# Patient Record
Sex: Female | Born: 1955
Health system: Southern US, Community
[De-identification: ages and names within clinical notes are randomized; demographics above are authoritative.]

## PROBLEM LIST (undated history)

## (undated) DIAGNOSIS — F419 Anxiety disorder, unspecified: Secondary | ICD-10-CM

## (undated) DIAGNOSIS — F329 Major depressive disorder, single episode, unspecified: Secondary | ICD-10-CM

## (undated) DIAGNOSIS — E785 Hyperlipidemia, unspecified: Secondary | ICD-10-CM

## (undated) DIAGNOSIS — K219 Gastro-esophageal reflux disease without esophagitis: Secondary | ICD-10-CM

## (undated) DIAGNOSIS — F32A Depression, unspecified: Secondary | ICD-10-CM

## (undated) DIAGNOSIS — I10 Essential (primary) hypertension: Secondary | ICD-10-CM

## (undated) HISTORY — DX: Depression, unspecified: F32.A

## (undated) HISTORY — DX: Hyperlipidemia, unspecified: E78.5

## (undated) HISTORY — DX: Major depressive disorder, single episode, unspecified: F32.9

## (undated) HISTORY — DX: Gastro-esophageal reflux disease without esophagitis: K21.9

## (undated) HISTORY — DX: Anxiety disorder, unspecified: F41.9

## (undated) HISTORY — DX: Morbid (severe) obesity due to excess calories: E66.01

## (undated) HISTORY — DX: Essential (primary) hypertension: I10

---

## 1998-02-04 ENCOUNTER — Ambulatory Visit (HOSPITAL_COMMUNITY): Admission: RE | Admit: 1998-02-04 | Discharge: 1998-02-04 | Payer: Self-pay | Admitting: Family Medicine

## 1998-05-31 ENCOUNTER — Emergency Department (HOSPITAL_COMMUNITY): Admission: EM | Admit: 1998-05-31 | Discharge: 1998-05-31 | Payer: Self-pay | Admitting: Emergency Medicine

## 1998-11-09 ENCOUNTER — Encounter: Admission: RE | Admit: 1998-11-09 | Discharge: 1998-12-07 | Payer: Self-pay | Admitting: Internal Medicine

## 1999-01-07 ENCOUNTER — Encounter: Payer: Self-pay | Admitting: Neurosurgery

## 1999-01-08 ENCOUNTER — Inpatient Hospital Stay (HOSPITAL_COMMUNITY): Admission: RE | Admit: 1999-01-08 | Discharge: 1999-01-10 | Payer: Self-pay | Admitting: Neurosurgery

## 1999-01-08 ENCOUNTER — Encounter: Payer: Self-pay | Admitting: Neurosurgery

## 1999-04-03 ENCOUNTER — Ambulatory Visit (HOSPITAL_COMMUNITY): Admission: RE | Admit: 1999-04-03 | Discharge: 1999-04-03 | Payer: Self-pay | Admitting: Neurosurgery

## 1999-04-03 ENCOUNTER — Encounter: Payer: Self-pay | Admitting: Neurosurgery

## 1999-04-09 ENCOUNTER — Encounter: Admission: RE | Admit: 1999-04-09 | Discharge: 1999-07-08 | Payer: Self-pay | Admitting: *Deleted

## 1999-07-01 ENCOUNTER — Encounter: Admission: RE | Admit: 1999-07-01 | Discharge: 1999-07-01 | Payer: Self-pay | Admitting: Obstetrics

## 1999-07-28 ENCOUNTER — Encounter: Payer: Self-pay | Admitting: Obstetrics

## 1999-07-28 ENCOUNTER — Ambulatory Visit (HOSPITAL_COMMUNITY): Admission: RE | Admit: 1999-07-28 | Discharge: 1999-07-28 | Payer: Self-pay

## 1999-09-09 ENCOUNTER — Encounter: Admission: RE | Admit: 1999-09-09 | Discharge: 1999-09-09 | Payer: Self-pay | Admitting: Obstetrics

## 1999-09-28 ENCOUNTER — Ambulatory Visit (HOSPITAL_COMMUNITY): Admission: RE | Admit: 1999-09-28 | Discharge: 1999-09-28 | Payer: Self-pay | Admitting: Neurosurgery

## 1999-09-28 ENCOUNTER — Encounter: Payer: Self-pay | Admitting: Neurosurgery

## 1999-10-28 ENCOUNTER — Encounter: Payer: Self-pay | Admitting: Neurosurgery

## 1999-10-28 ENCOUNTER — Ambulatory Visit (HOSPITAL_COMMUNITY): Admission: RE | Admit: 1999-10-28 | Discharge: 1999-10-28 | Payer: Self-pay | Admitting: Neurosurgery

## 1999-12-01 ENCOUNTER — Ambulatory Visit (HOSPITAL_COMMUNITY): Admission: RE | Admit: 1999-12-01 | Discharge: 1999-12-03 | Payer: Self-pay | Admitting: Neurosurgery

## 1999-12-01 ENCOUNTER — Encounter: Payer: Self-pay | Admitting: Neurosurgery

## 1999-12-28 ENCOUNTER — Encounter: Admission: RE | Admit: 1999-12-28 | Discharge: 2000-03-27 | Payer: Self-pay | Admitting: Family Medicine

## 2000-01-24 ENCOUNTER — Other Ambulatory Visit: Admission: RE | Admit: 2000-01-24 | Discharge: 2000-01-24 | Payer: Self-pay | Admitting: Obstetrics and Gynecology

## 2000-05-13 ENCOUNTER — Emergency Department (HOSPITAL_COMMUNITY): Admission: EM | Admit: 2000-05-13 | Discharge: 2000-05-13 | Payer: Self-pay | Admitting: Emergency Medicine

## 2000-05-30 ENCOUNTER — Encounter: Payer: Self-pay | Admitting: Neurosurgery

## 2000-05-30 ENCOUNTER — Ambulatory Visit (HOSPITAL_COMMUNITY): Admission: RE | Admit: 2000-05-30 | Discharge: 2000-05-30 | Payer: Self-pay | Admitting: Neurosurgery

## 2000-06-21 ENCOUNTER — Encounter: Admission: RE | Admit: 2000-06-21 | Discharge: 2000-06-26 | Payer: Self-pay | Admitting: Neurosurgery

## 2000-08-15 ENCOUNTER — Encounter: Admission: RE | Admit: 2000-08-15 | Discharge: 2000-11-13 | Payer: Self-pay | Admitting: Family Medicine

## 2000-09-24 ENCOUNTER — Encounter: Payer: Self-pay | Admitting: Emergency Medicine

## 2000-09-24 ENCOUNTER — Inpatient Hospital Stay (HOSPITAL_COMMUNITY): Admission: EM | Admit: 2000-09-24 | Discharge: 2000-09-24 | Payer: Self-pay | Admitting: Emergency Medicine

## 2000-11-28 HISTORY — PX: CERVICAL DISCECTOMY: SHX98

## 2001-07-29 ENCOUNTER — Emergency Department (HOSPITAL_COMMUNITY): Admission: EM | Admit: 2001-07-29 | Discharge: 2001-07-29 | Payer: Self-pay | Admitting: *Deleted

## 2002-04-10 ENCOUNTER — Ambulatory Visit (HOSPITAL_COMMUNITY): Admission: RE | Admit: 2002-04-10 | Discharge: 2002-04-10 | Payer: Self-pay | Admitting: Internal Medicine

## 2002-04-10 ENCOUNTER — Encounter: Payer: Self-pay | Admitting: Internal Medicine

## 2004-05-08 ENCOUNTER — Emergency Department (HOSPITAL_COMMUNITY): Admission: EM | Admit: 2004-05-08 | Discharge: 2004-05-08 | Payer: Self-pay | Admitting: Emergency Medicine

## 2004-08-04 ENCOUNTER — Ambulatory Visit: Payer: Self-pay | Admitting: Internal Medicine

## 2004-08-11 ENCOUNTER — Ambulatory Visit: Payer: Self-pay | Admitting: Internal Medicine

## 2004-08-25 ENCOUNTER — Ambulatory Visit: Payer: Self-pay | Admitting: Internal Medicine

## 2004-09-07 ENCOUNTER — Ambulatory Visit: Payer: Self-pay | Admitting: Internal Medicine

## 2004-09-08 ENCOUNTER — Ambulatory Visit (HOSPITAL_COMMUNITY): Admission: RE | Admit: 2004-09-08 | Discharge: 2004-09-08 | Payer: Self-pay | Admitting: Pulmonary Disease

## 2004-10-07 ENCOUNTER — Emergency Department (HOSPITAL_COMMUNITY): Admission: EM | Admit: 2004-10-07 | Discharge: 2004-10-07 | Payer: Self-pay | Admitting: Family Medicine

## 2004-12-27 ENCOUNTER — Emergency Department (HOSPITAL_COMMUNITY): Admission: EM | Admit: 2004-12-27 | Discharge: 2004-12-27 | Payer: Self-pay | Admitting: Family Medicine

## 2005-01-06 ENCOUNTER — Ambulatory Visit (HOSPITAL_COMMUNITY): Admission: RE | Admit: 2005-01-06 | Discharge: 2005-01-06 | Payer: Self-pay | Admitting: Internal Medicine

## 2005-01-06 ENCOUNTER — Ambulatory Visit: Payer: Self-pay | Admitting: Internal Medicine

## 2005-01-14 ENCOUNTER — Ambulatory Visit (HOSPITAL_COMMUNITY): Admission: RE | Admit: 2005-01-14 | Discharge: 2005-01-14 | Payer: Self-pay | Admitting: Internal Medicine

## 2005-01-20 ENCOUNTER — Ambulatory Visit: Payer: Self-pay | Admitting: Internal Medicine

## 2006-02-08 ENCOUNTER — Emergency Department (HOSPITAL_COMMUNITY): Admission: EM | Admit: 2006-02-08 | Discharge: 2006-02-09 | Payer: Self-pay | Admitting: Emergency Medicine

## 2006-02-08 ENCOUNTER — Ambulatory Visit: Payer: Self-pay | Admitting: Internal Medicine

## 2006-02-10 ENCOUNTER — Ambulatory Visit: Payer: Self-pay | Admitting: Internal Medicine

## 2006-02-13 ENCOUNTER — Ambulatory Visit (HOSPITAL_COMMUNITY): Admission: RE | Admit: 2006-02-13 | Discharge: 2006-02-13 | Payer: Self-pay | Admitting: Internal Medicine

## 2006-02-13 ENCOUNTER — Encounter (INDEPENDENT_AMBULATORY_CARE_PROVIDER_SITE_OTHER): Payer: Self-pay | Admitting: Pulmonary Disease

## 2006-02-14 ENCOUNTER — Ambulatory Visit (HOSPITAL_COMMUNITY): Admission: RE | Admit: 2006-02-14 | Discharge: 2006-02-14 | Payer: Self-pay | Admitting: Internal Medicine

## 2006-02-16 ENCOUNTER — Ambulatory Visit: Payer: Self-pay | Admitting: Hospitalist

## 2006-02-20 ENCOUNTER — Ambulatory Visit (HOSPITAL_COMMUNITY): Admission: RE | Admit: 2006-02-20 | Discharge: 2006-02-20 | Payer: Self-pay | Admitting: Internal Medicine

## 2006-03-10 ENCOUNTER — Ambulatory Visit: Payer: Self-pay | Admitting: Internal Medicine

## 2006-03-17 ENCOUNTER — Ambulatory Visit: Payer: Self-pay | Admitting: Hospitalist

## 2006-03-21 ENCOUNTER — Ambulatory Visit: Payer: Self-pay | Admitting: Internal Medicine

## 2006-06-01 ENCOUNTER — Ambulatory Visit: Payer: Self-pay | Admitting: Internal Medicine

## 2006-09-25 DIAGNOSIS — K449 Diaphragmatic hernia without obstruction or gangrene: Secondary | ICD-10-CM | POA: Insufficient documentation

## 2006-09-25 DIAGNOSIS — R1319 Other dysphagia: Secondary | ICD-10-CM | POA: Insufficient documentation

## 2006-09-25 DIAGNOSIS — J4540 Moderate persistent asthma, uncomplicated: Secondary | ICD-10-CM

## 2006-09-25 DIAGNOSIS — K219 Gastro-esophageal reflux disease without esophagitis: Secondary | ICD-10-CM | POA: Insufficient documentation

## 2006-09-25 DIAGNOSIS — J45998 Other asthma: Secondary | ICD-10-CM | POA: Insufficient documentation

## 2006-09-25 DIAGNOSIS — I10 Essential (primary) hypertension: Secondary | ICD-10-CM | POA: Insufficient documentation

## 2006-09-25 DIAGNOSIS — J329 Chronic sinusitis, unspecified: Secondary | ICD-10-CM | POA: Insufficient documentation

## 2006-09-25 DIAGNOSIS — J309 Allergic rhinitis, unspecified: Secondary | ICD-10-CM | POA: Insufficient documentation

## 2006-09-25 DIAGNOSIS — E7800 Pure hypercholesterolemia, unspecified: Secondary | ICD-10-CM | POA: Insufficient documentation

## 2006-09-25 DIAGNOSIS — F329 Major depressive disorder, single episode, unspecified: Secondary | ICD-10-CM | POA: Insufficient documentation

## 2006-11-13 ENCOUNTER — Ambulatory Visit: Payer: Self-pay | Admitting: Internal Medicine

## 2006-11-27 ENCOUNTER — Ambulatory Visit: Payer: Self-pay | Admitting: Internal Medicine

## 2007-02-27 ENCOUNTER — Telehealth (INDEPENDENT_AMBULATORY_CARE_PROVIDER_SITE_OTHER): Payer: Self-pay | Admitting: *Deleted

## 2007-03-08 ENCOUNTER — Encounter (INDEPENDENT_AMBULATORY_CARE_PROVIDER_SITE_OTHER): Payer: Self-pay | Admitting: Pulmonary Disease

## 2007-03-08 ENCOUNTER — Encounter (INDEPENDENT_AMBULATORY_CARE_PROVIDER_SITE_OTHER): Payer: Self-pay | Admitting: *Deleted

## 2007-03-08 ENCOUNTER — Ambulatory Visit: Payer: Self-pay | Admitting: Internal Medicine

## 2007-03-08 LAB — CONVERTED CEMR LAB
BUN: 20 mg/dL (ref 6–23)
Chloride: 103 meq/L (ref 96–112)
Glucose, Bld: 125 mg/dL — ABNORMAL HIGH (ref 70–99)
Sodium: 140 meq/L (ref 135–145)

## 2007-03-12 ENCOUNTER — Encounter: Payer: Self-pay | Admitting: *Deleted

## 2007-03-12 ENCOUNTER — Ambulatory Visit (HOSPITAL_COMMUNITY): Admission: RE | Admit: 2007-03-12 | Discharge: 2007-03-12 | Payer: Self-pay | Admitting: Internal Medicine

## 2007-03-22 ENCOUNTER — Ambulatory Visit: Payer: Self-pay | Admitting: Internal Medicine

## 2007-04-02 ENCOUNTER — Encounter (INDEPENDENT_AMBULATORY_CARE_PROVIDER_SITE_OTHER): Payer: Self-pay | Admitting: Pulmonary Disease

## 2007-04-02 ENCOUNTER — Ambulatory Visit (HOSPITAL_BASED_OUTPATIENT_CLINIC_OR_DEPARTMENT_OTHER): Admission: RE | Admit: 2007-04-02 | Discharge: 2007-04-02 | Payer: Self-pay | Admitting: Pulmonary Disease

## 2007-04-07 ENCOUNTER — Ambulatory Visit: Payer: Self-pay | Admitting: Internal Medicine

## 2007-04-12 ENCOUNTER — Telehealth: Payer: Self-pay | Admitting: *Deleted

## 2007-04-27 ENCOUNTER — Telehealth (INDEPENDENT_AMBULATORY_CARE_PROVIDER_SITE_OTHER): Payer: Self-pay | Admitting: Pulmonary Disease

## 2007-05-22 ENCOUNTER — Telehealth: Payer: Self-pay | Admitting: *Deleted

## 2007-06-03 ENCOUNTER — Emergency Department (HOSPITAL_COMMUNITY): Admission: EM | Admit: 2007-06-03 | Discharge: 2007-06-03 | Payer: Self-pay | Admitting: Emergency Medicine

## 2007-08-15 ENCOUNTER — Ambulatory Visit: Payer: Self-pay | Admitting: Internal Medicine

## 2007-08-15 ENCOUNTER — Encounter (INDEPENDENT_AMBULATORY_CARE_PROVIDER_SITE_OTHER): Payer: Self-pay | Admitting: Internal Medicine

## 2007-08-15 DIAGNOSIS — N898 Other specified noninflammatory disorders of vagina: Secondary | ICD-10-CM | POA: Insufficient documentation

## 2007-08-15 LAB — CONVERTED CEMR LAB
Gardnerella vaginalis: NEGATIVE
Trichomonal Vaginitis: NEGATIVE

## 2007-08-29 ENCOUNTER — Ambulatory Visit: Payer: Self-pay | Admitting: Hospitalist

## 2007-08-29 ENCOUNTER — Encounter (INDEPENDENT_AMBULATORY_CARE_PROVIDER_SITE_OTHER): Payer: Self-pay | Admitting: Internal Medicine

## 2007-08-30 LAB — CONVERTED CEMR LAB
AST: 18 units/L (ref 0–37)
BUN: 18 mg/dL (ref 6–23)
Chloride: 104 meq/L (ref 96–112)
Cholesterol: 201 mg/dL — ABNORMAL HIGH (ref 0–200)
HCT: 33 % — ABNORMAL LOW (ref 36.0–46.0)
Hemoglobin: 11.1 g/dL — ABNORMAL LOW (ref 12.0–15.0)
Platelets: 351 10*3/uL (ref 150–400)
Potassium: 3.7 meq/L (ref 3.5–5.3)
RBC: 4.08 M/uL (ref 3.87–5.11)
Total Bilirubin: 0.6 mg/dL (ref 0.3–1.2)
Total CHOL/HDL Ratio: 2.5
Total Protein: 7.9 g/dL (ref 6.0–8.3)
Triglycerides: 61 mg/dL (ref <150)
VLDL: 12 mg/dL (ref 0–40)
WBC: 4.9 10*3/uL (ref 4.0–10.5)

## 2007-09-17 ENCOUNTER — Ambulatory Visit: Payer: Self-pay | Admitting: *Deleted

## 2007-09-17 ENCOUNTER — Encounter (INDEPENDENT_AMBULATORY_CARE_PROVIDER_SITE_OTHER): Payer: Self-pay | Admitting: Internal Medicine

## 2007-09-17 DIAGNOSIS — D649 Anemia, unspecified: Secondary | ICD-10-CM | POA: Insufficient documentation

## 2007-09-17 LAB — CONVERTED CEMR LAB
Ferritin: 53 ng/mL (ref 10–291)
RBC Folate: 337 ng/mL (ref 180–600)
Saturation Ratios: 13 % — ABNORMAL LOW (ref 20–55)
UIBC: 302 ug/dL
Vitamin B-12: 1423 pg/mL — ABNORMAL HIGH (ref 211–911)

## 2007-11-15 ENCOUNTER — Telehealth: Payer: Self-pay | Admitting: *Deleted

## 2007-12-14 ENCOUNTER — Telehealth (INDEPENDENT_AMBULATORY_CARE_PROVIDER_SITE_OTHER): Payer: Self-pay | Admitting: Pharmacy Technician

## 2007-12-19 ENCOUNTER — Telehealth (INDEPENDENT_AMBULATORY_CARE_PROVIDER_SITE_OTHER): Payer: Self-pay | Admitting: Internal Medicine

## 2008-02-19 ENCOUNTER — Telehealth (INDEPENDENT_AMBULATORY_CARE_PROVIDER_SITE_OTHER): Payer: Self-pay | Admitting: Internal Medicine

## 2008-03-21 ENCOUNTER — Telehealth: Payer: Self-pay | Admitting: *Deleted

## 2008-03-25 ENCOUNTER — Telehealth (INDEPENDENT_AMBULATORY_CARE_PROVIDER_SITE_OTHER): Payer: Self-pay | Admitting: Internal Medicine

## 2008-03-27 ENCOUNTER — Ambulatory Visit (HOSPITAL_COMMUNITY): Admission: RE | Admit: 2008-03-27 | Discharge: 2008-03-27 | Payer: Self-pay | Admitting: Pulmonary Disease

## 2008-04-15 ENCOUNTER — Ambulatory Visit: Payer: Self-pay | Admitting: Internal Medicine

## 2008-04-29 ENCOUNTER — Ambulatory Visit: Payer: Self-pay | Admitting: Internal Medicine

## 2008-04-29 ENCOUNTER — Encounter (INDEPENDENT_AMBULATORY_CARE_PROVIDER_SITE_OTHER): Payer: Self-pay | Admitting: Internal Medicine

## 2008-04-30 LAB — CONVERTED CEMR LAB
ALT: 19 units/L (ref 0–35)
Albumin: 4.2 g/dL (ref 3.5–5.2)
Alkaline Phosphatase: 123 units/L — ABNORMAL HIGH (ref 39–117)
BUN: 20 mg/dL (ref 6–23)
Calcium: 9.7 mg/dL (ref 8.4–10.5)
Cholesterol: 225 mg/dL — ABNORMAL HIGH (ref 0–200)
HDL: 75 mg/dL (ref >39)
Hemoglobin: 11.6 g/dL — ABNORMAL LOW (ref 12.0–15.0)
LDL Cholesterol: 137 mg/dL — ABNORMAL HIGH (ref 0–99)
MCHC: 34.6 g/dL (ref 30.0–36.0)
Potassium: 3.6 meq/L (ref 3.5–5.3)
RBC: 4.17 M/uL (ref 3.87–5.11)
RDW: 13.9 % (ref 11.5–15.5)
Sodium: 142 meq/L (ref 135–145)
TSH: 1.334 microintl units/mL (ref 0.350–5.50)
Total CHOL/HDL Ratio: 3
Total Protein: 8 g/dL (ref 6.0–8.3)
Triglycerides: 65 mg/dL (ref <150)
VLDL: 13 mg/dL (ref 0–40)

## 2008-05-06 ENCOUNTER — Telehealth (INDEPENDENT_AMBULATORY_CARE_PROVIDER_SITE_OTHER): Payer: Self-pay | Admitting: Internal Medicine

## 2008-05-07 ENCOUNTER — Telehealth (INDEPENDENT_AMBULATORY_CARE_PROVIDER_SITE_OTHER): Payer: Self-pay | Admitting: Internal Medicine

## 2008-08-07 ENCOUNTER — Encounter (INDEPENDENT_AMBULATORY_CARE_PROVIDER_SITE_OTHER): Payer: Self-pay | Admitting: Internal Medicine

## 2008-10-20 ENCOUNTER — Telehealth: Payer: Self-pay | Admitting: *Deleted

## 2008-10-21 ENCOUNTER — Telehealth: Payer: Self-pay | Admitting: *Deleted

## 2008-11-05 ENCOUNTER — Telehealth: Payer: Self-pay | Admitting: *Deleted

## 2008-12-01 ENCOUNTER — Telehealth: Payer: Self-pay | Admitting: *Deleted

## 2008-12-30 ENCOUNTER — Telehealth (INDEPENDENT_AMBULATORY_CARE_PROVIDER_SITE_OTHER): Payer: Self-pay | Admitting: Internal Medicine

## 2009-02-18 ENCOUNTER — Ambulatory Visit: Payer: Self-pay | Admitting: Internal Medicine

## 2009-02-18 ENCOUNTER — Encounter (INDEPENDENT_AMBULATORY_CARE_PROVIDER_SITE_OTHER): Payer: Self-pay | Admitting: Internal Medicine

## 2009-04-10 ENCOUNTER — Encounter: Payer: Self-pay | Admitting: Obstetrics & Gynecology

## 2009-04-10 ENCOUNTER — Ambulatory Visit: Payer: Self-pay | Admitting: Obstetrics & Gynecology

## 2009-04-14 ENCOUNTER — Ambulatory Visit: Payer: Self-pay | Admitting: *Deleted

## 2009-04-14 ENCOUNTER — Telehealth (INDEPENDENT_AMBULATORY_CARE_PROVIDER_SITE_OTHER): Payer: Self-pay | Admitting: *Deleted

## 2009-04-14 DIAGNOSIS — R059 Cough, unspecified: Secondary | ICD-10-CM | POA: Insufficient documentation

## 2009-04-14 DIAGNOSIS — R05 Cough: Secondary | ICD-10-CM | POA: Insufficient documentation

## 2009-07-15 ENCOUNTER — Telehealth: Payer: Self-pay | Admitting: *Deleted

## 2009-07-30 ENCOUNTER — Telehealth (INDEPENDENT_AMBULATORY_CARE_PROVIDER_SITE_OTHER): Payer: Self-pay | Admitting: *Deleted

## 2009-08-13 ENCOUNTER — Telehealth (INDEPENDENT_AMBULATORY_CARE_PROVIDER_SITE_OTHER): Payer: Self-pay | Admitting: *Deleted

## 2009-11-13 ENCOUNTER — Telehealth: Payer: Self-pay | Admitting: *Deleted

## 2010-02-03 ENCOUNTER — Ambulatory Visit: Payer: Self-pay | Admitting: Internal Medicine

## 2010-02-03 DIAGNOSIS — D239 Other benign neoplasm of skin, unspecified: Secondary | ICD-10-CM | POA: Insufficient documentation

## 2010-02-03 DIAGNOSIS — J029 Acute pharyngitis, unspecified: Secondary | ICD-10-CM | POA: Insufficient documentation

## 2010-02-03 LAB — CONVERTED CEMR LAB
BUN: 16 mg/dL (ref 6–23)
Calcium: 9.2 mg/dL (ref 8.4–10.5)
Creatinine, Ser: 0.86 mg/dL (ref 0.40–1.20)
Potassium: 3.9 meq/L (ref 3.5–5.3)
Sodium: 143 meq/L (ref 135–145)

## 2010-03-18 ENCOUNTER — Telehealth (INDEPENDENT_AMBULATORY_CARE_PROVIDER_SITE_OTHER): Payer: Self-pay | Admitting: Dermatology

## 2010-11-09 ENCOUNTER — Telehealth: Payer: Self-pay | Admitting: Internal Medicine

## 2010-11-12 ENCOUNTER — Emergency Department (HOSPITAL_COMMUNITY)
Admission: EM | Admit: 2010-11-12 | Discharge: 2010-11-12 | Payer: Self-pay | Source: Home / Self Care | Admitting: Emergency Medicine

## 2010-12-28 NOTE — Progress Notes (Signed)
Summary: refill/gg  Phone Note Refill Request  on March 18, 2010 4:53 PM  Refills Requested: Medication #1:  CELEBREX 100 MG  CAPS Take 1 tablet by mouth once a day   Last Refilled: 02/22/2010  Method Requested: Fax to Local Pharmacy Initial call taken by: Merrie Roof RN,  March 18, 2010 4:53 PM  Follow-up for Phone Call        Rx faxed to pharmacy    Prescriptions: CELEBREX 100 MG  CAPS (CELECOXIB) Take 1 tablet by mouth once a day  #30 x 5   Entered and Authorized by:   Aris Lot MD   Signed by:   Aris Lot MD on 03/19/2010   Method used:   [facility]ed to ...       [facility] [address] [id number]       [address]       [place], [place]  [address]       Ph: [phone]       Fax: [phone]   RxID:   [id number]

## 2010-12-28 NOTE — Assessment & Plan Note (Signed)
Summary: sore throat [mkj]   Vital Signs:  Patient profile:   55 year old female Height:      68 inches (172.72 cm) Weight:      310.5 pounds (141.14 kg) BMI:     47.38 Temp:     97.0 degrees F (36.11 degrees C) oral Pulse rate:   84 / minute BP sitting:   149 / 88  (right arm) Cuff size:   large  Vitals Entered By: Theotis Barrio NT II (February 03, 2010 3:36 PM)  Nutrition Counseling: Patient's BMI is greater than 25 and therefore counseled on weight management options. CC: SORE THROAT -BETTER TODAY   / BLACK SPOT -BOTTOM OF RIGHT FOOT -PALM AREA OF LEFT HAND  /  MEDICATION REFILL Is Patient Diabetic? No Pain Assessment Patient in pain? no      Nutritional Status BMI of > 30 = obese  Have you ever been in a relationship where you felt threatened, hurt or afraid?No   Does patient need assistance? Functional Status Self care Ambulation Normal Comments SORE THROAT- BETTER TODAY THAN YESTERDAY / MEDICATION REIFILL /  BLACK SPOT BOTTOM OF RIGHT FOOT / BLACK SPOT PALM AREA OF LEFT AND   Primary Care Provider:  Carlus Pavlov MD  CC:  SORE THROAT -BETTER TODAY   / BLACK SPOT -BOTTOM OF RIGHT FOOT -PALM AREA OF LEFT HAND  /  MEDICATION REFILL.  History of Present Illness: 55 yo woman with problems as listed on the EMR. In here today for:   1. sore throat: started 3-4 days ago and already getting better. No fever or cough. No problem with speaking or swallowing.  2. skin lesions: she has blackish lesions on palm of left hand and left sole. It looks like a mole and seems to be getting larger.    Depression History:      The patient denies a depressed mood most of the day and a diminished interest in her usual daily activities.         Preventive Screening-Counseling & Management  Alcohol-Tobacco     Smoking Status: never  Caffeine-Diet-Exercise     Does Patient Exercise: no  Current Medications (verified): 1)  Flovent Hfa 110 Mcg/act Aero (Fluticasone Propionate  Hfa)  .... One Puff Two Times A Day 2)  Hydrochlorothiazide 25 Mg Tabs (Hydrochlorothiazide) .... Take 1 Tablet By Mouth Once A Day 3)  Allegra 60 Mg  Tabs (Fexofenadine Hcl) .... Take 1 Tablet By Mouth Once A Day 4)  Flonase  Susp (Fluticasone Propionate Susp) .... Dose/frequency Not in Chart 5)  Nexium 40 Mg Cpdr (Esomeprazole Magnesium) .... Take 1 Tablet By Mouth Once A Day 6)  Tiazac 240 Mg  Cp24 (Diltiazem Hcl Er Beads) .... Take 1 Tablet By Mouth Once A Day 7)  Celebrex 100 Mg  Caps (Celecoxib) .... Take 1 Tablet By Mouth Once A Day 8)  Lisinopril 5 Mg Tabs (Lisinopril) .Marland Kitchen.. 1 Tablet By Mouth Daily 9)  Guaifenesin 200 Mg Tabs (Guaifenesin) .... Take 1 Tab By Mouth Up To Every 6 Hours As Needed Cough  Allergies (verified): 1)  ! Aspirin (Aspirin)  Past History:  Past Medical History: Last updated: 04/14/2009 Anxiety Depression-situational Allergic rhinitis Asthma GERD Hypertension  SBP: 144 (02/18/2009 1:34:02 PM) 146 (04/15/2008 3:43:26 PM) 128 (09/17/2007 2:49:05 PM) 138 (08/15/2007 11:52:01 AM) 131 (03/22/2007 2:30:37 PM)   Sinusitis Obesity Dysphagia Hiatal hernia Skin nodules Hypercholesterolemia  Past Surgical History: Last updated: 09/25/2006 Diskectomy, 2002   Family History: Last  updated: 04/14/2009 F: died of MI at 56 M: died in early 77s, not sure of cause, had HTN 2 brothers: HTN 5 children: eldest daughter with HTN Sickle cell trait in family  Social History: Last updated: 04/14/2009 Lives in [place] in a house with her daughter 82 year old. never smoker no alcohol no drugs  Risk Factors: Exercise: no (02/03/2010)  Risk Factors: Smoking Status: never (02/03/2010)  Review of Systems       as per HPI  Physical Exam  General:  alert and overweight-appearing.   Mouth:  pharynx pink and moist, no erythema, and no exudates.   Neck:  supple.   Lungs:  normal respiratory effort and normal breath sounds.   Heart:  normal rate and regular  rhythm.   Abdomen:  soft and non-tender.   Pulses:  normal peripheral pulses  Extremities:  no cyanosis, clubbing or edema  Neurologic:  non focal.  Psych:  Oriented X3 and normally interactive.     Impression & Recommendations:  Problem # 1:  SORE THROAT (ICD-462) this was likely viral. already improving. exam was negative. I will treat symptomatically.  Her updated medication list for this problem includes:    Celebrex 100 Mg Caps (Celecoxib) .Marland Kitchen... Take 1 tablet by mouth once a day  Problem # 2:  NEVUS (ICD-216.9) Per pt, size is growing. Will make a derm referral.   Orders: Dermatology Referral (Derma)  Problem # 3:  HYPERTENSION (ICD-401.9) High BP noted, patient says that she is taking HCTZ and tiazac, but not lisinopril. I will get a b-met today and ask her to take lisinopril. Will have her restart her lisinorpril, review next time.  Her updated medication list for this problem includes:    Hydrochlorothiazide 25 Mg Tabs (Hydrochlorothiazide) .Marland Kitchen... Take 1 tablet by mouth once a day    Tiazac 240 Mg Cp24 (Diltiazem hcl er beads) .Marland Kitchen... Take 1 tablet by mouth once a day    Lisinopril 5 Mg Tabs (Lisinopril) .Marland Kitchen... 1 tablet by mouth daily  Orders: T-Basic Metabolic Panel [phone])  Problem # 4:  HYPERCHOLESTEROLEMIA (ICD-272.0) Chol: 225 (04/29/2008 8:43:00 PM)HDL:  75 (04/29/2008 8:43:00 PM)LDL:  137 (04/29/2008 8:43:00 PM)Tri:   AST:  22 (04/29/2008 8:43:00 PM) ALT:  19 (04/29/2008 8:43:00 PM)T. Bili:  0.5 (04/29/2008 8:43:00 PM) AP:  123 (04/29/2008 8:43:00 PM)  Data reviewed. She will need repeat FLP on next visit.   Complete Medication List: 1)  Flovent Hfa 110 Mcg/act Aero (Fluticasone propionate  hfa) .... One puff two times a day 2)  Hydrochlorothiazide 25 Mg Tabs (Hydrochlorothiazide) .... Take 1 tablet by mouth once a day 3)  Allegra 60 Mg Tabs (Fexofenadine hcl) .... Take 1 tablet by mouth once a day 4)  Flonase Susp (Fluticasone propionate susp) ....  Dose/frequency not in chart 5)  Nexium 40 Mg Cpdr (Esomeprazole magnesium) .... Take 1 tablet by mouth once a day 6)  Tiazac 240 Mg Cp24 (Diltiazem hcl er beads) .... Take 1 tablet by mouth once a day 7)  Celebrex 100 Mg Caps (Celecoxib) .... Take 1 tablet by mouth once a day 8)  Lisinopril 5 Mg Tabs (Lisinopril) .Marland Kitchen.. 1 tablet by mouth daily 9)  Guaifenesin 200 Mg Tabs (Guaifenesin) .... Take 1 tab by mouth up to every 6 hours as needed cough  Other Orders: Mammogram (Screening) (Mammo)  Patient Instructions: 1)  Please follow up with the skin doctor for your skin problem. 2)  Do let us know if your problem worsens.  3)  We will let you know if anything wrong with your lab work.   4)  Please pick up your meds from your pharmacy, refills have been done.  Prescriptions: LISINOPRIL 5 MG TABS (LISINOPRIL) 1 tablet by mouth daily  #30 x 1   Entered and Authorized by:   Zara Council MD   Signed by:   Zara Council MD on 02/03/2010   Method used:   Print then Give to Patient   RxID:   [id number] TIAZAC 240 MG  CP24 (DILTIAZEM HCL ER BEADS) Take 1 tablet by mouth once a day  #30 x 5   Entered and Authorized by:   Zara Council MD   Signed by:   Zara Council MD on 02/03/2010   Method used:   Print then Give to Patient   RxID:   [id number] HYDROCHLOROTHIAZIDE 25 MG TABS (HYDROCHLOROTHIAZIDE) Take 1 tablet by mouth once a day  #31 x 11   Entered and Authorized by:   Zara Council MD   Signed by:   Zara Council MD on 02/03/2010   Method used:   Print then Give to Patient   RxID:   [id number]  Process Orders Check Orders Results:     [facility]: ABN not required for this insurance Tests Sent for requisitioning (February 03, 2010 8:53 PM):     02/03/2010: [facility] -- T-Basic Metabolic Panel [phone] (signed)   Prevention & Chronic Care Immunizations   Influenza vaccine: Fluvax Non-MCR  (09/17/2007)    Tetanus booster: Not  documented    Pneumococcal vaccine: Not documented  Colorectal Screening   Hemoccult: Not documented    Colonoscopy: Not documented  Other Screening   Pap smear: Not documented    Mammogram: No specific mammographic evidence of malignancy.  Assessment: BIRADS 1.   (03/27/2008)   Mammogram due: 03/2009   Smoking status: never  (02/03/2010)  Lipids   Total Cholesterol: 225  (04/29/2008)   LDL: 137  (04/29/2008)   LDL Direct: Not documented   HDL: 75  (04/29/2008)   Triglycerides: 65  (04/29/2008)    SGOT (AST): 22  (04/29/2008)   SGPT (ALT): 19  (04/29/2008)   Alkaline phosphatase: 123  (04/29/2008)   Total bilirubin: 0.5  (04/29/2008)    Lipid flowsheet reviewed?: Yes   Progress toward LDL goal: Unchanged  Hypertension   Last Blood Pressure: 149 / 88  (02/03/2010)   Serum creatinine: 0.81  (04/29/2008)   Serum potassium 3.6  (04/29/2008)    Hypertension flowsheet reviewed?: Yes   Progress toward BP goal: Unchanged  Self-Management Support :   Personal Goals (by the next clinic visit) :      Personal blood pressure goal: 140/90  (02/03/2010)     Personal LDL goal: 100  (02/03/2010)    Patient will work on the following items until the next clinic visit to reach self-care goals:     Medications and monitoring: take my medicines every day, bring all of my medications to every visit  (02/03/2010)     Eating: eat more vegetables, use fresh or frozen vegetables, eat foods that are low in salt, eat baked foods instead of fried foods, eat fruit for snacks and desserts, limit or avoid alcohol  (02/03/2010)    Hypertension self-management support: Resources for patients handout  (02/03/2010)    Lipid self-management support: Resources for patients handout  (02/03/2010)        Resource handout printed.   Nursing Instructions: Give Flu vaccine today  Process Orders Check Orders Results:     [facility]: ABN not required for this insurance Tests  Sent for requisitioning (February 03, 2010 8:53 PM):     02/03/2010: [facility] -- T-Basic Metabolic Panel [phone] (signed)

## 2010-12-30 NOTE — Progress Notes (Signed)
Summary: Nerves  Phone Note Call from Patient   Caller: Patient Call For: Kayla Abraham DO Summary of Call: Pt's husband died from a heart attack last night from a heart attack.  Needs something for her nerves. Angelina Ok RN  November 09, 2010 12:14 PM  Initial call taken by: Angelina Ok RN,  November 09, 2010 12:14 PM  Follow-up for Phone Call        will write for xanax 0.5mg  by mouth three times a day for limited supply.  Will need eval if more required. Follow-up by: Mariea Stable MD,  November 09, 2010 12:16 PM  Additional Follow-up for Phone Call Additional follow up Details #1::        [facility] [address]  Additional Follow-up by: Angelina Ok RN,  November 09, 2010 1:44 PM    New/Updated Medications: ALPRAZOLAM 0.5 MG TABS (ALPRAZOLAM) Take 1 tablet by mouth three times a day as needed anxiety Prescriptions: ALPRAZOLAM 0.5 MG TABS (ALPRAZOLAM) Take 1 tablet by mouth three times a day as needed anxiety  #60 x 0   Entered and Authorized by:   Mariea Stable MD   Signed by:   Mariea Stable MD on 11/09/2010   Method used:   [facility]phoned to ...       [facility]  [address] [id number]       [address]       [place], [place]  [address]       Ph: [phone]       Fax: [phone]   RxID:   [id number]

## 2011-03-14 ENCOUNTER — Other Ambulatory Visit: Payer: Self-pay | Admitting: *Deleted

## 2011-03-14 NOTE — Telephone Encounter (Signed)
Spoke to pt to ask if she has transferred her care to [place].  Pt sad that she has not .  Pt was advised that she needs to get a Physician in [place] to continue to follow her blood pressure and to prescribe her meds.  Pt said ok.  Plans to do in the near future.

## 2011-03-14 NOTE — Telephone Encounter (Signed)
Kayla Roberts,  This pt hasnt been seen in our clinic for greater than a year, she has moved to [place] and should establish care. Therefore, I cannot fill this medication as I have no way to look at her renal function and electrolytes on this chronic medication. Thus, the refill is denied.   Johnette Abraham, D.O.

## 2011-03-21 ENCOUNTER — Other Ambulatory Visit: Payer: Self-pay | Admitting: *Deleted

## 2011-04-14 ENCOUNTER — Telehealth: Payer: Self-pay | Admitting: *Deleted

## 2011-04-14 ENCOUNTER — Inpatient Hospital Stay (INDEPENDENT_AMBULATORY_CARE_PROVIDER_SITE_OTHER)
Admission: RE | Admit: 2011-04-14 | Discharge: 2011-04-14 | Disposition: A | Discharge status: Home / Self Care | Payer: PRIVATE HEALTH INSURANCE | Source: Ambulatory Visit | Attending: Family Medicine | Admitting: Family Medicine

## 2011-04-14 DIAGNOSIS — I10 Essential (primary) hypertension: Secondary | ICD-10-CM

## 2011-04-14 NOTE — Telephone Encounter (Signed)
I talked with Dr Aundria Rud and he advised pt to go to ED for evaluation, now.  R/O stroke. Pt called and advised, voices understanding.

## 2011-04-14 NOTE — Telephone Encounter (Signed)
New onset focal weakness should be seen promptly.  Go to ER.

## 2011-04-14 NOTE — Telephone Encounter (Signed)
Pt called stating she needs BP pills.  Also c/o pain to back of shoulder blade and left hand.  Onset today.  She has not taken any pain meds.   She states left hand feels weak.  Daughter is with pt and checked grips, states left hand is weaker.  No other problems.  Denies any vision changes, denies speech changes, she is up and ambulatory.  Please advise.

## 2011-04-15 ENCOUNTER — Encounter: Payer: Self-pay | Admitting: Internal Medicine

## 2011-04-15 NOTE — Procedures (Signed)
NAME:  Kayla Roberts, Kayla Roberts NO.:  [id number]   MEDICAL RECORD NO.:  [id number]          PATIENT TYPE:  OUT   LOCATION:  SLEEP CENTER                 FACILITY:  [facility]   PHYSICIAN:  Clinton D. Maple Hudson, MD, FCCP, FACPDATE OF BIRTH:  Mar 27, 1956   DATE OF STUDY:  04/02/2007                            NOCTURNAL POLYSOMNOGRAM   REFERRING PHYSICIAN:  Vanetta Mulders, MD   SLEEP STUDY REPORT:   INDICATION FOR STUDY:  Hypersomnia with sleep apnea.   EPWORTH SLEEPINESS SCORE:  13/24   BMI 44.6. Weight 295 pounds.   HOME MEDICATIONS:  Are listed and reviewed.   SLEEP ARCHITECTURE:  Total sleep time 356 minutes with sleep efficiency  80%.  Stage 1 is 5%, stage 2, 72%. Stages 3 and 4, 4%, REM 19% 19% of  total sleep time.  Sleep latency 14 minutes, REM latency 56 minutes,  awake after sleep on 79 minutes, arousal index 15.5.  no bedtime  medication reported.   RESPIRATORY DATA:  Blood study protocol.  Apnea hypopnea index (AHI,  RDI) 86.5 obstructive events/hr., indicating severe obstructive sleep  apnea/hypopnea syndrome before CPAP.  There were 148 obstructive apneas  and 38 hypomanias before CPAP.  Events were not positional.  REM HI  41.5.  CPAP was titrated to 17 CWP, AHI 0/hr.  A medium ResMed Quattro  mask was used with heated humidifier.   OXYGEN DATA:  Moderate snoring, occasionally loud, with oxygen  desaturation to a Nadir of 53%.  Oxygen saturation after CPAP control  was 93-94% on room air.   CARDIAC DATA:  Sinus rhythm with sinus arrhythmia, sinus pauses and  PAC's.   MOVEMENT/PARASOMNIA:  Occasional limb jerk with little affect on sleep.   IMPRESSION/RECOMMENDATION:  1. Severe obstructive sleep apnea/hypopnea syndrome, AHI 86.5/hr.,      with non-positional events, moderate to occasional loud snoring and      oxygen desaturation transiently to a Nadir of 53%.  2. Successful CPAP titration to 17 CWP, AHI 0/hr.  A medium ResMed      Quattro full face mask was  used with      heated humidifier.  3. Frequent PAC's, sinus pauses and sinus arrhythmia reflecting vagal      stimulation with apneas.      Clinton D. Maple Hudson, MD, [facility], FACP  Diplomate, Biomedical engineer of Sleep Medicine  Electronically Signed     CDY/MEDQ  D:  04/08/2007 12:35:05  T:  04/09/2007 08:53:47  Job:  [id number]

## 2011-06-06 ENCOUNTER — Encounter: Payer: Self-pay | Admitting: Internal Medicine

## 2012-10-23 ENCOUNTER — Encounter (HOSPITAL_COMMUNITY): Payer: Self-pay

## 2012-10-23 ENCOUNTER — Emergency Department (HOSPITAL_COMMUNITY)
Admission: EM | Admit: 2012-10-23 | Discharge: 2012-10-23 | Disposition: A | Discharge status: Home / Self Care | Payer: Self-pay | Attending: Emergency Medicine | Admitting: Emergency Medicine

## 2012-10-23 ENCOUNTER — Emergency Department (HOSPITAL_COMMUNITY): Payer: Self-pay

## 2012-10-23 DIAGNOSIS — J45909 Unspecified asthma, uncomplicated: Secondary | ICD-10-CM | POA: Insufficient documentation

## 2012-10-23 DIAGNOSIS — S4980XA Other specified injuries of shoulder and upper arm, unspecified arm, initial encounter: Secondary | ICD-10-CM | POA: Insufficient documentation

## 2012-10-23 DIAGNOSIS — S39012A Strain of muscle, fascia and tendon of lower back, initial encounter: Secondary | ICD-10-CM

## 2012-10-23 DIAGNOSIS — Z8659 Personal history of other mental and behavioral disorders: Secondary | ICD-10-CM | POA: Insufficient documentation

## 2012-10-23 DIAGNOSIS — E785 Hyperlipidemia, unspecified: Secondary | ICD-10-CM | POA: Insufficient documentation

## 2012-10-23 DIAGNOSIS — Z8719 Personal history of other diseases of the digestive system: Secondary | ICD-10-CM | POA: Insufficient documentation

## 2012-10-23 DIAGNOSIS — M25512 Pain in left shoulder: Secondary | ICD-10-CM

## 2012-10-23 DIAGNOSIS — R296 Repeated falls: Secondary | ICD-10-CM | POA: Insufficient documentation

## 2012-10-23 DIAGNOSIS — Z9889 Other specified postprocedural states: Secondary | ICD-10-CM | POA: Insufficient documentation

## 2012-10-23 DIAGNOSIS — Z79899 Other long term (current) drug therapy: Secondary | ICD-10-CM | POA: Insufficient documentation

## 2012-10-23 DIAGNOSIS — Y9229 Other specified public building as the place of occurrence of the external cause: Secondary | ICD-10-CM | POA: Insufficient documentation

## 2012-10-23 DIAGNOSIS — S7000XA Contusion of unspecified hip, initial encounter: Secondary | ICD-10-CM | POA: Insufficient documentation

## 2012-10-23 DIAGNOSIS — S46909A Unspecified injury of unspecified muscle, fascia and tendon at shoulder and upper arm level, unspecified arm, initial encounter: Secondary | ICD-10-CM | POA: Insufficient documentation

## 2012-10-23 DIAGNOSIS — S7002XA Contusion of left hip, initial encounter: Secondary | ICD-10-CM

## 2012-10-23 DIAGNOSIS — IMO0002 Reserved for concepts with insufficient information to code with codable children: Secondary | ICD-10-CM | POA: Insufficient documentation

## 2012-10-23 DIAGNOSIS — Y9389 Activity, other specified: Secondary | ICD-10-CM | POA: Insufficient documentation

## 2012-10-23 DIAGNOSIS — I10 Essential (primary) hypertension: Secondary | ICD-10-CM | POA: Insufficient documentation

## 2012-10-23 DIAGNOSIS — S335XXA Sprain of ligaments of lumbar spine, initial encounter: Secondary | ICD-10-CM | POA: Insufficient documentation

## 2012-10-23 DIAGNOSIS — R51 Headache: Secondary | ICD-10-CM | POA: Insufficient documentation

## 2012-10-23 MED ORDER — OXYCODONE-ACETAMINOPHEN 5-325 MG PO TABS: 1.0000 | ORAL_TABLET | ORAL | Status: DC | PRN

## 2012-10-23 MED ORDER — OXYCODONE-ACETAMINOPHEN 5-325 MG PO TABS
1.0000 | ORAL_TABLET | Freq: Once | ORAL | Status: AC
  Administered 2012-10-23: 1 via ORAL
  Filled 2012-10-23: qty 1

## 2012-10-23 NOTE — ED Notes (Signed)
Pt states she hit her head when she fell it did not hurt much at first but suddenly the pain is 10/10 on pain scale. Pt was unable to ambulate after fall.
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
## 2012-10-23 NOTE — ED Provider Notes (Signed)
Medical screening examination/treatment/procedure(s) were performed by non-physician practitioner and as supervising physician I was immediately available for consultation/collaboration.  Olivia Mackie, MD 10/23/12 [phone]
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
## 2014-05-08 ENCOUNTER — Ambulatory Visit: Payer: Self-pay

## 2014-05-08 ENCOUNTER — Ambulatory Visit (INDEPENDENT_AMBULATORY_CARE_PROVIDER_SITE_OTHER): Payer: Self-pay | Admitting: Physician Assistant

## 2014-05-08 VITALS — BP 124/78 | HR 79 | Temp 98.7°F | Resp 18 | Ht 66.0 in | Wt 305.0 lb

## 2014-05-08 DIAGNOSIS — Z111 Encounter for screening for respiratory tuberculosis: Secondary | ICD-10-CM

## 2014-05-08 NOTE — Progress Notes (Signed)
   Subjective:    Patient ID: Kayla Roberts, female    DOB: 04/25/1956, 58 y.o.   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  She had a positive PPD in the past but has had a neg CXR 3 years ago at a different facility.  She was sent here from work.   Tuberculosis Risk Questionnaire  1. Yes ([place]) Were you born outside the [place] in one of the following parts of the world: [place], [place], [place], [place] or [place]?    2. No Have you traveled outside the [place] and lived for more than one month in one of the following parts of the world: [place], [place], [place], [place] or [place]?    3. No Do you have a compromised immune system such as from any of the following conditions:HIV/AIDS, organ or bone marrow transplantation, diabetes, immunosuppressive medicines (e.g. Prednisone, Remicaide), leukemia, lymphoma, cancer of the head or neck, gastrectomy or jejunal bypass, end-stage renal disease (on dialysis), or silicosis?     4. Yes (home health) Have you ever or do you plan on working in: a residential care center, a health care facility, a jail or prison or homeless shelter?    5. No Have you ever: injected illegal drugs, used crack cocaine, lived in a homeless shelter  or been in jail or prison?     6. No Have you ever been exposed to anyone with infectious tuberculosis?    Tuberculosis Symptom Questionnaire  Do you currently have any of the following symptoms?  1. No Unexplained cough lasting more than 3 weeks?   2. No Unexplained fever lasting more than 3 weeks.   3. No Night Sweats (sweating that leaves the bedclothes and sheets wet)     4. No Shortness of Breath   5. No Chest Pain   6. No Unintentional weight loss    7. No Unexplained fatigue (very tired for no reason)     Review of Systems     Objective:   Physical Exam        Assessment & Plan:  After discussing with patient the  recommendation of CXR every 10 years with a positive PPD and no symptoms.  After discussion she plans to go to the other facility for the cxr results for her job.

## 2014-05-10 ENCOUNTER — Ambulatory Visit (INDEPENDENT_AMBULATORY_CARE_PROVIDER_SITE_OTHER): Payer: Self-pay | Admitting: Family Medicine

## 2014-05-10 ENCOUNTER — Ambulatory Visit (INDEPENDENT_AMBULATORY_CARE_PROVIDER_SITE_OTHER): Payer: Self-pay

## 2014-05-10 VITALS — BP 124/80 | HR 79 | Temp 97.9°F | Resp 18 | Ht 66.0 in | Wt 305.0 lb

## 2014-05-10 DIAGNOSIS — Z111 Encounter for screening for respiratory tuberculosis: Secondary | ICD-10-CM

## 2014-05-10 NOTE — Progress Notes (Signed)
   Subjective:    Patient ID: Kayla Roberts, female    DOB: 09-18-1956, 58 y.o.   MRN: [id number]  HPI 58 year old female presents for CXR for her job.  Has a hx of +PPD. Had a negative CXR in 2010. She took this report to her employer who requested an updated x-ray. She is asymptomatic.   See TB screening questionnaire in last OV.      Review of Systems  Constitutional: Negative for fever, chills and unexpected weight change.  Respiratory: Negative for cough.   Cardiovascular: Negative for chest pain.       Objective:   Physical Exam  Constitutional: She is oriented to person, place, and time. She appears well-developed and well-nourished.  HENT:  Head: Normocephalic and atraumatic.  Eyes: Conjunctivae are normal.  Neck: Normal range of motion.  Cardiovascular: Normal rate.   Pulmonary/Chest: Effort normal.  Neurological: She is alert and oriented to person, place, and time.  Psychiatric: She has a normal mood and affect. Her behavior is normal. Judgment and thought content normal.     [facility] reading (PRIMARY) by  Dr. Lorelei Pont as no evidence of active TB.       Assessment & Plan:  Screening for tuberculosis - Plan: DG Chest 1 View  Negative CXR today.  Letter printed for patient Follow up as needed.

## 2016-02-02 ENCOUNTER — Ambulatory Visit (INDEPENDENT_AMBULATORY_CARE_PROVIDER_SITE_OTHER): Payer: Self-pay | Admitting: Family Medicine

## 2016-02-02 VITALS — BP 146/80 | HR 80 | Temp 97.8°F | Resp 16 | Ht 66.0 in | Wt 319.6 lb

## 2016-02-02 DIAGNOSIS — I10 Essential (primary) hypertension: Secondary | ICD-10-CM

## 2016-02-02 DIAGNOSIS — M1710 Unilateral primary osteoarthritis, unspecified knee: Secondary | ICD-10-CM

## 2016-02-02 LAB — LIPID PANEL
CHOL/HDL RATIO: 2.1 ratio (ref <=5.0)
CHOLESTEROL: 175 mg/dL (ref 125–200)
HDL: 84 mg/dL (ref >=46)
LDL CALC: 79 mg/dL (ref <130)
TRIGLYCERIDES: 61 mg/dL (ref <150)
VLDL: 12 mg/dL (ref <30)

## 2016-02-02 LAB — CBC
HEMATOCRIT: 34.7 % — AB (ref 36.0–46.0)
HEMOGLOBIN: 11.5 g/dL — AB (ref 12.0–15.0)
MCH: 27.2 pg (ref 26.0–34.0)
MCHC: 33.1 g/dL (ref 30.0–36.0)
MCV: 82 fL (ref 78.0–100.0)
MPV: 8.9 fL (ref 8.6–12.4)
Platelets: 260 10*3/uL (ref 150–400)
RBC: 4.23 MIL/uL (ref 3.87–5.11)
RDW: 15 % (ref 11.5–15.5)
WBC: 4.1 10*3/uL (ref 4.0–10.5)

## 2016-02-02 LAB — COMPREHENSIVE METABOLIC PANEL
ALBUMIN: 3.9 g/dL (ref 3.6–5.1)
ALT: 17 U/L (ref 6–29)
AST: 25 U/L (ref 10–35)
Alkaline Phosphatase: 116 U/L (ref 33–130)
BUN: 13 mg/dL (ref 7–25)
CALCIUM: 9.4 mg/dL (ref 8.6–10.4)
CHLORIDE: 103 mmol/L (ref 98–110)
CO2: 30 mmol/L (ref 20–31)
Creat: 0.7 mg/dL (ref 0.50–0.99)
Glucose, Bld: 75 mg/dL (ref 65–99)
POTASSIUM: 3.7 mmol/L (ref 3.5–5.3)
Sodium: 140 mmol/L (ref 135–146)
TOTAL PROTEIN: 7.4 g/dL (ref 6.1–8.1)
Total Bilirubin: 0.6 mg/dL (ref 0.2–1.2)

## 2016-02-02 MED ORDER — HYDROCHLOROTHIAZIDE 25 MG PO TABS: 25.0000 mg | ORAL_TABLET | Freq: Every day | ORAL | Status: DC

## 2016-02-02 MED ORDER — CELECOXIB 200 MG PO CAPS: 200.0000 mg | ORAL_CAPSULE | Freq: Two times a day (BID) | ORAL | Status: DC

## 2016-02-02 NOTE — Progress Notes (Signed)
Kayla Roberts is a 60 y.o. female who presents today for HTN and arthritis.  HTN - Ongoing HTN for several years now.  Unsure of last lab work but has been on HCTZ for years now w/o SE including palpitations, SOB/HA/blurred vision/diplopia.   Arthritis - B/L Knee OA, celebrex daily which does help.  No no injury or Sx.  Previously has had standing Knee x-rays performed in [place] but does not have the records with her.    Past Medical History  Diagnosis Date  . Anxiety   . Depression   . Asthma   . GERD (gastroesophageal reflux disease)   . Hypertension   . Hyperlipidemia     History  Smoking status  . Never Smoker   Smokeless tobacco  . Not on file    Family History  Problem Relation Age of Onset  . Hypertension Mother   . Hyperlipidemia Mother   . Heart disease Father   . Hyperlipidemia Father   . Hypertension Father   . Hypertension Brother   . Heart disease Brother   . Hypertension Daughter   . Diabetes Sister     No current outpatient prescriptions on file prior to visit.   No current facility-administered medications on file prior to visit.    ROS: Per HPI.  All other systems reviewed and are negative.   Physical Exam Filed Vitals:   02/02/16 1436 02/02/16 1459  BP: 146/86 146/80  Pulse: 80   Temp: 97.8 F (36.6 C)   Resp: 16     Physical Examination: General appearance - alert, well appearing, and in no distress Neck - supple, no significant adenopathy Heart - normal rate, regular rhythm, normal S1, S2, no murmurs, rubs, clicks or gallops    Chemistry      Component Value Date/Time   NA 143 02/03/2010 2051   K 3.9 02/03/2010 2051   CL 104 02/03/2010 2051   CO2 26 02/03/2010 2051   BUN 16 02/03/2010 2051   CREATININE 0.86 02/03/2010 2051      Component Value Date/Time   CALCIUM 9.2 02/03/2010 2051   ALKPHOS 123* 04/29/2008 2043   AST 22 04/29/2008 2043   ALT 19 04/29/2008 2043   BILITOT 0.5 04/29/2008 2043      Lab Results  Component  Value Date   WBC 4.2 04/29/2008   HGB 11.6* 04/29/2008   HCT 33.5* 04/29/2008   MCV 80.3 04/29/2008   PLT 282 04/29/2008   Lab Results  Component Value Date   TSH 1.334 04/29/2008   No results found for: HGBA1C

## 2016-02-02 NOTE — Assessment & Plan Note (Signed)
Refill celebrex today.  Would check kidney function more regularly if continues to be on this medication - Consider repeat x-rays knee if continues to bother her

## 2016-02-02 NOTE — Assessment & Plan Note (Signed)
Continue HCTZ for now.  Consider Norvasc if need additional agent since AA. - Check CMET, Microal:creatinine ratio, CBCAD, Lipid Panel

## 2016-02-02 NOTE — Patient Instructions (Signed)

## 2016-02-03 LAB — MICROALBUMIN / CREATININE URINE RATIO
Creatinine, Urine: 75 mg/dL (ref 20–320)
MICROALB/CREAT RATIO: 7 ug/mg{creat} (ref <30)
Microalb, Ur: 0.5 mg/dL

## 2016-03-31 ENCOUNTER — Ambulatory Visit (INDEPENDENT_AMBULATORY_CARE_PROVIDER_SITE_OTHER): Payer: Self-pay | Admitting: Physician Assistant

## 2016-03-31 ENCOUNTER — Ambulatory Visit (INDEPENDENT_AMBULATORY_CARE_PROVIDER_SITE_OTHER): Payer: Self-pay

## 2016-03-31 VITALS — BP 154/88 | HR 72 | Temp 98.1°F | Resp 16 | Ht 66.0 in | Wt 317.2 lb

## 2016-03-31 DIAGNOSIS — M545 Low back pain, unspecified: Secondary | ICD-10-CM

## 2016-03-31 DIAGNOSIS — M170 Bilateral primary osteoarthritis of knee: Secondary | ICD-10-CM

## 2016-03-31 DIAGNOSIS — J4520 Mild intermittent asthma, uncomplicated: Secondary | ICD-10-CM

## 2016-03-31 MED ORDER — MELOXICAM 15 MG PO TABS: 15.0000 mg | ORAL_TABLET | Freq: Every day | ORAL | Status: DC

## 2016-03-31 MED ORDER — CYCLOBENZAPRINE HCL 10 MG PO TABS: 10.0000 mg | ORAL_TABLET | Freq: Three times a day (TID) | ORAL | Status: DC | PRN

## 2016-03-31 MED ORDER — TRAMADOL HCL 50 MG PO TABS: 50.0000 mg | ORAL_TABLET | Freq: Two times a day (BID) | ORAL | Status: DC | PRN

## 2016-03-31 NOTE — Progress Notes (Signed)
[facility] [address] [place] [address] [phone]  Date:  03/31/2016   Name:  Kayla Roberts   DOB:  1956/01/03   MRN:  [id number]  PCP:  Default, Provider, MD    Chief Complaint: Back Pain   History of Present Illness:  This is a 60 y.o. female with PMH HTN, depression, GERD, HLD, asthma, knee OA who is presenting with back pain x 4 days. Pt states she had back surgery in year 2000 d/t 3 herniated discs. States over the past 3 days she is getting pain at site of surgery. She can't recall doing anything out of the ordinary other than laundry and cooking prior to pain starting. She had sciatica prior to surgery. No sciatic symptoms now. No leg weakness or numbness. Denies problems with bowel or bladder. Taking aleve but no help.  Was prescribed celebrex a couple months ago for her knee OA -- states she could not afford. Instead she is taking aleve BID every day.  Needs a refill of her flovent for asthma - uses only as needed. States she will often go months without using. Allergies will trigger her asthma.  Review of Systems:  Review of Systems See HPI  Patient Active Problem List   Diagnosis Date Noted  . Primary localized osteoarthritis of knee 02/02/2016  . ANEMIA NOS 09/17/2007  . HYPERCHOLESTEROLEMIA 09/25/2006  . DEPRESSION 09/25/2006  . Essential hypertension 09/25/2006  . ASTHMA 09/25/2006  . GERD 09/25/2006    Prior to Admission medications   Medication Sig Start Date End Date Taking? Authorizing Provider  hydrochlorothiazide (HYDRODIURIL) 25 MG tablet Take 1 tablet (25 mg total) by mouth daily. 02/02/16  Yes Bryan R Hess, DO  celecoxib (CELEBREX) 200 MG capsule Take 1 capsule (200 mg total) by mouth 2 (two) times daily. Patient not taking: Reported on 03/31/2016 02/02/16   Nolon Rod, DO    No Known Allergies  Past Surgical History  Procedure Laterality Date  . Cervical discectomy  2002    Social History  Substance Use Topics   . Smoking status: Never Smoker   . Smokeless tobacco: None  . Alcohol Use: No    Family History  Problem Relation Age of Onset  . Hypertension Mother   . Hyperlipidemia Mother   . Heart disease Father   . Hyperlipidemia Father   . Hypertension Father   . Hypertension Brother   . Heart disease Brother   . Hypertension Daughter   . Diabetes Sister     Medication list has been reviewed and updated.  Physical Examination:  Physical Exam  Constitutional: She is oriented to person, place, and time. She appears well-developed and well-nourished. No distress.  HENT:  Head: Normocephalic and atraumatic.  Right Ear: Hearing normal.  Left Ear: Hearing normal.  Nose: Nose normal.  Eyes: Conjunctivae and lids are normal. Right eye exhibits no discharge. Left eye exhibits no discharge. No scleral icterus.  Cardiovascular: Normal rate, regular rhythm and normal pulses.   Pulmonary/Chest: Effort normal. No respiratory distress.  Musculoskeletal: Normal range of motion.       Lumbar back: She exhibits tenderness and bony tenderness (spinous processes, over surgical scar).  Straight leg raise negative  Neurological: She is alert and oriented to person, place, and time. She has normal strength. No sensory deficit. Gait (antalgic) abnormal.  DP pulse 2+ left foot, not palpated in right foot   Skin: Skin is warm, dry and intact. No lesion and no rash  noted.  Psychiatric: She has a normal mood and affect. Her speech is normal and behavior is normal. Thought content normal.   BP 151/89 mmHg  Pulse 72  Temp(Src) 98.1 F (36.7 C) (Oral)  Resp 16  Ht 5\' 6"  (1.676 m)  Wt 317 lb 3.2 oz (143.881 kg)  BMI 51.22 kg/m2  SpO2 94%  Dg Lumbar Spine Complete  03/31/2016  CLINICAL DATA:  Nontraumatic low back pain, worsening over the past week. EXAM: LUMBAR SPINE - COMPLETE 4+ VIEW COMPARISON:  10/23/2012 FINDINGS: The lumbar vertebrae are normal in height. Moderate degenerative lumbar disc disease  is present, greatest at L4-5. No bone lesion or bony destruction. No fracture or other acute bony abnormality. The degenerative changes are mildly worsened from 10/23/2012. IMPRESSION: Degenerative disc disease at L4-5.  No acute findings are evident Electronically Signed   By: Andreas Newport M.D.   On: 03/31/2016 18:07   Assessment and Plan:  1. Midline low back pain without sciatica DDD, no acute changes. Treat with meloxicam QAM and flexeril QHS. Tramadol prn breakthrough pain. Counseled on heat, gentle massage, gentle stretching. Return in 2 weeks if symptoms do not improve or at any time if symptoms worsen. Return precautions discussed.  - DG Lumbar Spine Complete; Future - meloxicam (MOBIC) 15 MG tablet; Take 1 tablet (15 mg total) by mouth daily.  Dispense: 30 tablet; Refill: 1 - traMADol (ULTRAM) 50 MG tablet; Take 1 tablet (50 mg total) by mouth every 12 (twelve) hours as needed.  Dispense: 20 tablet; Refill: 0 - cyclobenzaprine (FLEXERIL) 10 MG tablet; Take 1 tablet (10 mg total) by mouth 3 (three) times daily as needed for muscle spasms.  Dispense: 30 tablet; Refill: 0  2. Primary osteoarthritis of both knees Pt is taking aleve BID currently. She will switch to meloxicam to reduce risk of stomach ulcer/kidney disease. - meloxicam (MOBIC) 15 MG tablet; Take 1 tablet (15 mg total) by mouth daily.  Dispense: 30 tablet; Refill: 1  3. Asthma, mild intermittent, uncomplicated Refilled flovent.   Benjaman Pott Drenda Freeze, MHS [facility]  04/06/2016

## 2016-03-31 NOTE — Patient Instructions (Addendum)
Take meloxicam once a day in the mornings when you wake up with food. If you need something for pain later in the day, take tylenol.  For your back may take tramadol as needed for breakthrough pain. At night you can take flexeril (this is the muscle relaxer) and will help your sleep. Apply heating pad. Gentle stretching and gentle massage can help. If you develop new numbness, weakness or incontinence of urine or bowel, return to clinic or go to the emergency room ASAP. Return if your symptoms are not improving in 2 weeks, return to clinic for further eval.    IF you received an x-ray today, you will receive an invoice from [facility]. Please contact [facility] at [phone] with questions or concerns regarding your invoice.   IF you received labwork today, you will receive an invoice from [employer]. Please contact [facility] at [phone] with questions or concerns regarding your invoice.   Our billing staff will not be able to assist you with questions regarding bills from these companies.  You will be contacted with the lab results as soon as they are available. The fastest way to get your results is to activate your My Chart account. Instructions are located on the last page of this paperwork. If you have not heard from [place] regarding the results in 2 weeks, please contact this office.

## 2016-04-06 MED ORDER — FLUTICASONE PROPIONATE HFA 110 MCG/ACT IN AERO: 1.0000 | INHALATION_SPRAY | Freq: Two times a day (BID) | RESPIRATORY_TRACT | Status: DC

## 2016-09-22 ENCOUNTER — Encounter: Payer: Self-pay | Admitting: Internal Medicine

## 2016-09-22 ENCOUNTER — Ambulatory Visit (INDEPENDENT_AMBULATORY_CARE_PROVIDER_SITE_OTHER): Payer: Medicaid Other | Admitting: Internal Medicine

## 2016-09-22 VITALS — BP 151/71 | HR 93 | Temp 97.8°F | Ht 66.0 in | Wt 336.4 lb

## 2016-09-22 DIAGNOSIS — I10 Essential (primary) hypertension: Secondary | ICD-10-CM

## 2016-09-22 DIAGNOSIS — J4540 Moderate persistent asthma, uncomplicated: Secondary | ICD-10-CM

## 2016-09-22 DIAGNOSIS — Z6841 Body Mass Index (BMI) 40.0 and over, adult: Secondary | ICD-10-CM | POA: Diagnosis not present

## 2016-09-22 DIAGNOSIS — Z79899 Other long term (current) drug therapy: Secondary | ICD-10-CM | POA: Diagnosis not present

## 2016-09-22 DIAGNOSIS — J45909 Unspecified asthma, uncomplicated: Secondary | ICD-10-CM

## 2016-09-22 MED ORDER — FLUTICASONE PROPIONATE HFA 110 MCG/ACT IN AERO: 1.0000 | INHALATION_SPRAY | Freq: Two times a day (BID) | RESPIRATORY_TRACT | 2 refills | Status: DC

## 2016-09-22 MED ORDER — ALBUTEROL SULFATE HFA 108 (90 BASE) MCG/ACT IN AERS: 2.0000 | INHALATION_SPRAY | Freq: Four times a day (QID) | RESPIRATORY_TRACT | 2 refills | Status: DC | PRN

## 2016-09-22 NOTE — Progress Notes (Signed)
   CC: here for her asthma  HPI:  Kayla Roberts is a 60 y.o. woman with a past medical history listed below here today for follow up of her asthma.  For details of today's visit and the status of her chronic medical issues please refer to the assessment and plan.   Past Medical History:  Diagnosis Date  . Anxiety   . Asthma   . Depression   . GERD (gastroesophageal reflux disease)   . Hyperlipidemia   . Hypertension   . Morbid obesity ([place])     Review of Systems:   Please see pertinent ROS reviewed in HPI and problem based charting.   Physical Exam:  Vitals:   09/22/16 1613  BP: (!) 151/71  Pulse: 93  Temp: 97.8 F (36.6 C)  TempSrc: Oral  SpO2: 100%  Weight: (!) 336 lb 6.4 oz (152.6 kg)  Height: 5\' 6"  (1.676 m)   Physical Exam  Constitutional: She is oriented to person, place, and time.  Pleasant, obese, AA woman, no distress  HENT:  Head: Normocephalic and atraumatic.  Cardiovascular: Normal rate, regular rhythm and normal heart sounds.   Pulmonary/Chest: Effort normal. No respiratory distress. She has wheezes. She has no rales.  She has expiratory wheezes bilaterally with diminished air flow.  Neurological: She is alert and oriented to person, place, and time.     Assessment & Plan:   See Encounters Tab for problem based charting.  Patient discussed with Dr. Eppie Gibson.  Essential hypertension BP Readings from Last 3 Encounters:  09/22/16 (!) 151/71  03/31/16 (!) 154/88  02/02/16 (!) 146/80    A:  BP is elevated today above goal.  She is only on HCTZ 25mg  daily.  Did not take today per her report.  No headaches or blurry vision.  Body mass index is 54.3 kg/m.   P: - continue HCTZ 25mg  daily - discussed importance of weight loss and limiting use of NSAIDs as she reports frequent use of naproxen - follow up in 3 weeks with her PCP, consider 2nd agent if BP continues to be elevated   Asthma A: No PFTs on file.  With her report of daily symptoms,  occasional night time awakenings and minor interference with daily activities, suspect she has mild persistent asthma or moderate persistent asthma.  She has not been on any rescue inhaler.  She previously has only used Flovent.  She reports a cold-like symptoms about 1 month ago.  Lately, she has been having cough, wheezing, and SOB associated with coughing.  She denies fever, chills, congestion, nausea or vomiting.  She endorses some post-nasal drip.  She reports whole body aches but that this is chronic.  P:  - attempt to optimize her asthma symptoms by adding albuterol inhaler as needed and refilling her Flovent 1 puff BID.  If symptoms, uncontrolled at follow up next month, consider increasing to 2 puff BID or adding ICS/LABA - she likely would benefit from outpatient PFTs to better characterize her lung disease - if post-nasal drip continues to be an issue, could trial her on Flonase and daily anti-histamine

## 2016-09-22 NOTE — Patient Instructions (Addendum)
Thank you for coming to see me today. It was a pleasure. Today we talked about:   Your asthma symptoms being uncontrolled.  We have begun Albuterol to use as needed (Rescue Inhaler) and re-ordered Flovent to be used as scheduled (Maintenance Inhaler).  You also should continue to take your Blood pressure medications, watch your salt intake, and attempt weight loss and exercise to control your BP.  Please take your BP medication prior to your next visit with Dr. Philipp Ovens in November.  Please follow-up with [place] in about a month as scheduled.  If you have any questions or concerns, please do not hesitate to call the office at [phone].  Take Care,   Jule Ser, DO

## 2016-09-23 ENCOUNTER — Encounter: Payer: Self-pay | Admitting: Internal Medicine

## 2016-09-23 NOTE — Assessment & Plan Note (Signed)
A: No PFTs on file.  With her report of daily symptoms, occasional night time awakenings and minor interference with daily activities, suspect she has mild persistent asthma or moderate persistent asthma.  She has not been on any rescue inhaler.  She previously has only used Flovent.  She reports a cold-like symptoms about 1 month ago.  Lately, she has been having cough, wheezing, and SOB associated with coughing.  She denies fever, chills, congestion, nausea or vomiting.  She endorses some post-nasal drip.  She reports whole body aches but that this is chronic.  P:  - attempt to optimize her asthma symptoms by adding albuterol inhaler as needed and refilling her Flovent 1 puff BID.  If symptoms, uncontrolled at follow up next month, consider increasing to 2 puff BID or adding ICS/LABA - she likely would benefit from outpatient PFTs to better characterize her lung disease - if post-nasal drip continues to be an issue, could trial her on Flonase and daily anti-histamine

## 2016-09-23 NOTE — Assessment & Plan Note (Signed)
BP Readings from Last 3 Encounters:  09/22/16 (!) 151/71  03/31/16 (!) 154/88  02/02/16 (!) 146/80    A:  BP is elevated today above goal.  She is only on HCTZ 25mg  daily.  Did not take today per her report.  No headaches or blurry vision.  Body mass index is 54.3 kg/m.   P: - continue HCTZ 25mg  daily - discussed importance of weight loss and limiting use of NSAIDs as she reports frequent use of naproxen - follow up in 3 weeks with her PCP, consider 2nd agent if BP continues to be elevated

## 2016-09-26 NOTE — Progress Notes (Signed)
Case discussed with Dr. Wallace soon after the resident saw the patient. We reviewed the resident's history and exam and pertinent patient test results. I agree with the assessment, diagnosis, and plan of care documented in the resident's note. 

## 2016-09-29 ENCOUNTER — Other Ambulatory Visit: Payer: Self-pay | Admitting: *Deleted

## 2016-10-17 ENCOUNTER — Ambulatory Visit (INDEPENDENT_AMBULATORY_CARE_PROVIDER_SITE_OTHER): Payer: Medicaid Other | Admitting: Internal Medicine

## 2016-10-17 ENCOUNTER — Encounter: Payer: Self-pay | Admitting: Internal Medicine

## 2016-10-17 VITALS — BP 141/67 | HR 91 | Temp 97.9°F | Wt 333.8 lb

## 2016-10-17 DIAGNOSIS — Z0000 Encounter for general adult medical examination without abnormal findings: Secondary | ICD-10-CM

## 2016-10-17 DIAGNOSIS — Z6841 Body Mass Index (BMI) 40.0 and over, adult: Secondary | ICD-10-CM

## 2016-10-17 DIAGNOSIS — M1710 Unilateral primary osteoarthritis, unspecified knee: Secondary | ICD-10-CM

## 2016-10-17 DIAGNOSIS — I10 Essential (primary) hypertension: Secondary | ICD-10-CM

## 2016-10-17 DIAGNOSIS — Z23 Encounter for immunization: Secondary | ICD-10-CM

## 2016-10-17 DIAGNOSIS — M170 Bilateral primary osteoarthritis of knee: Secondary | ICD-10-CM

## 2016-10-17 DIAGNOSIS — Z79899 Other long term (current) drug therapy: Secondary | ICD-10-CM

## 2016-10-17 DIAGNOSIS — J4540 Moderate persistent asthma, uncomplicated: Secondary | ICD-10-CM

## 2016-10-17 MED ORDER — HYDROCHLOROTHIAZIDE 25 MG PO TABS: 25.0000 mg | ORAL_TABLET | Freq: Every day | ORAL | 3 refills | Status: DC

## 2016-10-17 MED ORDER — BECLOMETHASONE DIPROPIONATE 40 MCG/ACT IN AERS: 2.0000 | INHALATION_SPRAY | Freq: Two times a day (BID) | RESPIRATORY_TRACT | 2 refills | Status: DC

## 2016-10-17 MED ORDER — LISINOPRIL 10 MG PO TABS: 10.0000 mg | ORAL_TABLET | Freq: Every day | ORAL | 2 refills | Status: DC

## 2016-10-17 MED ORDER — CELECOXIB 200 MG PO CAPS: 200.0000 mg | ORAL_CAPSULE | Freq: Every day | ORAL | 2 refills | Status: AC

## 2016-10-17 NOTE — Progress Notes (Incomplete)
I saw and evaluated the patient.  I personally confirmed the key portions of Dr. Rivka Safer history and exam and reviewed pertinent patient test results.  The assessment, diagnosis, and plan were formulated together and I agree with the documentation in the resident's note.  Procedure Note  Indication: Bilateral osteoarthritic knee pain that is limiting exercise and quality of life  The patient was provided with risks, benefits, and alternatives to intraarticular injections.  She consented to bilateral knee injections.  After a time out was preformed both knees were prepped in a sterile fashion.  After cold spray was applied to the skin over the insertion site (lateral inferior patellar aspect of the knee while in the seated position), a mixture of 1 cc 1% lidocaine and 40 mg of kenelog was injected into each knee using a 27 gauge needle.

## 2016-10-17 NOTE — Assessment & Plan Note (Signed)
Discussed the importance of weight loss in the setting of her knee pain. Patient reports difficulty with walking and exercise due to pain. She understands that weight loss will help with her pain. Encouraged patient to count calories and seek out minimal impact exercise like swimming. She received bilateral intra articular knee injections today with immediate improvement in symptoms. Will continue to encourage patient to diet and exercise.

## 2016-10-17 NOTE — Progress Notes (Signed)
   CC: Asthma, BP follow up  HPI:  [name] is a 60 y.o. F here for follow up of her asthma and BP. She was seen in clinic one month ago and was started on an albuterol inhaler prn in addition to her flovent. Her symptoms were previously well controlled on flovent, however, since her last visit her insurance has stopped approving this medication. She has been using only albuterol daily and is now endorsing nightly symptoms of wheezing and cough. She wakes up multiple times throughout the night. She is also complaining of pain in her bilateral knees due to arthritis. She is currently taking over the counter naproxen daily with minimal relief. Her knee pain is limiting her ability to function day to day as she is having difficulty walking. She was previously taking Celebrex which she felt provided better pain relief but has run out of her prescription.   Past Medical History:  Diagnosis Date  . Anxiety   . Asthma   . Depression   . GERD (gastroesophageal reflux disease)   . Hyperlipidemia   . Hypertension   . Morbid obesity ([place])     Review of Systems:  All pertinents listed in HPI, otherwise negative.    Vitals:   10/17/16 1546  BP: (!) 141/67  Pulse: 91  Temp: 97.9 F (36.6 C)  TempSrc: Oral  Weight: (!) 333 lb 12.8 oz (151.4 kg)    Physical Exam Constitutional: Obese, NAD, appears comfortable Cardiovascular: RRR, no murmurs, rubs, or gallops.  Pulmonary/Chest: CTAB, no wheezes, rales, or rhonchi. Abdominal: Soft, non tender, non distended. +BS.  Extremities: Warm and well perfused. Distal pulses intact. No edema.  Neurological: A&Ox3, CN II - XII grossly intact.  Skin: No rashes or erythema  Psychiatric: Normal mood and affect  Assessment & Plan:   See Encounters Tab for problem based charting.  Patient seen with Dr. Eppie Gibson

## 2016-10-17 NOTE — Progress Notes (Signed)
Patient ID: Kayla Roberts, female   DOB: Aug 16, 1956, 60 y.o.   MRN: [id number]  I saw and evaluated the patient.  I personally confirmed the key portions of Dr. Rivka Safer history and exam and reviewed pertinent patient test results.  The assessment, diagnosis, and plan were formulated together and I agree with the documentation in the resident's note.  Procedure Note  Indication: Bilateral osteoarthritic knee pain that is limiting exercise and quality of life  Operators: Drs Trish Fountain  The patient was provided with risks, benefits, and alternatives to intraarticular injections.  She consented to bilateral knee injections.  After a time out was preformed both knees were prepped in a sterile fashion.  After cold spray was applied to the skin over the insertion site (lateral inferior patellar aspect of the knee while in the seated position), a mixture of 1 cc 1% lidocaine and 40 mg of Kenalog was injected into each knee using a 27 gauge needle.  The left knee space was entered without difficulty.  The right knee space required three attempts before successful placement for injection.  The patient tolerated the procedures well and noted immediate improvement in her knee pain.  I was present at the patient's side for the entirety of both knee injections.

## 2016-10-17 NOTE — Assessment & Plan Note (Addendum)
Patient reports significant pain in her bilateral knees from her osteoarthritis, poorly controlled on over the counter naproxen. Reports difficulty with walking even short distances.  -- Bilateral intra articular injections today  -- Refilled Celebrex 200 mg daily

## 2016-10-17 NOTE — Assessment & Plan Note (Signed)
Flu shot today. Referred for screening mammogram.

## 2016-10-17 NOTE — Assessment & Plan Note (Addendum)
Not effectively controlled on albuterol. Seen in clinic one month ago and was given a refill of her Flovent BID and started on albuterol prn. Patient was unable to fill her Flovent due to insurance coverage issues and has been using her albuterol inhaler daily. She says her symptoms were previously well controlled on the Flovent but have been worsening since stopping the inhaler. She now endorses nightly symptoms of wheezing and cough and wakes up multiple times throughout the night.  -- Continue albuterol prn -- Provided sample of Asmanex QD (equivalent dosing to her Flovent) and prescribed Qvar per pharmacy recommendations, which should be covered by her insurance. Instructed to begin Qvar when she has finished the Asmanex. Instructed not to use inhalers together.  -- Follow up in 3 months

## 2016-10-17 NOTE — Progress Notes (Signed)
Medication Samples have been provided to the patient.  Drug: mometasone (Asmanex) Strength: 100 mcg Qty: 1 LOT: [id number] Exp.Date: 10/25/16 Dosing instructions: 1 puff daily  The patient has been instructed regarding the correct time, dose, and frequency of taking this medication, including desired effects and most common side effects.

## 2016-10-17 NOTE — Patient Instructions (Signed)
For you asthma, we have provided you with a sample of a new inhaler called Asmanex. Please use this once daily as instructed. You may continue to use your albuterol inhaler as needed. I have also sent in a prescription for a different inhaler, Qvar, to your pharmacy. This is a similar medicine to the Flovent but should be covered with your insurance. Please do not use the Asmanex and Qvar together. Once you have finished the Asmanex inhaler you may start using the Qvar. We have also started a new blood pressure medicine, lisinopril, that you should take every day. I have refilled your celebrex for you knee pain as well. Please follow up in 3 months for you asthma and blood pressure. If you have any questions or concerns, call our clinic at [phone] or after hours call [phone] and ask for the internal medicine resident on call. Thank you!

## 2016-10-17 NOTE — Assessment & Plan Note (Addendum)
BP elevated today 141/67 and persistently elevated on recheck. On chart review it appears her BP was elevated at her last visit as well. She reports compliance with her HCTZ. Discussed the risks and benefits of starting a new BP medication and the possible side effects associated with lisinopril (cough, angioedema). Patient is agreeable and will notify me of any adverse events.  -- Continue HCTZ 25 mg daily -- Start Lisinopril 10 mg daily -- Follow up in 3 months

## 2016-10-18 ENCOUNTER — Other Ambulatory Visit: Payer: Self-pay | Admitting: Internal Medicine

## 2016-10-18 DIAGNOSIS — Z1231 Encounter for screening mammogram for malignant neoplasm of breast: Secondary | ICD-10-CM

## 2016-11-11 ENCOUNTER — Ambulatory Visit: Payer: Medicaid Other

## 2016-11-18 ENCOUNTER — Ambulatory Visit: Payer: Medicaid Other

## 2016-12-19 ENCOUNTER — Ambulatory Visit
Admission: RE | Admit: 2016-12-19 | Discharge: 2016-12-19 | Disposition: A | Discharge status: Home / Self Care | Payer: Medicaid Other | Source: Ambulatory Visit | Attending: Internal Medicine | Admitting: Internal Medicine

## 2016-12-19 DIAGNOSIS — Z1231 Encounter for screening mammogram for malignant neoplasm of breast: Secondary | ICD-10-CM

## 2017-02-23 ENCOUNTER — Telehealth: Payer: Self-pay | Admitting: Internal Medicine

## 2017-02-23 NOTE — Telephone Encounter (Signed)
APT. REMINDER CALL, NO ANSWER, MAIL BOX IS FULL

## 2017-02-27 ENCOUNTER — Encounter: Payer: Self-pay | Admitting: Internal Medicine

## 2017-02-27 ENCOUNTER — Encounter: Payer: Medicaid Other | Admitting: Internal Medicine

## 2017-03-19 ENCOUNTER — Encounter (HOSPITAL_COMMUNITY): Payer: Self-pay | Admitting: Emergency Medicine

## 2017-03-19 ENCOUNTER — Ambulatory Visit (HOSPITAL_COMMUNITY)
Admission: EM | Admit: 2017-03-19 | Discharge: 2017-03-19 | Disposition: A | Discharge status: Home / Self Care | Payer: Medicaid Other | Attending: Family Medicine | Admitting: Family Medicine

## 2017-03-19 DIAGNOSIS — R05 Cough: Secondary | ICD-10-CM | POA: Diagnosis not present

## 2017-03-19 DIAGNOSIS — R059 Cough, unspecified: Secondary | ICD-10-CM

## 2017-03-19 MED ORDER — BENZONATATE 100 MG PO CAPS: 200.0000 mg | ORAL_CAPSULE | Freq: Three times a day (TID) | ORAL | 0 refills | Status: DC | PRN

## 2017-03-19 MED ORDER — IPRATROPIUM BROMIDE 0.06 % NA SOLN: 2.0000 | Freq: Four times a day (QID) | NASAL | 0 refills | Status: DC

## 2017-03-19 MED ORDER — OLOPATADINE HCL 0.2 % OP SOLN: OPHTHALMIC | 0 refills | Status: DC

## 2017-03-19 NOTE — ED Provider Notes (Signed)
CSN: [id number]     Arrival date & time 03/19/17  1650 History   None    Chief Complaint  Patient presents with  . Cough   (Consider location/radiation/quality/duration/timing/severity/associated sxs/prior Treatment) Patient c/o cough for a month.  She is taking otc prilosec.  She has a lot of mucus production.   The history is provided by the patient.  Cough  Cough characteristics:  Productive Sputum characteristics:  White Severity:  Mild Onset quality:  Sudden Duration:  2 days Timing:  Constant Progression:  Worsening Chronicity:  New Relieved by:  Nothing Worsened by:  Nothing   Past Medical History:  Diagnosis Date  . Anxiety   . Asthma   . Depression   . GERD (gastroesophageal reflux disease)   . Hyperlipidemia   . Hypertension   . Morbid obesity ([place])    Past Surgical History:  Procedure Laterality Date  . CERVICAL DISCECTOMY  2002   Family History  Problem Relation Age of Onset  . Hypertension Mother   . Hyperlipidemia Mother   . Heart disease Father   . Hyperlipidemia Father   . Hypertension Father   . Hypertension Brother   . Heart disease Brother   . Hypertension Daughter   . Diabetes Sister    Social History  Substance Use Topics  . Smoking status: Never Smoker  . Smokeless tobacco: Not on file  . Alcohol use No   OB History    No data available     Review of Systems  Constitutional: Negative.   HENT: Positive for congestion.   Eyes: Negative.   Respiratory: Positive for cough.   Cardiovascular: Negative.   Gastrointestinal: Negative.   Endocrine: Negative.   Genitourinary: Negative.   Musculoskeletal: Negative.   Allergic/Immunologic: Negative.   Neurological: Negative.   Hematological: Negative.   Psychiatric/Behavioral: Negative.     Allergies  Patient has no known allergies.  Home Medications   Prior to Admission medications   Medication Sig Start Date End Date Taking? Authorizing Provider  albuterol (PROVENTIL  HFA;VENTOLIN HFA) 108 (90 Base) MCG/ACT inhaler Inhale 2 puffs into the lungs every 6 (six) hours as needed for wheezing or shortness of breath. 09/22/16   Jule Ser, DO  beclomethasone (QVAR) 40 MCG/ACT inhaler Inhale 2 puffs into the lungs 2 (two) times daily. 10/17/16   Velna Ochs, MD  benzonatate (TESSALON) 100 MG capsule Take 2 capsules (200 mg total) by mouth 3 (three) times daily as needed for cough. 03/19/17   Lysbeth Penner, FNP  celecoxib (CELEBREX) 200 MG capsule Take 1 capsule (200 mg total) by mouth daily. As needed for knee pain 10/17/16 10/17/17  Velna Ochs, MD  fluticasone (FLOVENT HFA) 110 MCG/ACT inhaler Inhale 1 puff into the lungs 2 (two) times daily. 09/22/16   Jule Ser, DO  hydrochlorothiazide (HYDRODIURIL) 25 MG tablet Take 1 tablet (25 mg total) by mouth daily. 10/17/16   Velna Ochs, MD  ipratropium (ATROVENT) 0.06 % nasal spray Place 2 sprays into both nostrils 4 (four) times daily. 03/19/17   Lysbeth Penner, FNP  lisinopril (PRINIVIL,ZESTRIL) 10 MG tablet Take 1 tablet (10 mg total) by mouth daily. 10/17/16 10/17/17  Velna Ochs, MD  Olopatadine HCl 0.2 % SOLN One drop per eye qd 03/19/17   Lysbeth Penner, FNP   Meds Ordered and Administered this Visit  Medications - No data to display  BP (!) 146/79 (BP Location: Right Arm) Comment (BP Location): large cuff  Pulse 66   Temp 98.7 F (  37.1 C) (Oral)   Resp 18   SpO2 100%  No data found.   Physical Exam  Constitutional: She appears well-developed and well-nourished.  HENT:  Head: Normocephalic and atraumatic.  Right Ear: External ear normal.  Left Ear: External ear normal.  Mouth/Throat: Oropharynx is clear and moist.  Eyes: Conjunctivae and EOM are normal. Pupils are equal, round, and reactive to light.  Neck: Normal range of motion. Neck supple.  Cardiovascular: Normal rate, regular rhythm and normal heart sounds.   Pulmonary/Chest: Effort normal and breath sounds normal.   Nursing note and vitals reviewed.   Urgent Care Course     Procedures (including critical care time)  Labs Review Labs Reviewed - No data to display  Imaging Review No results found.   Visual Acuity Review  Right Eye Distance:   Left Eye Distance:   Bilateral Distance:    Right Eye Near:   Left Eye Near:    Bilateral Near:         MDM   1. Cough   2. Allergies  Atrovent nasal spray [place] eye drop one drop each eye qd [place], [place] 03/19/17 1736

## 2017-03-19 NOTE — ED Triage Notes (Signed)
Cough for a month, cough will not go away.  Cough wakes her at night and producing a lot of mucus, itchy eyes

## 2017-10-13 ENCOUNTER — Ambulatory Visit (INDEPENDENT_AMBULATORY_CARE_PROVIDER_SITE_OTHER): Payer: Medicaid Other | Admitting: Internal Medicine

## 2017-10-13 DIAGNOSIS — I10 Essential (primary) hypertension: Secondary | ICD-10-CM | POA: Diagnosis not present

## 2017-10-13 DIAGNOSIS — Z79899 Other long term (current) drug therapy: Secondary | ICD-10-CM | POA: Diagnosis not present

## 2017-10-13 DIAGNOSIS — Z9114 Patient's other noncompliance with medication regimen: Secondary | ICD-10-CM

## 2017-10-13 DIAGNOSIS — Z23 Encounter for immunization: Secondary | ICD-10-CM

## 2017-10-13 DIAGNOSIS — J45909 Unspecified asthma, uncomplicated: Secondary | ICD-10-CM

## 2017-10-13 DIAGNOSIS — J4540 Moderate persistent asthma, uncomplicated: Secondary | ICD-10-CM

## 2017-10-13 DIAGNOSIS — K648 Other hemorrhoids: Secondary | ICD-10-CM | POA: Diagnosis not present

## 2017-10-13 MED ORDER — HYDROCHLOROTHIAZIDE 25 MG PO TABS: 25.0000 mg | ORAL_TABLET | Freq: Every day | ORAL | 0 refills | Status: DC

## 2017-10-13 MED ORDER — ALBUTEROL SULFATE HFA 108 (90 BASE) MCG/ACT IN AERS: 2.0000 | INHALATION_SPRAY | Freq: Four times a day (QID) | RESPIRATORY_TRACT | 0 refills | Status: DC | PRN

## 2017-10-13 MED ORDER — LOSARTAN POTASSIUM 50 MG PO TABS: 50.0000 mg | ORAL_TABLET | Freq: Every day | ORAL | 0 refills | Status: DC

## 2017-10-13 NOTE — Patient Instructions (Signed)
Please add up to 25 grams of fiber into your diet daily.  This information may assist. You should also purchase a fiber supplement.  Please call us with any questions at any time. Thank you for your visit to the [facility] today. Please stay warm and dry.  Fiber Content in Foods See the following list for the dietary fiber content of some common foods. High-fiber foods High-fiber foods contain 4 grams or more (4g or more) of fiber per serving. They include:  Artichoke (fresh) - 1 medium has 10.3g of fiber.  Baked beans, plain or vegetarian (canned) -  cup has 5.2g of fiber.  Blackberries or raspberries (fresh) -  cup has 4g of fiber.  Bran cereal -  cup has 8.6g of fiber.  Bulgur (cooked) -  cup has 4g of fiber.  Kidney beans (canned) -  cup has 6.8g of fiber.  Lentils (cooked) -  cup has 7.8g of fiber.  Pear (fresh) - 1 medium has 5.1g of fiber.  Peas (frozen) -  cup has 4.4g of fiber.  Pinto beans (canned) -  cup has 5.5g of fiber.  Pinto beans (dried and cooked) -  cup has 7.7g of fiber.  Potato with skin (baked) - 1 medium has 4.4g of fiber.  Quinoa (cooked) -  cup has 5g of fiber.  Soybeans (canned, frozen, or fresh) -  cup has 5.1g of fiber.  Moderate-fiber foods Moderate-fiber foods contain 1-4 grams (1-4g) of fiber per serving. They include:  Almonds - 1 oz. has 3.5g of fiber.  Apple with skin - 1 medium has 3.3g of fiber.  Applesauce, sweetened -  cup has 1.5g of fiber.  Bagel, plain - one 4-inch (10-cm) bagel has 2g of fiber.  Banana - 1 medium has 3.1g of fiber.  Broccoli (cooked) -  cup has 2.5g of fiber.  Carrots (cooked) -  cup has 2.3g of fiber.  Corn (canned or frozen) -  cup has 2.1g of fiber.  Corn tortilla - one 6-inch (15-cm) tortilla has 1.5g of fiber.  Green beans (canned) -  cup has 2g of fiber.  Instant oatmeal -  cup has about 2g of fiber.  Long-grain brown rice (cooked) - 1 cup has 3.5g of  fiber.  Macaroni, enriched (cooked) - 1 cup has 2.5g of fiber.  Melon - 1 cup has 1.4g of fiber.  Multigrain cereal -  cup has about 2-4g of fiber.  Orange - 1 small has 3.1g of fiber.  Potatoes, mashed -  cup has 1.6g of fiber.  Raisins - 1/4 cup has 1.6g of fiber.  Squash -  cup has 2.9g of fiber.  Sunflower seeds -  cup has 1.1g of fiber.  Tomato - 1 medium has 1.5g of fiber.  Vegetable or soy patty - 1 has 3.4g of fiber.  Whole-wheat bread - 1 slice has 2g of fiber.  Whole-wheat spaghetti -  cup has 3.2g of fiber.  Low-fiber foods Low-fiber foods contain less than 1 gram (less than 1g) of fiber per serving. They include:  Egg - 1 large.  Flour tortilla - one 6-inch (15-cm) tortilla.  Fruit juice -  cup.  Lettuce - 1 cup.  Meat, poultry, or fish - 1 oz.  Milk - 1 cup.  Spinach (raw) - 1 cup.  White bread - 1 slice.  White rice -  cup.  Yogurt -  cup.  Actual amounts of fiber in foods may be different depending on processing. Talk with your  dietitian about how much fiber you need in your diet. This information is not intended to replace advice given to you by your health care provider. Make sure you discuss any questions you have with your health care provider. Document Released: 04/02/2007 Document Revised: 04/21/2016 Document Reviewed: 01/07/2016 Elsevier Interactive Patient Education  2018 [employer].

## 2017-10-13 NOTE — Assessment & Plan Note (Signed)
Hemorrhoids: Assessment: Patient has a history of internal hemorrhoids with recent symptoms consistent with this diagnosis.  Plan: Patient refused rectal exam She will need Colonoscopy for regular screening as well. Patient stated that she will consider this as well.  Advised to call us if she develops worsening symptoms.  Advised patient dietary changes and provided list of high fiber foods in AVS.

## 2017-10-13 NOTE — Assessment & Plan Note (Signed)
Hypertension: Assessment: Patient states that this is usually 138-140 at home and does not want to start an additional medication  HCTZ 25mg  daily Lisinopril-discontinued as the patient believes this hurts her head.   Plan: Will attempt Losartan as she did not tolerate the lisinopril, patient verbally agreed to this after much negotiation.  Refilled HCTZ Patient non compliant with medications.

## 2017-10-13 NOTE — Progress Notes (Signed)
   CC: hemorrhoids and high blood pressure  HPI:Kayla Roberts is a 61 y.o. female who presents today for evaluation of hemorrhoids with minimal blood streaking on stool. Patient took her medications this morning but has not been to our clinic for follow-up in over one year. Denied fever, nausea, vomiting, diarrhea, muscle aches, visual changes, chest pain, palpitations, abdominal pain or SOB.  Severe constipation last week, very difficult passing of stool which was streaked with blood. Stool has been loose without reoccurrence of the passage of blood. She described the blood as bright red and in a significant enough quantity to cause taint the toilet water red. Has a history of hemorrhoids approximately ~20 years past which was diagnosed with colonoscopy which was otherwise unremarkable.  Has been in [place] for the past year to assist her daughter with her twins.   Past Medical History:  Diagnosis Date  . Anxiety   . Asthma   . Depression   . GERD (gastroesophageal reflux disease)   . Hyperlipidemia   . Hypertension   . Morbid obesity ([place])    Review of Systems:  ROS negative except as per HPI.  Physical Exam:  Vitals:   10/13/17 1554  BP: (!) 188/96  Pulse: 89  Temp: 97.8 F (36.6 C)  TempSrc: Oral  SpO2: 100%  Weight: (!) 326 lb 6.4 oz (148.1 kg)  Height: 5\' 8"  (1.727 m)   Physical Exam  Constitutional: She appears well-nourished. No distress.  Cardiovascular: Normal rate and regular rhythm.  No murmur heard. Pulmonary/Chest: Effort normal and breath sounds normal. No stridor.  Abdominal: Soft. Bowel sounds are normal. She exhibits no distension. There is no tenderness.  Musculoskeletal: She exhibits no edema or tenderness.    Assessment & Plan:   See Encounters Tab for problem based charting.  Hypertension: Assessment: Patient states that this is usually 138-140 at home and does not want to start an additional medication  HCTZ 25mg   daily Lisinopril-discontinued as the patient believes this hurts her head.   Plan: Will attempt Losartan as she did not tolerate the lisinopril, patient verbally agreed to this after much negotiation.  Refilled HCTZ Patient non compliant with medications.  Hemorrhoids: Assessment: Patient has a history of internal hemorrhoids.   Plan: Patient refused rectal exam Advised to call us if she develops worsening symptoms.  Advised patient dietary changes and provided list of high fiber foods in AVS.  ASTHMA: Refilled albuterol inhaler   Patient seen with Dr. Angelia Mould

## 2017-10-17 NOTE — Progress Notes (Signed)
Internal Medicine Clinic Attending  I saw and evaluated the patient.  I personally confirmed the key portions of the history and exam documented by Dr. Harbrecht and I reviewed pertinent patient test results.  The assessment, diagnosis, and plan were formulated together and I agree with the documentation in the resident's note.  

## 2018-01-23 ENCOUNTER — Other Ambulatory Visit: Payer: Self-pay | Admitting: Internal Medicine

## 2018-01-23 DIAGNOSIS — J4540 Moderate persistent asthma, uncomplicated: Secondary | ICD-10-CM

## 2018-01-29 ENCOUNTER — Encounter: Payer: Medicaid Other | Admitting: Internal Medicine

## 2018-02-19 ENCOUNTER — Ambulatory Visit (INDEPENDENT_AMBULATORY_CARE_PROVIDER_SITE_OTHER): Payer: Medicaid Other | Admitting: Internal Medicine

## 2018-02-19 ENCOUNTER — Other Ambulatory Visit: Payer: Self-pay

## 2018-02-19 ENCOUNTER — Encounter: Payer: Self-pay | Admitting: Internal Medicine

## 2018-02-19 ENCOUNTER — Ambulatory Visit (HOSPITAL_COMMUNITY)
Admission: RE | Admit: 2018-02-19 | Discharge: 2018-02-19 | Disposition: A | Discharge status: Home / Self Care | Payer: Medicaid Other | Source: Ambulatory Visit | Attending: Internal Medicine | Admitting: Internal Medicine

## 2018-02-19 ENCOUNTER — Telehealth: Payer: Self-pay

## 2018-02-19 VITALS — BP 138/62 | HR 86 | Temp 98.6°F | Wt 332.9 lb

## 2018-02-19 DIAGNOSIS — M1710 Unilateral primary osteoarthritis, unspecified knee: Secondary | ICD-10-CM

## 2018-02-19 DIAGNOSIS — J4540 Moderate persistent asthma, uncomplicated: Secondary | ICD-10-CM | POA: Diagnosis not present

## 2018-02-19 DIAGNOSIS — M170 Bilateral primary osteoarthritis of knee: Secondary | ICD-10-CM | POA: Insufficient documentation

## 2018-02-19 DIAGNOSIS — I10 Essential (primary) hypertension: Secondary | ICD-10-CM

## 2018-02-19 DIAGNOSIS — Z7951 Long term (current) use of inhaled steroids: Secondary | ICD-10-CM | POA: Diagnosis not present

## 2018-02-19 DIAGNOSIS — Z791 Long term (current) use of non-steroidal anti-inflammatories (NSAID): Secondary | ICD-10-CM | POA: Diagnosis not present

## 2018-02-19 DIAGNOSIS — K648 Other hemorrhoids: Secondary | ICD-10-CM

## 2018-02-19 DIAGNOSIS — Z6841 Body Mass Index (BMI) 40.0 and over, adult: Secondary | ICD-10-CM | POA: Diagnosis not present

## 2018-02-19 DIAGNOSIS — Z79899 Other long term (current) drug therapy: Secondary | ICD-10-CM

## 2018-02-19 DIAGNOSIS — M25462 Effusion, left knee: Secondary | ICD-10-CM | POA: Diagnosis not present

## 2018-02-19 DIAGNOSIS — R011 Cardiac murmur, unspecified: Secondary | ICD-10-CM | POA: Diagnosis not present

## 2018-02-19 MED ORDER — HYDROCHLOROTHIAZIDE 25 MG PO TABS: 25.0000 mg | ORAL_TABLET | Freq: Every day | ORAL | 1 refills | Status: DC

## 2018-02-19 MED ORDER — ATORVASTATIN CALCIUM 20 MG PO TABS: 20.0000 mg | ORAL_TABLET | Freq: Every day | ORAL | 11 refills | Status: DC

## 2018-02-19 MED ORDER — BECLOMETHASONE DIPROPIONATE 40 MCG/ACT IN AERS: 2.0000 | INHALATION_SPRAY | Freq: Two times a day (BID) | RESPIRATORY_TRACT | 2 refills | Status: DC

## 2018-02-19 NOTE — Assessment & Plan Note (Addendum)
Patient is morbidly obese with chronic bilateral knee pain. I had previously administered bilateral intra articular steroid injections that provided temporary relief, but she is uninterested in trying this again. Pain is only partially relieved with NSAIDs and interfering with her ability to ambulate. She is inquiring about knee replacement surgery. Will obtain xrays today to better characterize her degree of degenerative changes. I advised weight loss, she is agreeable to meeting with our dietician. -- F/u bilateral knee xrays -- Encouraged weight loss, referral to medical nutrition therapy  -- Continue aleve prn  ADDENDUM: Xrays with moderate to marked degenerative changes bilaterally. Ortho referral placed. Called patient with results.

## 2018-02-19 NOTE — Assessment & Plan Note (Signed)
BP today is 138/62, she has been out of her BP medication for 3 days. She was previously prescribed HCTZ 25 mg and lisinopril 10. Lisinopril was changed to losartan at her last visit due to side effect of headaches. Since then, she has stopped her losartan due to an episode of exertional chest pain that she felt was a side effect. She has been taking HCTZ 25 mg alone, until she ran out 3 days ago. Given that her BP is only mildly elevated today, will continue HCTZ alone. Follow up 1 month for BP recheck.  -- Refilled HCTZ 25 mg -- F/u 1 month BP recheck; if elevated consider restarted losartan vs. Amlodipine

## 2018-02-19 NOTE — Patient Instructions (Addendum)
Ms. Crunk,  It was a pleasure seeing you today.   For your blood pressure, please continue to take hydrochlorothiazide 25 mg daily. You may stop your other medicine, losartan for now.   I have restarted your cholesterol medicine. Please take atorvastatin 20 mg daily.  I have refilled your Qvar inhaler. Please continue this daily.  I will request records of your last colonoscopy.  For your knee pain, I have ordered xrays of both your knees. Once I get these, I will call you with the results and we can talk about referring you to an orthopedic surgeon. I have also referred you to a dietitian for weight loss. You will be called to schedule this.   For your chest pain and heart murmur, I have ordered an echocardiogram of your heart. You will be called to schedule this.   Please follow up with me again in 1 month for blood pressure follow up.   If you have any questions or concerns, call our clinic at [phone] or after hours call [phone] and ask for the internal medicine resident on call. Thank you!  - Dr. Philipp Ovens

## 2018-02-19 NOTE — Assessment & Plan Note (Signed)
Referral to medical nutritional therapy. Encouraged weight loss in the setting of her severe bilateral knee pain limiting her ability to walk.

## 2018-02-19 NOTE — Assessment & Plan Note (Signed)
Systolic murmur appreciated on exam. I do not see that she has ever had a cardiac work up. She also endorsed one episode of exertional chest pain 1 week ago that was left sided but sharp in nature. Thought this was secondary to her losartan which she subsequently discontinued. She has not had any further episodes of chest pain. Although it was atypical, given her age and co morbidities would have a low threshold to refer for stress testing if chest pain recurs.  -- Echocardiogram  -- Consider stress test if chest pain recurs

## 2018-02-19 NOTE — Assessment & Plan Note (Signed)
Recent episode of BRBPR felt to be secondary to internal hemorrhoids. Now resolved. Reports her last colonoscopy was 8 years ago. Will obtain records.  -- Record request

## 2018-02-19 NOTE — Progress Notes (Signed)
   CC: Knee pain, HTN follow up  HPI:  [name] is a 62 y.o. female with past medical history outlined below here for knee pain and HTN follow up. For the details of today's visit, please refer to the assessment and plan.  Past Medical History:  Diagnosis Date  . Anxiety   . Asthma   . Depression   . GERD (gastroesophageal reflux disease)   . Hyperlipidemia   . Hypertension   . Morbid obesity ([place])     ROS  Physical Exam:  Vitals:   02/19/18 1502  BP: 138/62  Pulse: 86  Temp: 98.6 F (37 C)  TempSrc: Oral  Weight: (!) 332 lb 14.4 oz (151 kg)    Constitutional: NAD, appears comfortable Cardiovascular: RRR, systolic murmur LUSB. Unable to auscultate apex or left lower sternal boarder due to morbid obesity.  Pulmonary/Chest: CTAB, no wheezes, rales, or rhonchi.  Extremities: Warm and well perfused. No edema.  Neurological: A&Ox3, CN II - XII grossly intact.  Psychiatric: Normal mood and affect  Assessment & Plan:   See Encounters Tab for problem based charting.  Patient discussed with Dr. Dareen Piano

## 2018-02-19 NOTE — Assessment & Plan Note (Signed)
Well controlled on daily Qvar and prn albuterol. Currently requiring her rescue inhaler 2 x a week, mostly at night.  -- Refilled Qvar 40 mcg 2 puff BID -- Continue prn albuterol

## 2018-02-20 NOTE — Addendum Note (Signed)
Addended by: Jodean Lima on: 02/20/2018 10:56 AM   Modules accepted: Orders

## 2018-02-21 NOTE — Progress Notes (Signed)
Internal Medicine Clinic Attending  Case discussed with Dr. Guilloud at the time of the visit.  We reviewed the resident's history and exam and pertinent patient test results.  I agree with the assessment, diagnosis, and plan of care documented in the resident's note.  

## 2018-03-01 ENCOUNTER — Ambulatory Visit (INDEPENDENT_AMBULATORY_CARE_PROVIDER_SITE_OTHER): Payer: Self-pay | Admitting: Surgery

## 2018-03-08 ENCOUNTER — Emergency Department (HOSPITAL_COMMUNITY): Payer: Medicaid Other

## 2018-03-08 ENCOUNTER — Emergency Department (HOSPITAL_COMMUNITY)
Admission: EM | Admit: 2018-03-08 | Discharge: 2018-03-09 | Disposition: A | Discharge status: Home / Self Care | Payer: Medicaid Other | Attending: Emergency Medicine | Admitting: Emergency Medicine

## 2018-03-08 ENCOUNTER — Ambulatory Visit (INDEPENDENT_AMBULATORY_CARE_PROVIDER_SITE_OTHER): Payer: Medicaid Other | Admitting: Surgery

## 2018-03-08 ENCOUNTER — Encounter (INDEPENDENT_AMBULATORY_CARE_PROVIDER_SITE_OTHER): Payer: Self-pay | Admitting: Surgery

## 2018-03-08 ENCOUNTER — Encounter (HOSPITAL_COMMUNITY): Payer: Self-pay | Admitting: Emergency Medicine

## 2018-03-08 VITALS — Ht 68.0 in | Wt 332.0 lb

## 2018-03-08 DIAGNOSIS — M1711 Unilateral primary osteoarthritis, right knee: Secondary | ICD-10-CM | POA: Diagnosis not present

## 2018-03-08 DIAGNOSIS — R0789 Other chest pain: Secondary | ICD-10-CM

## 2018-03-08 DIAGNOSIS — R51 Headache: Secondary | ICD-10-CM | POA: Insufficient documentation

## 2018-03-08 DIAGNOSIS — R519 Headache, unspecified: Secondary | ICD-10-CM

## 2018-03-08 DIAGNOSIS — M1712 Unilateral primary osteoarthritis, left knee: Secondary | ICD-10-CM

## 2018-03-08 DIAGNOSIS — I10 Essential (primary) hypertension: Secondary | ICD-10-CM | POA: Insufficient documentation

## 2018-03-08 DIAGNOSIS — Z79899 Other long term (current) drug therapy: Secondary | ICD-10-CM | POA: Insufficient documentation

## 2018-03-08 LAB — CBG MONITORING, ED: Glucose-Capillary: 170 mg/dL — ABNORMAL HIGH (ref 65–99)

## 2018-03-08 MED ORDER — ASPIRIN 81 MG PO CHEW
324.0000 mg | CHEWABLE_TABLET | Freq: Once | ORAL | Status: AC
  Administered 2018-03-08: 324 mg via ORAL
  Filled 2018-03-08: qty 4

## 2018-03-08 MED ORDER — ACETAMINOPHEN 325 MG PO TABS
650.0000 mg | ORAL_TABLET | Freq: Once | ORAL | Status: AC
  Administered 2018-03-08: 650 mg via ORAL
  Filled 2018-03-08: qty 2
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
## 2018-03-08 NOTE — ED Triage Notes (Signed)
Pt from home with c/o 10/10 headache. Pt initially checked in for SOB but states those symptoms have subsided. Pt stated she got cortisone shots in knee today. Pt states after she got home she took aspirin and her bp medication. Pt is hypertensive at time of assessment and has "intense HA". No neuro deficits. Pt has clear lung sounds

## 2018-03-08 NOTE — ED Provider Notes (Signed)
[place] DEPT Provider Note   CSN: [id number] Arrival date & time: 03/08/18  2130     History   Chief Complaint Chief Complaint  Patient presents with  . Hypertension  . Headache    HPI Kayla Roberts is a 62 y.o. female.  HPI Patient states that earlier today she went to her orthopedic doctor and got cortisone shots.  She had no difficulties with that procedure.  Later in the day when she was home she started experiencing some discomfort in the back of her left shoulder.  She did not feel well.  She was not short of breath or nauseated however.  She thought her blood pressure was elevated so she took some of her blood pressure medications.  She then started developing a headache in the front of her head behind her eyes.  She denies any trouble with her speech or balance or coordination.  No visual difficulties.  Patient was concerned about her symptoms so she came to the emergency room for evaluation. Past Medical History:  Diagnosis Date  . Anxiety   . Asthma   . Depression   . GERD (gastroesophageal reflux disease)   . Hyperlipidemia   . Hypertension   . Morbid obesity [facility])     Patient Active Problem List   Diagnosis Date Noted  . Systolic murmur 88/41/6606  . Internal hemorrhoid 10/13/2017  . Healthcare maintenance 10/17/2016  . Morbid obesity ([place]) 09/23/2016  . Primary localized osteoarthritis of knee 02/02/2016  . ANEMIA NOS 09/17/2007  . HYPERCHOLESTEROLEMIA 09/25/2006  . DEPRESSION 09/25/2006  . Essential hypertension 09/25/2006  . Moderate persistent asthma 09/25/2006  . GERD 09/25/2006    Past Surgical History:  Procedure Laterality Date  . CERVICAL DISCECTOMY  2002     OB History   None      Home Medications    Prior to Admission medications   Medication Sig Start Date End Date Taking? Authorizing Provider  aspirin 81 MG chewable tablet Chew 81 mg by mouth once.   Yes [provider]  atorvastatin  (LIPITOR) 20 MG tablet Take 1 tablet (20 mg total) by mouth daily. 02/19/18 02/19/19 Yes Velna Ochs, MD  beclomethasone (QVAR) 40 MCG/ACT inhaler Inhale 2 puffs into the lungs 2 (two) times daily. 02/19/18  Yes Velna Ochs, MD  hydrochlorothiazide (HYDRODIURIL) 25 MG tablet Take 1 tablet (25 mg total) by mouth daily. 02/19/18  Yes Velna Ochs, MD  PROVENTIL HFA 108 [phone] Base) MCG/ACT inhaler INHALE 2 PUFFS INTO THE LUNGS EVERY 6 HOURS AS NEEDED FOR WHEEZING OR SHORTNESS OF BREATH 01/25/18  Yes Velna Ochs, MD  benzonatate (TESSALON) 100 MG capsule Take 2 capsules (200 mg total) by mouth 3 (three) times daily as needed for cough. Patient not taking: Reported on 03/08/2018 03/19/17   Lysbeth Penner, FNP  ipratropium (ATROVENT) 0.06 % nasal spray Place 2 sprays into both nostrils 4 (four) times daily. Patient not taking: Reported on 03/08/2018 03/19/17   Lysbeth Penner, FNP  Olopatadine HCl 0.2 % SOLN One drop per eye qd Patient not taking: Reported on 03/08/2018 03/19/17   Lysbeth Penner, FNP    Family History Family History  Problem Relation Age of Onset  . Hypertension Mother   . Hyperlipidemia Mother   . Heart disease Father   . Hyperlipidemia Father   . Hypertension Father   . Hypertension Brother   . Heart disease Brother   . Hypertension Daughter   . Diabetes Sister  Social History Social History   Tobacco Use  . Smoking status: Never Smoker  Substance Use Topics  . Alcohol use: No  . Drug use: No     Allergies   Patient has no known allergies.   Review of Systems Review of Systems  All other systems reviewed and are negative.    Physical Exam Updated Vital Signs BP (!) 161/78   Pulse 100   Temp 98.4 F (36.9 C) (Oral)   Resp (!) 21   Ht 1.727 m (5\' 8" )   Wt (!) 145.2 kg (320 lb)   SpO2 98%   BMI 48.66 kg/m   Physical Exam  Constitutional: She appears well-developed and well-nourished. No distress.  HENT:  Head: Normocephalic  and atraumatic.  Right Ear: External ear normal.  Left Ear: External ear normal.  Eyes: Conjunctivae are normal. Right eye exhibits no discharge. Left eye exhibits no discharge. No scleral icterus.  Neck: Neck supple. No tracheal deviation present.  Cardiovascular: Normal rate, regular rhythm and intact distal pulses.  Pulmonary/Chest: Effort normal and breath sounds normal. No stridor. No respiratory distress. She has no wheezes. She has no rales.  Abdominal: Soft. Bowel sounds are normal. She exhibits no distension. There is no tenderness. There is no rebound and no guarding.  Musculoskeletal: She exhibits no edema or tenderness.  Neurological: She is alert. She has normal strength. No cranial nerve deficit (no facial droop, extraocular movements intact, no slurred speech) or sensory deficit. She exhibits normal muscle tone. She displays no seizure activity. Coordination normal.  Normal strength and sensation throughout, normal coordination  Skin: Skin is warm and dry. No rash noted.  Psychiatric: She has a normal mood and affect.  Nursing note and vitals reviewed.    ED Treatments / Results  Labs (all labs ordered are listed, but only abnormal results are displayed) Labs Reviewed  CBG MONITORING, ED - Abnormal; Notable for the following components:      Result Value   Glucose-Capillary 170 (*)    All other components within normal limits  BASIC METABOLIC PANEL  CBC  I-STAT TROPONIN, ED    EKG EKG Interpretation  Date/Time:  Thursday March 08 2018 21:41:07 EDT Ventricular Rate:  108 PR Interval:    QRS Duration: 79 QT Interval:  336 QTC Calculation: 451 R Axis:   31 Text Interpretation:  Sinus tachycardia Multiple ventricular premature complexes RSR' in V1 or V2, right VCD or RVH No significant change since last tracing except pvc Confirmed by Dorie Rank [phone]) on 03/08/2018 11:01:03 PM   Radiology Pending   Procedures Procedures (including critical care  time)  Medications Ordered in ED Medications  aspirin chewable tablet 324 mg (324 mg Oral Given 03/08/18 2337)  acetaminophen (TYLENOL) tablet 650 mg (650 mg Oral Given 03/08/18 2337)     Initial Impression / Assessment and Plan / ED Course  I have reviewed the triage vital signs and the nursing notes.  Pertinent labs & imaging results that were available during my care of the patient were reviewed by me and considered in my medical decision making (see chart for details).  Clinical Course as of Mar 08 2346  Thu Mar 08, 2018  2300 I discussed doing a head CT however the patient states her headache is not that bad right now.  SHe does not feel that is necessary   [JK]  2346 Overall low risk for MACE by heart score.  Sx atypical.  Will plan on labs, delta troponin.  Will  turn over case to Dr. Wyvonnia Dusky   [JK]    Clinical Course User Index [JK] Dorie Rank, MD      Final Clinical Impressions(s) / ED Diagnoses  pending   Dorie Rank, MD 03/08/18 2351

## 2018-03-09 ENCOUNTER — Telehealth (INDEPENDENT_AMBULATORY_CARE_PROVIDER_SITE_OTHER): Payer: Self-pay

## 2018-03-09 ENCOUNTER — Emergency Department (HOSPITAL_COMMUNITY): Payer: Medicaid Other

## 2018-03-09 LAB — BASIC METABOLIC PANEL
Anion gap: 11 (ref 5–15)
BUN: 17 mg/dL (ref 6–20)
CHLORIDE: 104 mmol/L (ref 101–111)
CO2: 25 mmol/L (ref 22–32)
CREATININE: 0.74 mg/dL (ref 0.44–1.00)
Calcium: 9.8 mg/dL (ref 8.9–10.3)
GFR calc non Af Amer: >60 mL/min (ref >60)
GLUCOSE: 172 mg/dL — AB (ref 65–99)
Potassium: 4 mmol/L (ref 3.5–5.1)
Sodium: 140 mmol/L (ref 135–145)

## 2018-03-09 LAB — CBC
HCT: 35.5 % — ABNORMAL LOW (ref 36.0–46.0)
Hemoglobin: 12.4 g/dL (ref 12.0–15.0)
MCH: 28.5 pg (ref 26.0–34.0)
MCHC: 34.9 g/dL (ref 30.0–36.0)
MCV: 81.6 fL (ref 78.0–100.0)
PLATELETS: 322 10*3/uL (ref 150–400)
RBC: 4.35 MIL/uL (ref 3.87–5.11)
RDW: 13.6 % (ref 11.5–15.5)
WBC: 5.8 10*3/uL (ref 4.0–10.5)

## 2018-03-09 LAB — I-STAT TROPONIN, ED
Troponin i, poc: 0 ng/mL (ref 0.00–0.08)
Troponin i, poc: 0 ng/mL (ref 0.00–0.08)

## 2018-03-09 NOTE — Telephone Encounter (Signed)
-----   Message from Marlyne Beards, [place] sent at 03/09/2018 10:04 AM EDT ----- Jeneen Rinks would like this patient to have bilateral knee orthovisc injections, M17.11, M17.12.

## 2018-03-09 NOTE — ED Notes (Signed)
Bed: FG90 Expected date:  Expected time:  Means of arrival:  Comments: Pt in room

## 2018-03-09 NOTE — Telephone Encounter (Signed)
Message sent to Benjiman Core, PA to advise him that medicaid does not cover for gel injections.

## 2018-03-09 NOTE — Telephone Encounter (Signed)
Patient has Medicaid and insurance will not cover for Orthovisc Injection.  We do have a patient assistance program that the patient can try, if you would like to do that.  Please advise.  Thank you.

## 2018-03-09 NOTE — Discharge Instructions (Addendum)
There is no evidence of heart attack.  Take your blood pressure medications as prescribed and follow-up with your doctor.  Return to the ED if you develop new or worsening symptoms.

## 2018-03-09 NOTE — ED Provider Notes (Signed)
Sign out from Dr. Tomi Bamberger.  Patient awaiting second troponin after atypical chest and shoulder pain.  Gradual onset headache.  No neurological deficits.  Plan is for discharge home after second troponin.  This is negative.  Patient denies any chest pain or shortness of breath.  Her blood pressure has improved to 151/97.  She does have some chest tenderness to palpation that is reproducible.  Low suspicion for ACS. CT head was obtained and is negative.  No evidence of subarachnoid hemorrhage or other acute pathology.  Discussed continue her blood pressure medications and following up with her PCP.  Return precautions discussed. She states she has her BP meds at home.   Ezequiel Essex, MD 03/09/18 [phone]

## 2018-03-12 ENCOUNTER — Telehealth (INDEPENDENT_AMBULATORY_CARE_PROVIDER_SITE_OTHER): Payer: Self-pay

## 2018-03-12 MED ORDER — METHYLPREDNISOLONE ACETATE 40 MG/ML IJ SUSP
40.0000 mg | INTRAMUSCULAR | Status: AC | PRN
  Administered 2018-03-08: 40 mg via INTRA_ARTICULAR

## 2018-03-12 MED ORDER — BUPIVACAINE HCL 0.25 % IJ SOLN
6.0000 mL | INTRAMUSCULAR | Status: AC | PRN
  Administered 2018-03-08: 6 mL via INTRA_ARTICULAR

## 2018-03-12 MED ORDER — LIDOCAINE HCL 1 % IJ SOLN
3.0000 mL | INTRAMUSCULAR | Status: AC | PRN
  Administered 2018-03-08: 3 mL

## 2018-03-12 NOTE — Telephone Encounter (Signed)
Ok to do. Thanks.  

## 2018-03-12 NOTE — Telephone Encounter (Signed)
Kayla Roberts Patient assistance program  Application mailed to patient.

## 2018-03-12 NOTE — Telephone Encounter (Signed)
Talked with patient and advised her that Medicaid does not pay for Orthovisc injection.  Advised patient that we have a Patient Assistance Program by the name of Wynetta Emery & Wynetta Emery that helps with patients who have Medicaid insurance and will only cover for Monovisc injection.  Patient agreed to complete information for [employer].  Will mail application to addressed listed.

## 2018-03-20 ENCOUNTER — Ambulatory Visit: Payer: Medicaid Other | Admitting: Dietician

## 2018-03-20 ENCOUNTER — Ambulatory Visit: Payer: Medicaid Other | Admitting: Internal Medicine

## 2018-03-20 ENCOUNTER — Other Ambulatory Visit: Payer: Self-pay

## 2018-03-20 VITALS — BP 145/69 | HR 72 | Temp 98.0°F | Ht 68.0 in | Wt 331.7 lb

## 2018-03-20 DIAGNOSIS — R011 Cardiac murmur, unspecified: Secondary | ICD-10-CM | POA: Diagnosis not present

## 2018-03-20 DIAGNOSIS — Z79899 Other long term (current) drug therapy: Secondary | ICD-10-CM

## 2018-03-20 DIAGNOSIS — R079 Chest pain, unspecified: Secondary | ICD-10-CM

## 2018-03-20 DIAGNOSIS — E669 Obesity, unspecified: Secondary | ICD-10-CM

## 2018-03-20 DIAGNOSIS — Z6841 Body Mass Index (BMI) 40.0 and over, adult: Secondary | ICD-10-CM

## 2018-03-20 DIAGNOSIS — M170 Bilateral primary osteoarthritis of knee: Secondary | ICD-10-CM

## 2018-03-20 DIAGNOSIS — R42 Dizziness and giddiness: Secondary | ICD-10-CM | POA: Diagnosis not present

## 2018-03-20 DIAGNOSIS — I10 Essential (primary) hypertension: Secondary | ICD-10-CM | POA: Diagnosis not present

## 2018-03-20 MED ORDER — LISINOPRIL 10 MG PO TABS: 10.0000 mg | ORAL_TABLET | Freq: Every day | ORAL | 2 refills | Status: DC

## 2018-03-20 MED ORDER — CHLORTHALIDONE 50 MG PO TABS: 50.0000 mg | ORAL_TABLET | Freq: Every day | ORAL | 2 refills | Status: DC

## 2018-03-20 NOTE — Assessment & Plan Note (Signed)
Patient was recently evaluated by orthopedic surgery for bilateral knee pain secondary to osteoarthritis.  She received bilateral steroid injections.  Instructed to lose weight prior to knee replacement surgery.  Her pain improved following injections and she attempted to increase her physical activity by walking.  However while walking, she reports she developed symptoms of chest pain, dizziness, and tachycardia.  She presented to the ED.  ACS was ruled out with EKG and serial troponins.  Chest pain resolved without intervention.  She denies any further episodes, however has not attempted walking long distances since then. Overall she is at least at moderate risk for CAD given her hypertension and obesity.  She is agreeable to cardiology referral for ischemic work-up.  Would likely benefit from a stress test. --Cardiology referral placed

## 2018-03-20 NOTE — Patient Instructions (Signed)
FOLLOW-UP INSTRUCTIONS When: 1 month or PCP next available For: BP follow up What to bring: Medications   Kayla Roberts,  It was a pleasure to see you again. I have changed your blood pressure medicine. I would like for you to stop taking hydrochlorothiazide. Instead, please start taking chlorthalidone 50 mg once daily, and lisinopril 10 mg daily. I have also referred you to cardiology for evaluation of your heart and chest pain. Please keep your appointment for your echocardiogram tomorrow, I will call you with these results. Please follow up with me again in 1 month for blood pressure recheck. If you have any questions or concerns, call our clinic at [phone] or after hours call [phone] and ask for the internal medicine resident on call. Thank you!  - Dr. Philipp Ovens

## 2018-03-20 NOTE — Progress Notes (Signed)
Medical Nutrition Therapy:  Appt start time: 1500 end time:  1600. Visit # 1  Assessment:  Primary concerns today: weight loss  In  1999 she hurt her back, had  back surgery, back is better but now her knees. Participated in 1 year of "weight loss forever" in 2002 and lost 80 pounds, 280-200#. Gained it all back and more over the years. Wants to decrease her weight to feel better and be around for her family. She has supportive daughter who is buying her a blender and bought her protein powder. She has limited resources.   Activity- walks with cane, has a pool where she lives  Preferred Learning Style: readings, watch tv, computer Learning Readiness:Ready "9", but has not tried recently to make changes  ANTHROPOMETRICS: Estimated body mass index is 50.43 kg/m as calculated from the following:   Height as of an earlier encounter on 03/20/18: 5\' 8"  (1.727 m).   Weight as of an earlier encounter on 03/20/18: 331 lb 11.2 oz (150.5 kg).  WEIGHT HISTORY: Highest: 332# Lowest 302# SLEEP: MEDICATIONS: Takes aleive 2 pills a few times a week Herbs omega 3 - 300 mg each- 3 daily ; b12- 1 daily (200) ;vitamin e 1 (500 iu) daily Current Outpatient Medications on File Prior to Visit  Medication Sig Dispense Refill  . aspirin 81 MG chewable tablet Chew 81 mg by mouth once.    Marland Kitchen atorvastatin (LIPITOR) 20 MG tablet Take 1 tablet (20 mg total) by mouth daily. 30 tablet 11  . beclomethasone (QVAR) 40 MCG/ACT inhaler Inhale 2 puffs into the lungs 2 (two) times daily. 1 Inhaler 2  . benzonatate (TESSALON) 100 MG capsule Take 2 capsules (200 mg total) by mouth 3 (three) times daily as needed for cough. (Patient not taking: Reported on 03/08/2018) 21 capsule 0  . chlorthalidone (HYGROTON) 50 MG tablet Take 1 tablet (50 mg total) by mouth daily. 30 tablet 2  . ipratropium (ATROVENT) 0.06 % nasal spray Place 2 sprays into both nostrils 4 (four) times daily. (Patient not taking: Reported on 03/08/2018) 15 mL 0  .  lisinopril (PRINIVIL,ZESTRIL) 10 MG tablet Take 1 tablet (10 mg total) by mouth daily. 30 tablet 2  . Olopatadine HCl 0.2 % SOLN One drop per eye qd (Patient not taking: Reported on 03/08/2018) 1 Bottle 0  . PROVENTIL HFA 108 (90 Base) MCG/ACT inhaler INHALE 2 PUFFS INTO THE LUNGS EVERY 6 HOURS AS NEEDED FOR WHEEZING OR SHORTNESS OF BREATH 6.7 g 0   No current facility-administered medications on file prior to visit.      DIETARY INTAKE: Usual eating pattern includes 1-2 meals and 0-2 snacks per day. Everyday foods include apples, pineaple, banana, fruit juice.  Avoided foods include bread, sweets, snacks . Fasts for religion weekly to 5 days a week- no water, no food from 6 am -6 pm;   Food Intolerances: none;  Constipation: yes- drinks prune juice, 3 times a week,  For a while, not years, started 2016; Any hair loss: no; Dining Out (times/week): need to assess at future visit 24-hr recall:  L (12-1 PM): cabbage, sausage, rice Snk ( PM): fruit D ( 5  PM): cabbage, sausage & rice Snk ( PM): fruit Beverages: water, tea, green tea, coffee- no sugar, 1 teaspoon sugar , juice 12 oz/day, milk once in a while  Estimated energy needs: 1500-1800 calories  Progress Towards Goal(s):  In progress.   Nutritional Diagnosis:  NB-1.1 Food and nutrition-related knowledge deficit As related to lack  of sufficient prior weight loss training and support.  As evidenced by her report, quetions and concerns.    Intervention:  Nutrition education about weight loss, calorie counting , label reading, changing behavior, keeping a food record. Coordination of care: consider weight loss medications or surgery  Teaching Method Utilized: Visual,Auditory,,Hands on Handouts given during visit include: food records, calroie counting resources. AVS Barriers to learning/adherence to lifestyle change: competing values Demonstrated degree of understanding via:  Teach Back   Monitoring/Evaluation:  Dietary intake,  exercise,food records, and body weight in 3 week(s). Debera Lat, RD 03/21/2018 2:11 PM.

## 2018-03-20 NOTE — Assessment & Plan Note (Signed)
Patient is here for blood pressure follow-up.  She is currently uncontrolled on HCTZ 25 mg daily.  Blood pressure today is 162/72.  He did improve on recheck to 145/69.  Patient also had a recent ED visit during which she was noted to be hypertensive, 161/78. She was previously on lisinopril but stopped this due to side effect of headache. She was transitioned to losartan but again stopped this medication because of chest pain. Today, she is agreeable to another trial of lisinopril. We also discussed changing her HCTZ to chlorthalidone for longer duration of coverage increasing dose to 50 mg. She is agreeable to this plan. I have instructed patient to call with any side effects prior to discontinuing medications on her own.  -- Change HCTZ to Chlorthalidone 50 mg daily  -- Restart lisinopril 10 mg daily  -- Follow up 1 month

## 2018-03-20 NOTE — Patient Instructions (Addendum)
Changes to help with weight loss: 1- eat 3 times a day balanced meals  2- buy the foods I want to eat- eggs, try to find a healthier way to season cabbage- ham or smoked [place] or low fat sausage Fish of all kinds is great- grilled, poached, oven fried- bake at higher temp so it gets crispy  Juice- limit to 4 ounces a day-  I suggest more water, can flavor your water with juice, or juice drink 4 oz a day   Smoothies are fine at times when you don't want a meal- fruit, vegetables, and protein  I plan to swim     For 50     minutes,   3  times per    week  Your Exercise Prescription:   RX:  At least 150 minutes a week of moderate intensity exercise. Walking, swimming, bicycling, stationary bicycling, dancing.  (moderate intensity means to get  a little out of breath)  Do resistance exercise at least 2 times a week.  This can be yoga poses or strength training where you lift your own weight (think leg lifts or toe raises)  or light weights like cans of beans with your arms.   Flexibility and balance exercise- safe stretching and practicing balance is very important to health.    What exercise I did For how many minutes  Monday    Tuesday    Wednesday    Thursday    Friday    Saturday    Sunday           Calorie Counting for Weight Loss Calories are units of energy. Your body needs a certain amount of calories from food to keep you going throughout the day. When you eat more calories than your body needs, your body stores the extra calories as fat. When you eat fewer calories than your body needs, your body burns fat to get the energy it needs. Calorie counting means keeping track of how many calories you eat and drink each day. Calorie counting can be helpful if you need to lose weight. If you make sure to eat fewer calories than your body needs, you should lose weight. Ask your health care provider what a healthy weight is for you. For calorie counting to work, you will need to eat  the right number of calories in a day in order to lose a healthy amount of weight per week. A dietitian can help you determine how many calories you need in a day and will give you suggestions on how to reach your calorie goal.  A healthy amount of weight to lose per week is usually 1-2 lb (0.5-0.9 kg). This usually means that your daily calorie intake should be reduced by 500-750 calories.  Eating 1,200 - 1,500 calories per day can help most women lose weight.  Eating 1,500 - 1,800 calories per day can help most men lose weight.  What is my plan? My goal is to have __________ calories per day. If I have this many calories per day, I should lose around __________ pounds per week. What do I need to know about calorie counting? In order to meet your daily calorie goal, you will need to:  Find out how many calories are in each food you would like to eat. Try to do this before you eat.  Decide how much of the food you plan to eat.  Write down what you ate and how many calories it had. Doing this is called  keeping a food log.  To successfully lose weight, it is important to balance calorie counting with a healthy lifestyle that includes regular activity. Aim for 150 minutes of moderate exercise (such as walking) or 75 minutes of vigorous exercise (such as running) each week. Where do I find calorie information?  The number of calories in a food can be found on a Nutrition Facts label. If a food does not have a Nutrition Facts label, try to look up the calories online or ask your dietitian for help. Remember that calories are listed per serving. If you choose to have more than one serving of a food, you will have to multiply the calories per serving by the amount of servings you plan to eat. For example, the label on a package of bread might say that a serving size is 1 slice and that there are 90 calories in a serving. If you eat 1 slice, you will have eaten 90 calories. If you eat 2 slices, you  will have eaten 180 calories. How do I keep a food log? Immediately after each meal, record the following information in your food log:  What you ate. Don't forget to include toppings, sauces, and other extras on the food.  How much you ate. This can be measured in cups, ounces, or number of items.  How many calories each food and drink had.  The total number of calories in the meal.  Keep your food log near you, such as in a small notebook in your pocket, or use a mobile app or website. Some programs will calculate calories for you and show you how many calories you have left for the day to meet your goal. What are some calorie counting tips?  Use your calories on foods and drinks that will fill you up and not leave you hungry: ? Some examples of foods that fill you up are nuts and nut butters, vegetables, lean proteins, and high-fiber foods like whole grains. High-fiber foods are foods with more than 5 g fiber per serving. ? Drinks such as sodas, specialty coffee drinks, alcohol, and juices have a lot of calories, yet do not fill you up.  Eat nutritious foods and avoid empty calories. Empty calories are calories you get from foods or beverages that do not have many vitamins or protein, such as candy, sweets, and soda. It is better to have a nutritious high-calorie food (such as an avocado) than a food with few nutrients (such as a bag of chips).  Know how many calories are in the foods you eat most often. This will help you calculate calorie counts faster.  Pay attention to calories in drinks. Low-calorie drinks include water and unsweetened drinks.  Pay attention to nutrition labels for "low fat" or "fat free" foods. These foods sometimes have the same amount of calories or more calories than the full fat versions. They also often have added sugar, starch, or salt, to make up for flavor that was removed with the fat.  Find a way of tracking calories that works for you. Get creative. Try  different apps or programs if writing down calories does not work for you. What are some portion control tips?  Know how many calories are in a serving. This will help you know how many servings of a certain food you can have.  Use a measuring cup to measure serving sizes. You could also try weighing out portions on a kitchen scale. With time, you will be able to  estimate serving sizes for some foods.  Take some time to put servings of different foods on your favorite plates, bowls, and cups so you know what a serving looks like.  Try not to eat straight from a bag or box. Doing this can lead to overeating. Put the amount you would like to eat in a cup or on a plate to make sure you are eating the right portion.  Use smaller plates, glasses, and bowls to prevent overeating.  Try not to multitask (for example, watch TV or use your computer) while eating. If it is time to eat, sit down at a table and enjoy your food. This will help you to know when you are full. It will also help you to be aware of what you are eating and how much you are eating. What are tips for following this plan? Reading food labels  Check the calorie count compared to the serving size. The serving size may be smaller than what you are used to eating.  Check the source of the calories. Make sure the food you are eating is high in vitamins and protein and low in saturated and trans fats. Shopping  Read nutrition labels while you shop. This will help you make healthy decisions before you decide to purchase your food.  Make a grocery list and stick to it. Cooking  Try to cook your favorite foods in a healthier way. For example, try baking instead of frying.  Use low-fat dairy products. Meal planning  Use more fruits and vegetables. Half of your plate should be fruits and vegetables.  Include lean proteins like poultry and fish. How do I count calories when eating out?  Ask for smaller portion sizes.  Consider  sharing an entree and sides instead of getting your own entree.  If you get your own entree, eat only half. Ask for a box at the beginning of your meal and put the rest of your entree in it so you are not tempted to eat it.  If calories are listed on the menu, choose the lower calorie options.  Choose dishes that include vegetables, fruits, whole grains, low-fat dairy products, and lean protein.  Choose items that are boiled, broiled, grilled, or steamed. Stay away from items that are buttered, battered, fried, or served with cream sauce. Items labeled "crispy" are usually fried, unless stated otherwise.  Choose water, low-fat milk, unsweetened iced tea, or other drinks without added sugar. If you want an alcoholic beverage, choose a lower calorie option such as a glass of wine or light beer.  Ask for dressings, sauces, and syrups on the side. These are usually high in calories, so you should limit the amount you eat.  If you want a salad, choose a garden salad and ask for grilled meats. Avoid extra toppings like bacon, cheese, or fried items. Ask for the dressing on the side, or ask for olive oil and vinegar or lemon to use as dressing.  Estimate how many servings of a food you are given. For example, a serving of cooked rice is  cup or about the size of half a baseball. Knowing serving sizes will help you be aware of how much food you are eating at restaurants. The list below tells you how big or small some common portion sizes are based on everyday objects: ? 1 oz-4 stacked dice. ? 3 oz-1 deck of cards. ? 1 tsp-1 die. ? 1 Tbsp- a ping-pong ball. ? 2 Tbsp-1 ping-pong ball. ?  cup- baseball. ? 1 cup-1 baseball. Summary  Calorie counting means keeping track of how many calories you eat and drink each day. If you eat fewer calories than your body needs, you should lose weight.  A healthy amount of weight to lose per week is usually 1-2 lb (0.5-0.9 kg). This usually means reducing your  daily calorie intake by 500-750 calories.  The number of calories in a food can be found on a Nutrition Facts label. If a food does not have a Nutrition Facts label, try to look up the calories online or ask your dietitian for help.  Use your calories on foods and drinks that will fill you up, and not on foods and drinks that will leave you hungry.  Use smaller plates, glasses, and bowls to prevent overeating. This information is not intended to replace advice given to you by your health care provider. Make sure you discuss any questions you have with your health care provider. Document Released: 11/14/2005 Document Revised: 10/14/2016 Document Reviewed: 10/14/2016 Elsevier Interactive Patient Education  [employer].

## 2018-03-20 NOTE — Progress Notes (Signed)
   CC: HTN follow up  HPI:  [name] is a 62 y.o. female with past medical history outlined below here for HTN follow up. For the details of today's visit, please refer to the assessment and plan.  Past Medical History:  Diagnosis Date  . Anxiety   . Asthma   . Depression   . GERD (gastroesophageal reflux disease)   . Hyperlipidemia   . Hypertension   . Morbid obesity ([place])     Review of Systems  Respiratory: Negative for shortness of breath.   Cardiovascular: Positive for chest pain.  Neurological: Positive for dizziness.    Physical Exam:  Vitals:   03/20/18 1401 03/20/18 1433  BP: (!) 162/72 (!) 145/69  Pulse: 78 72  Temp: 98 F (36.7 C)   TempSrc: Oral   SpO2: 98% 99%  Weight: (!) 331 lb 11.2 oz (150.5 kg)   Height: 5\' 8"  (1.727 m)     Constitutional: NAD, appears comfortable Cardiovascular: RRR, systolic murmur   Pulmonary/Chest: CTAB, no wheezes, rales, or rhonchi.  Extremities: Warm and well perfused. No edema.  Psychiatric: Normal mood and affect  Assessment & Plan:   See Encounters Tab for problem based charting.  Patient discussed with Dr. Evette Doffing

## 2018-03-21 ENCOUNTER — Ambulatory Visit (HOSPITAL_COMMUNITY): Payer: Medicaid Other

## 2018-03-21 NOTE — Progress Notes (Signed)
Internal Medicine Clinic Attending  Case discussed with Dr. Guilloud at the time of the visit.  We reviewed the resident's history and exam and pertinent patient test results.  I agree with the assessment, diagnosis, and plan of care documented in the resident's note.  

## 2018-03-30 ENCOUNTER — Ambulatory Visit (HOSPITAL_COMMUNITY)
Admission: RE | Admit: 2018-03-30 | Discharge: 2018-03-30 | Disposition: A | Discharge status: Home / Self Care | Payer: Medicare Other | Source: Ambulatory Visit | Attending: Internal Medicine | Admitting: Internal Medicine

## 2018-03-30 DIAGNOSIS — E785 Hyperlipidemia, unspecified: Secondary | ICD-10-CM | POA: Diagnosis not present

## 2018-03-30 DIAGNOSIS — I10 Essential (primary) hypertension: Secondary | ICD-10-CM | POA: Insufficient documentation

## 2018-03-30 DIAGNOSIS — R011 Cardiac murmur, unspecified: Secondary | ICD-10-CM | POA: Diagnosis not present

## 2018-03-30 NOTE — Progress Notes (Signed)
  Echocardiogram 2D Echocardiogram has been performed.  Austen Oyster T Trea Latner 03/30/2018, 2:08 PM

## 2018-03-30 NOTE — Telephone Encounter (Signed)
Patient brought back completed [employer] form today. It is in drawer up front.

## 2018-04-02 ENCOUNTER — Telehealth (INDEPENDENT_AMBULATORY_CARE_PROVIDER_SITE_OTHER): Payer: Self-pay

## 2018-04-02 NOTE — Telephone Encounter (Signed)
Patient returned completed [facility] Patient Assistance application on Friday 12/02/50. Faxed completed [facility] Patient Assistance application to [phone].

## 2018-04-02 NOTE — Telephone Encounter (Signed)
Noted.  Will fax to [facility] Patient assistance

## 2018-04-04 ENCOUNTER — Ambulatory Visit: Payer: Medicaid Other | Admitting: Dietician

## 2018-04-05 NOTE — Progress Notes (Signed)
Called patient with echocardiogram results. Normal systolic function EF 44-46%, no LV strain, no wall motion abnormalities, grade 1 diastolic dysfunction, no significant valvular disease. Appointment was scheduled with cardiology on 4/24 for ischemic eval. Per Epic, it was canceled by patient. Patient says she was never contacted and did not know anything about the appointment. Will place another referral.

## 2018-04-09 ENCOUNTER — Ambulatory Visit: Payer: Medicare Other | Admitting: Dietician

## 2018-04-09 ENCOUNTER — Encounter: Payer: Self-pay | Admitting: Dietician

## 2018-04-09 NOTE — Progress Notes (Signed)
Medical Nutrition Therapy:  Appt start time: 4481 end time:  1130. Visit # 2  Assessment:  Primary concerns today: weight loss Ms. Blackham has been drinking more water, eating more vegetables, less sugar and rice. She has good retention of information discussed at her last visit. She has been reading labels for calories and verbalizes understanding to purpose in using labels to limit calories. She is eating 3 times a day now and is not too hungry.  She wants to be testing for diabetes Activity- walking as much as possible- knee pain is a 6/10 today, her pool is not open yet Her transportation is a problem right now with a broken car, however she dos not preceive her finances as being a barrier to her weight loss.   ANTHROPOMETRICS: Estimated body mass index is 49.95 kg/m as calculated from the following:   Height as of 03/20/18: 5\' 8"  (1.727 m).   Weight as of this encounter: 328 lb 8 oz (149 kg).  MEDICATIONS: Starleen Blue 2 pills a few times a week Herbs omega 3 - 300 mg each- 3 daily ; b12- 1 daily (200) ;vitamin e 1 (500 iu) daily  DIETARY INTAKE: Usual eating pattern includes 3 meals and 0-2 snacks per day. Everyday foods include apples, pineaple, banana, fruit juice.  Avoided foods include rice, sugar, juice, bread, sweets, snacks   Estimated energy needs: 1500-1800 calories  Progress Towards Goal(s):  In progress.   Nutritional Diagnosis:  NB-1.1 Food and nutrition-related knowledge deficit As related to lack of sufficient prior weight loss training and support.  As evidenced by her report, quetions and concerns.    Intervention:  Nutrition education about weight loss, calorie counting , label reading, changing behavior, keeping a food record.assess sleep at next visit.  Coordination of care: consider weight loss medications or surgery, referred patient to PCP to be tested for diabetes  Teaching Method Utilized: Visual,Auditory,Hands on Handouts given during visit include: food  records, calorie counting resources. AVS Barriers to learning/adherence to lifestyle change: competing values Demonstrated degree of understanding via:  Teach Back   Monitoring/Evaluation:  Dietary intake, exercise,food records, and body weight in 2 week(s). Debera Lat, RD 04/09/2018 11:32 AM.

## 2018-04-09 NOTE — Patient Instructions (Signed)
Writing down what we eat and drink can help [place] to see it in a different way. It may help [place] to change habits. What we eat and drink are habits formed during our lifetime. Habits are learned and can be unlearned and changed.   Please write down the foods and beverages you have during the day. Please include at least the time you eat and drink, what you eat and drink and how much you eat and drink. Includes as much detail as you can.  For example:  Time   What    How much 8 AM  Cooked oatmeal   1 Cup   Coffee     1 mug                         Sugar in oatmeal   1 teaspoon         Sugar in coffee   2 teaspoons   Milk in oatmeal   1/4 cup   Creamer in coffee    2 teaspoons   Margarine in oatmeal   1 teaspoon   See you in two weeks.  Butch Penny [phone]

## 2018-04-17 ENCOUNTER — Telehealth (INDEPENDENT_AMBULATORY_CARE_PROVIDER_SITE_OTHER): Payer: Self-pay

## 2018-04-17 NOTE — Telephone Encounter (Signed)
Submitted application online for Orthovisc Injection, bilateral knee due to patient now having Medicare insurance.

## 2018-04-24 ENCOUNTER — Ambulatory Visit: Payer: Medicare Other | Admitting: Dietician

## 2018-04-26 ENCOUNTER — Encounter: Payer: Self-pay | Admitting: Internal Medicine

## 2018-05-25 ENCOUNTER — Telehealth (INDEPENDENT_AMBULATORY_CARE_PROVIDER_SITE_OTHER): Payer: Self-pay

## 2018-05-25 NOTE — Telephone Encounter (Signed)
Tried calling patient to schedule appt.for Orthovisc injection, bilateral knee, but no answer and no VM setup to leave a message.  Will try again Later.  Patient is covered to have Orthovisc Injection, Bilateral knee. Buy & Bill No PA required Patient is covered at 80%, once the deductible is met, patient will be responsible for 20% OOP.

## 2018-06-08 ENCOUNTER — Telehealth (INDEPENDENT_AMBULATORY_CARE_PROVIDER_SITE_OTHER): Payer: Self-pay

## 2018-06-08 NOTE — Telephone Encounter (Signed)
Talked with patient and advised her that she is approved for Orthovisc injection series, bilateral knee. [place] After patient has met her deductible, patient will be responsible for 20% OOP. No PA required.  Appt.scheduled 06/13/18

## 2018-06-13 ENCOUNTER — Ambulatory Visit (INDEPENDENT_AMBULATORY_CARE_PROVIDER_SITE_OTHER): Payer: Medicare Other | Admitting: Surgery

## 2018-06-13 ENCOUNTER — Encounter (INDEPENDENT_AMBULATORY_CARE_PROVIDER_SITE_OTHER): Payer: Self-pay | Admitting: Surgery

## 2018-06-13 VITALS — BP 157/85 | HR 79 | Ht 68.0 in | Wt 329.0 lb

## 2018-06-13 DIAGNOSIS — M1712 Unilateral primary osteoarthritis, left knee: Secondary | ICD-10-CM

## 2018-06-13 DIAGNOSIS — M1711 Unilateral primary osteoarthritis, right knee: Secondary | ICD-10-CM

## 2018-06-19 ENCOUNTER — Other Ambulatory Visit: Payer: Self-pay | Admitting: Internal Medicine

## 2018-06-19 DIAGNOSIS — I10 Essential (primary) hypertension: Secondary | ICD-10-CM

## 2018-06-19 DIAGNOSIS — J4540 Moderate persistent asthma, uncomplicated: Secondary | ICD-10-CM

## 2018-06-20 ENCOUNTER — Encounter (INDEPENDENT_AMBULATORY_CARE_PROVIDER_SITE_OTHER): Payer: Self-pay | Admitting: Surgery

## 2018-06-20 ENCOUNTER — Ambulatory Visit (INDEPENDENT_AMBULATORY_CARE_PROVIDER_SITE_OTHER): Payer: Medicare Other | Admitting: Surgery

## 2018-06-20 VITALS — BP 152/89 | HR 72 | Ht 68.0 in | Wt 329.0 lb

## 2018-06-20 DIAGNOSIS — M1711 Unilateral primary osteoarthritis, right knee: Secondary | ICD-10-CM

## 2018-06-20 DIAGNOSIS — M1712 Unilateral primary osteoarthritis, left knee: Secondary | ICD-10-CM

## 2018-06-20 MED ORDER — LIDOCAINE HCL 1 % IJ SOLN
3.0000 mL | INTRAMUSCULAR | Status: AC | PRN
  Administered 2018-06-20: 3 mL

## 2018-06-20 MED ORDER — HYALURONAN 30 MG/2ML IX SOSY
30.0000 mg | PREFILLED_SYRINGE | INTRA_ARTICULAR | Status: AC | PRN
  Administered 2018-06-20: 30 mg via INTRA_ARTICULAR

## 2018-06-20 NOTE — Progress Notes (Signed)
62 year old white female history of bilateral knee DJD returns for Orthovisc injections #2 of 3 both knees.  States that she is doing well.  Has noticed some improvement.  No adverse reaction.  Exam Gait is somewhat antalgic.  Bile knees joint nontender.  Positive crepitus.   Plan After patient consent bilateral knee with the [place] injections #2 3 performed.  Patient will follow with me in 1 week for final injections.  Procedure Note  Patient: Kayla Roberts             Date of Birth: February 27, 1956           MRN: [id number]             Visit Date: 06/20/2018  Procedures: Visit Diagnoses: Arthritis of right knee  Arthritis of left knee  Large Joint Inj: bilateral knee on 06/20/2018 10:18 AM Indications: pain Details: 25 G 1.5 in needle, anteromedial approach Medications (Right): 3 mL lidocaine 1 %; 30 mg Hyaluronan 30 MG/2ML Aspirate (Right): clear Medications (Left): 3 mL lidocaine 1 %; 30 mg Hyaluronan 30 MG/2ML Aspirate (Left): clear Consent was given by the patient. Patient was prepped and draped in the usual sterile fashion.

## 2018-06-26 ENCOUNTER — Ambulatory Visit (INDEPENDENT_AMBULATORY_CARE_PROVIDER_SITE_OTHER): Payer: Medicare Other | Admitting: Internal Medicine

## 2018-06-26 ENCOUNTER — Other Ambulatory Visit: Payer: Self-pay

## 2018-06-26 ENCOUNTER — Telehealth: Payer: Self-pay | Admitting: *Deleted

## 2018-06-26 VITALS — BP 148/95 | HR 90 | Temp 98.1°F | Wt 328.4 lb

## 2018-06-26 DIAGNOSIS — Z6841 Body Mass Index (BMI) 40.0 and over, adult: Secondary | ICD-10-CM | POA: Diagnosis not present

## 2018-06-26 DIAGNOSIS — K219 Gastro-esophageal reflux disease without esophagitis: Secondary | ICD-10-CM

## 2018-06-26 DIAGNOSIS — R05 Cough: Secondary | ICD-10-CM | POA: Diagnosis not present

## 2018-06-26 DIAGNOSIS — J4540 Moderate persistent asthma, uncomplicated: Secondary | ICD-10-CM

## 2018-06-26 DIAGNOSIS — R058 Other specified cough: Secondary | ICD-10-CM | POA: Insufficient documentation

## 2018-06-26 DIAGNOSIS — E669 Obesity, unspecified: Secondary | ICD-10-CM

## 2018-06-26 DIAGNOSIS — I10 Essential (primary) hypertension: Secondary | ICD-10-CM

## 2018-06-26 DIAGNOSIS — Z79899 Other long term (current) drug therapy: Secondary | ICD-10-CM | POA: Diagnosis not present

## 2018-06-26 MED ORDER — GUAIFENESIN-CODEINE 100-10 MG/5ML PO SOLN: 5.0000 mL | Freq: Every evening | ORAL | 0 refills | Status: DC | PRN

## 2018-06-26 MED ORDER — CETIRIZINE HCL 10 MG PO CAPS: 10.0000 mg | ORAL_CAPSULE | Freq: Every day | ORAL | 3 refills | Status: DC

## 2018-06-26 MED ORDER — GUAIFENESIN-CODEINE 100-10 MG/5ML PO SOLN: 5.0000 mL | Freq: Three times a day (TID) | ORAL | 0 refills | Status: DC | PRN

## 2018-06-26 NOTE — Addendum Note (Signed)
Addended by: Oda Kilts on: 06/26/2018 01:43 PM   Modules accepted: Orders

## 2018-06-26 NOTE — Telephone Encounter (Signed)
SPOKE WITH PATIENT, GAVE HER THE PHONE NUMBER TO CONTACT THE [facility] OFFICE @ [phone]. OFFICE HAD BEEN TRYING TO CONTACT HER. PATIENT STATED SHE NEVER GOT THE LETTER THAT WAS MAILED TO HER FROM OUR OFFICE. PATIENT WAS ABLE TO REPEAT BACK TO ME THE PHONE NUMBER.

## 2018-06-26 NOTE — Patient Instructions (Addendum)
It was nice seeing you today. Thank you for choosing [facility] for your Primary Care.   Today we talked about:  1) Cough: I think it's a leftover cough from the cold you had last month. This is an annoying thing and can linger for several months after a cold. I will prescribe you a cough syrup with codeine in it. This can make you drowsy so be careful about taking it during the day if you are going to be driving.   We will also start you on an allergy medicine cetirizine (Zyrtec). This will help if some of your symptoms are caused by allergies or asthma.This medicine can be bought over the counter or I can send in a prescription and you can compare costs.     FOLLOW-UP INSTRUCTIONS When: September with PCP For: cough and BP What to bring: all medications  Please contact the clinic if you have any problems, or need to be seen sooner.

## 2018-06-26 NOTE — Addendum Note (Signed)
Addended by: Isabelle Course on: 06/26/2018 11:58 AM   Modules accepted: Orders

## 2018-06-26 NOTE — Addendum Note (Signed)
Addended by: Isabelle Course on: 06/26/2018 01:07 PM   Modules accepted: Orders

## 2018-06-26 NOTE — Assessment & Plan Note (Signed)
2 weeks of persistent cough after a URI.  Worse at night.  Does not respond to albuterol or NyQuil.  Patient denies fevers, shortness of breath, chest pain.  Patient looks well, lung exam with mild expiratory wheezes in right lower lung fields, no crackles.  Most likely post viral cough syndrome.  Less likely asthma exacerbation since albuterol does not help but I wonder if she could benefit from an allergy medication.  I do not think she has pneumonia given absence of crackles on exam, no shortness of breath or purulent mucus production and no fevers.  Plan: - Guaifenesin codeine cough syrup.  Instructed patient to use at night as it can make her drowsy -Start cetirizine for allergies  - f/u 1-2 months with PCP

## 2018-06-26 NOTE — Progress Notes (Signed)
Internal Medicine Clinic Attending  I saw and evaluated the patient.  I personally confirmed the key portions of the history and exam documented by Dr. Donne Hazel and I reviewed pertinent patient test results.  The assessment, diagnosis, and plan were formulated together and I agree with the documentation in the resident's note.  Here with persistent cough after URI in the context of moderate persistent asthma. No respiratory distress today and no dyspnea at home. Likely post-viral cough, although nasal and eye symptoms suggest possible allergic component with postnasal drip. No evidence of asthma exacerbation, does not respond to albuterol, continues on QVar. Will trial cetirizine and give short course of codeine cough syrup as cough is keeping her up at night. If not resolved in the next few weeks, may need further evaluation or consideration of other etiologies such as GERD or uncontrolled asthma.   Lenice Pressman, M.D., Ph.D.

## 2018-06-26 NOTE — Progress Notes (Signed)
   CC: Cough  HPI:  Kayla Roberts is a 62 y.o. female with hypertension, asthma, GERD, obesity presents for evaluation of a cough.  He states that 1 month ago she you travel to [place] and got a cold.  Symptoms included sinus congestion, sneezing, cough productive of clear mucus.  She took over-the-counter medications such as DayQuil, Mucinex, NyQuil and her symptoms resolved after 2 weeks.  However, since that time she has had an intermittent cough.  She states that occasionally throughout the day her throat will get itchy and she will start coughing and her left eye will water.  She states this is embarrassing as people think she is crying.  The cough is worse at night and is associated with wheezing,  But Albuterol does not help the cough.   She denies shortness of breath, chest pain, fevers, chills, nausea vomiting or diarrhea.  Past Medical History:  Diagnosis Date  . Anxiety   . Asthma   . Depression   . GERD (gastroesophageal reflux disease)   . Hyperlipidemia   . Hypertension   . Morbid obesity ([name])     Physical Exam:  Vitals:   06/26/18 0841  BP: (!) 148/95  Pulse: 90  Temp: 98.1 F (36.7 C)  TempSrc: Oral  SpO2: 100%  Weight: (!) 328 lb 6.4 oz (149 kg)   Gen: Well appearing, NAD ENT: OP clear without erythema or exudate.  CV: RRR, no murmurs Pulm: Normal effort, no crackles, mild expiratory wheezing in right lower lung field   Assessment & Plan:   See Encounters Tab for problem based charting.  Patient seen with Dr. Rebeca Alert

## 2018-06-26 NOTE — Assessment & Plan Note (Addendum)
Presented with post viral cough syndrome.  Less likely asthma exacerbation but patient may benefit from an allergy medication.  Plan: -Start Zyrtec (cetirizine) 10 mg daily  - continue Qvar daily and albuterol prn - f/u 1-2 months with PCP

## 2018-06-27 ENCOUNTER — Ambulatory Visit (INDEPENDENT_AMBULATORY_CARE_PROVIDER_SITE_OTHER): Payer: Medicare Other | Admitting: Surgery

## 2018-06-27 ENCOUNTER — Encounter (INDEPENDENT_AMBULATORY_CARE_PROVIDER_SITE_OTHER): Payer: Self-pay | Admitting: Surgery

## 2018-06-27 DIAGNOSIS — M170 Bilateral primary osteoarthritis of knee: Secondary | ICD-10-CM | POA: Diagnosis not present

## 2018-06-27 MED ORDER — HYALURONAN 30 MG/2ML IX SOSY
30.0000 mg | PREFILLED_SYRINGE | INTRA_ARTICULAR | Status: AC | PRN
  Administered 2018-06-27: 30 mg via INTRA_ARTICULAR

## 2018-06-27 MED ORDER — LIDOCAINE HCL 1 % IJ SOLN
3.0000 mL | INTRAMUSCULAR | Status: AC | PRN
  Administered 2018-06-27: 3 mL

## 2018-06-27 NOTE — Progress Notes (Signed)
        Procedure Note  Patient: [name]             Date of Birth: 1956-11-03           MRN: [id number]             Visit Date: 06/27/2018  Procedures: Visit Diagnoses: No diagnosis found.  Large Joint Inj: bilateral knee on 06/27/2018 10:45 AM Indications: pain Details: 25 G needle, anteromedial approach Medications (Right): 3 mL lidocaine 1 %; 30 mg Hyaluronan 30 MG/2ML Medications (Left): 3 mL lidocaine 1 %; 30 mg Hyaluronan 30 MG/2ML Outcome: tolerated well, no immediate complications Consent was given by the patient.     62 year old female history of bilateral knee DJD returns for her third and final Orthovisc injections.  States that she is doing well.  Has noticed some getting improvement up to this point.  Pain not as bad.  Patient sent bilateral knees were prepped with Betadine and third and final bilateral knee Orthovisc injections were performed.  Tolerated without convocation.  Follow-up in 3 months for recheck.  If she is doing well at that time she may cancel before coming in.  If pain if acutely worsens over the next several weeks she will return sooner.

## 2018-06-29 ENCOUNTER — Ambulatory Visit: Payer: Medicare Other | Admitting: Cardiology

## 2018-06-29 NOTE — Progress Notes (Deleted)
Cardiology Office Note:    Date:  06/29/2018   ID:  Kayla Roberts, DOB 07-Jul-1956, MRN [id number]  PCP:  Velna Ochs, MD  Cardiologist:  No primary care provider on file.   Referring MD: Velna Ochs, MD    History of Present Illness:    Kayla Roberts is a 62 y.o. female with hypertension asthma GERD obesity with frequent cough.  Throat is itchy, left eye waters, people think she is crying.  Worse at night.  She was also in the emergency room on 03/08/2018 with chest pain.  She had atypical pain shoulder pain headache.  Negative troponins.  Low suspicion.  Past Medical History:  Diagnosis Date  . Anxiety   . Asthma   . Depression   . GERD (gastroesophageal reflux disease)   . Hyperlipidemia   . Hypertension   . Morbid obesity ([place])     Past Surgical History:  Procedure Laterality Date  . CERVICAL DISCECTOMY  2002    Current Medications: No outpatient medications have been marked as taking for the 06/29/18 encounter (Appointment) with Jerline Pain, MD.     Allergies:   Patient has no known allergies.   Social History   Socioeconomic History  . Marital status: Widowed    Spouse name: Not on file  . Number of children: Not on file  . Years of education: Not on file  . Highest education level: Not on file  Occupational History  . Not on file  Social Needs  . Financial resource strain: Not on file  . Food insecurity:    Worry: Not on file    Inability: Not on file  . Transportation needs:    Medical: Not on file    Non-medical: Not on file  Tobacco Use  . Smoking status: Never Smoker  . Smokeless tobacco: Never Used  Substance and Sexual Activity  . Alcohol use: No  . Drug use: No  . Sexual activity: Not on file  Lifestyle  . Physical activity:    Days per week: Not on file    Minutes per session: Not on file  . Stress: Not on file  Relationships  . Social connections:    Talks on phone: Not on file    Gets together: Not on file   Attends religious service: Not on file    Active member of club or organization: Not on file    Attends meetings of clubs or organizations: Not on file    Relationship status: Not on file  Other Topics Concern  . Not on file  Social History Narrative  . Not on file     Family History: The patient's ***family history includes Diabetes in her sister; Heart disease in her brother and father; Hyperlipidemia in her father and mother; Hypertension in her brother, daughter, father, and mother.  ROS:   Please see the history of present illness.    *** All other systems reviewed and are negative.  EKGs/Labs/Other Studies Reviewed:    The following studies were reviewed today: ***  EKG:  EKG is *** ordered today.  The ekg ordered today demonstrates ***  ECHO 03/30/18: - Left ventricle: Global longitudinal LV strain is normal at -21.9%   The cavity size was normal. There was mild concentric   hypertrophy. Systolic function was normal. The estimated ejection   fraction was in the range of 60% to 65%. Wall motion was normal;   there were no regional wall motion abnormalities. There was an   increased  relative contribution of atrial contraction to   ventricular filling. Doppler parameters are consistent with   abnormal left ventricular relaxation (grade 1 diastolic   dysfunction). - Aortic valve: Trileaflet; mildly thickened, mildly calcified   leaflets. - Mitral valve: Calcified annulus. - Tricuspid valve: There was trivial regurgitation. - Pulmonic valve: There was trivial regurgitation. - Pulmonary arteries: PA peak pressure: 32 mm Hg (S).  Recent Labs: 03/08/2018: BUN 17; Creatinine, Ser 0.74; Hemoglobin 12.4; Platelets 322; Potassium 4.0; Sodium 140  Recent Lipid Panel    Component Value Date/Time   CHOL 175 02/02/2016 1538   TRIG 61 02/02/2016 1538   HDL 84 02/02/2016 1538   CHOLHDL 2.1 02/02/2016 1538   VLDL 12 02/02/2016 1538   LDLCALC 79 02/02/2016 1538    Physical Exam:     VS:  There were no vitals taken for this visit.    Wt Readings from Last 3 Encounters:  06/26/18 (!) 328 lb 6.4 oz (149 kg)  06/20/18 (!) 329 lb (149.2 kg)  06/13/18 (!) 329 lb (149.2 kg)     GEN: *** Well nourished, well developed in no acute distress HEENT: Normal NECK: No JVD; No carotid bruits LYMPHATICS: No lymphadenopathy CARDIAC: ***RRR, no murmurs, rubs, gallops RESPIRATORY:  Clear to auscultation without rales, wheezing or rhonchi  ABDOMEN: Soft, non-tender, non-distended MUSCULOSKELETAL:  No edema; No deformity  SKIN: Warm and dry NEUROLOGIC:  Alert and oriented x 3 PSYCHIATRIC:  Normal affect   ASSESSMENT:    No diagnosis found. PLAN:    In order of problems listed above:  1. ***   Medication Adjustments/Labs and Tests Ordered: Current medicines are reviewed at length with the patient today.  Concerns regarding medicines are outlined above.  No orders of the defined types were placed in this encounter.  No orders of the defined types were placed in this encounter.   There are no Patient Instructions on file for this visit.   Signed, Candee Furbish, MD  06/29/2018 1:26 PM    [facility]

## 2018-07-31 DIAGNOSIS — M1711 Unilateral primary osteoarthritis, right knee: Secondary | ICD-10-CM | POA: Diagnosis not present

## 2018-07-31 DIAGNOSIS — M1712 Unilateral primary osteoarthritis, left knee: Secondary | ICD-10-CM

## 2018-07-31 MED ORDER — LIDOCAINE HCL 1 % IJ SOLN
3.0000 mL | INTRAMUSCULAR | Status: AC | PRN
  Administered 2018-07-31: 3 mL

## 2018-07-31 MED ORDER — HYALURONAN 30 MG/2ML IX SOSY
30.0000 mg | PREFILLED_SYRINGE | INTRA_ARTICULAR | Status: AC | PRN
  Administered 2018-07-31: 30 mg via INTRA_ARTICULAR

## 2018-07-31 NOTE — Progress Notes (Signed)
Patient with history of bilateral knee DJD returns.  She continues to have ongoing pain.  I performed bilateral knee intra-articular Marcaine/Depo-Medrol injections March 08, 2018.  She did not have long-lasting relief with that.  She understands that definitive treatment would be total knee replacements.  We have previously discussed Visco supplementation and she is wanting to try that.   Exam Pleasant black female alert and oriented in no acute distress.  Gait antalgic.  Bile knees positive crepitus.  Joint lines tender.  Some swelling without large effusions.   Plan Today we started Orthovisc series.  Patient sent bile knees were prepped with Betadine and Orthovisc injections #1 of 3 performed to both knees.  Follow-up in 1 week for injections #2.  Procedure Note  Patient: Kayla Roberts             Date of Birth: 11-Mar-1956           MRN: [id number]             Visit Date: 06/13/2018  Procedures: Visit Diagnoses: Arthritis of right knee  Arthritis of left knee  Large Joint Inj: bilateral knee on 07/31/2018 11:18 AM Indications: pain Details: 25 G 1.5 in needle, medial approach Medications (Right): 3 mL lidocaine 1 %; 30 mg Hyaluronan 30 MG/2ML Medications (Left): 3 mL lidocaine 1 %; 30 mg Hyaluronan 30 MG/2ML Consent was given by the patient. Patient was prepped and draped in the usual sterile fashion.

## 2018-08-04 ENCOUNTER — Other Ambulatory Visit: Payer: Self-pay | Admitting: Internal Medicine

## 2018-08-04 DIAGNOSIS — I10 Essential (primary) hypertension: Secondary | ICD-10-CM

## 2018-08-06 NOTE — Telephone Encounter (Signed)
According to most recent note, losartan stopped. Will not refill

## 2018-08-14 ENCOUNTER — Ambulatory Visit: Payer: Medicare Other | Admitting: Cardiology

## 2018-08-14 ENCOUNTER — Encounter

## 2018-09-10 ENCOUNTER — Encounter: Payer: Medicare Other | Admitting: Internal Medicine

## 2018-09-10 DIAGNOSIS — Z23 Encounter for immunization: Secondary | ICD-10-CM | POA: Diagnosis not present

## 2018-09-10 DIAGNOSIS — J4540 Moderate persistent asthma, uncomplicated: Secondary | ICD-10-CM | POA: Diagnosis not present

## 2018-09-10 DIAGNOSIS — J029 Acute pharyngitis, unspecified: Secondary | ICD-10-CM | POA: Diagnosis not present

## 2018-09-27 ENCOUNTER — Ambulatory Visit (INDEPENDENT_AMBULATORY_CARE_PROVIDER_SITE_OTHER): Payer: Medicare Other | Admitting: Surgery

## 2018-10-08 ENCOUNTER — Encounter: Payer: Self-pay | Admitting: Internal Medicine

## 2018-10-08 ENCOUNTER — Other Ambulatory Visit: Payer: Self-pay

## 2018-10-08 ENCOUNTER — Ambulatory Visit (INDEPENDENT_AMBULATORY_CARE_PROVIDER_SITE_OTHER): Payer: Medicare HMO | Admitting: Internal Medicine

## 2018-10-08 VITALS — BP 140/76 | HR 84 | Temp 98.3°F | Ht 68.0 in | Wt 334.8 lb

## 2018-10-08 DIAGNOSIS — Z6841 Body Mass Index (BMI) 40.0 and over, adult: Secondary | ICD-10-CM

## 2018-10-08 DIAGNOSIS — I10 Essential (primary) hypertension: Secondary | ICD-10-CM | POA: Diagnosis not present

## 2018-10-08 DIAGNOSIS — F329 Major depressive disorder, single episode, unspecified: Secondary | ICD-10-CM

## 2018-10-08 DIAGNOSIS — Z0000 Encounter for general adult medical examination without abnormal findings: Secondary | ICD-10-CM

## 2018-10-08 DIAGNOSIS — Z915 Personal history of self-harm: Secondary | ICD-10-CM | POA: Diagnosis not present

## 2018-10-08 DIAGNOSIS — Z23 Encounter for immunization: Secondary | ICD-10-CM | POA: Diagnosis not present

## 2018-10-08 DIAGNOSIS — F331 Major depressive disorder, recurrent, moderate: Secondary | ICD-10-CM | POA: Diagnosis not present

## 2018-10-08 DIAGNOSIS — Z79899 Other long term (current) drug therapy: Secondary | ICD-10-CM | POA: Diagnosis not present

## 2018-10-08 MED ORDER — LISINOPRIL 20 MG PO TABS: 20.0000 mg | ORAL_TABLET | Freq: Every day | ORAL | 2 refills | Status: DC

## 2018-10-08 NOTE — Assessment & Plan Note (Signed)
Patient is requesting to meet with our dietitian again. Referral placed.

## 2018-10-08 NOTE — Assessment & Plan Note (Addendum)
BP today is improved from prior but remains above goal, 147/74 and 140/76 on recheck. Currently taking Chlorthalidone 50 mg and lisinopril 10 mg daily. -- Increase lisinopril 20 mg daily  -- Continue Chlorthalidone 50 mg daily  -- Follow up 3 months

## 2018-10-08 NOTE — Progress Notes (Signed)
   CC: HTN follow up  HPI:  Kayla Roberts is a 62 y.o. female with past medical history outlined below here for HTN follow up. For the details of today's visit, please refer to the assessment and plan.  Past Medical History:  Diagnosis Date  . Anxiety   . Asthma   . Depression   . GERD (gastroesophageal reflux disease)   . Hyperlipidemia   . Hypertension   . Morbid obesity ([place])     Review of Systems  Psychiatric/Behavioral: Positive for depression. Negative for suicidal ideas.     Physical Exam:  Vitals:   10/08/18 1403 10/08/18 1430  BP: (!) 147/74 140/76  Pulse: 91 84  Temp: 98.3 F (36.8 C)   TempSrc: Oral   SpO2: 99%   Weight: (!) 334 lb 12.8 oz (151.9 kg)   Height: 5\' 8"  (1.727 m)     Constitutional: NAD, appears comfortable  Cardiovascular: RRR, no murmurs, rubs, or gallops.  Pulmonary/Chest: CTAB, no wheezes, rales, or rhonchi.  Extremities: Warm and well perfused.  No edema.  Skin: No rash Psychiatric: Depressed mood and affect  Assessment & Plan:   See Encounters Tab for problem based charting.  Patient discussed with Dr. Daryll Drown

## 2018-10-08 NOTE — Patient Instructions (Signed)
Ms. Kayla Roberts,  It was a pleasure to see you! Congratulations on your new grandchild! Please increase your lisinopril to 20 mg daily. You may take 2 tablets of your current prescription until you run out then start your new prescription. Follow up with me again in 3 months. If you have any questions or concerns, call our clinic at [phone] or after hours call [phone] and ask for the internal medicine resident on call. Thank you!  Dr. Philipp Ovens

## 2018-10-08 NOTE — Assessment & Plan Note (Addendum)
Patient has a history of MDD not in remission. Previously on prozac that she did not find helpful and discontinued. Patient scored a 10 on her PHQ-9 today, consistent moderate depression. However patient indicated that she has thoughts of hurting herself or that she would be better off dead on more than half the days over the past 2 weeks. When asked about this answer, patient burst into tears. Reports a prior suicide attempt in 2015 after her son and husband died. She says she drank a liter of wine and tried to hang herself from a door frame. She hung there for 3 hours before she was found by a neighbor. Reports the noose was not quite tall enough to support her full weight. She currently denies active suicidal ideation or plan, but does experienec a generalized death wish a few times a week. We discussed the treatment options for depression. She was previously on SSRI therapy which she did not find helpful. Depression is considered taboo in her culture (from [place]) and she does not have a good support system at home. She is open to counseling. Will refer for therapy. She has agreed to contact us if she develops suicidal thoughts. May require adjunctive pharmacologic therapy, but will start with counseling.  -- Refer for counseling

## 2018-10-08 NOTE — Assessment & Plan Note (Signed)
Patient received her flu shot recently while visiting her daughter in [place]. Gave PCV 13 and Tdap today in clinic. Referral placed for colonoscopy. Patient declined PAP smear, will make a separate appointment.

## 2018-10-09 ENCOUNTER — Encounter: Payer: Self-pay | Admitting: Internal Medicine

## 2018-10-11 NOTE — Progress Notes (Signed)
Internal Medicine Clinic Attending  Case discussed with Dr. Guilloud at the time of the visit.  We reviewed the resident's history and exam and pertinent patient test results.  I agree with the assessment, diagnosis, and plan of care documented in the resident's note.  

## 2018-10-22 ENCOUNTER — Telehealth: Payer: Self-pay | Admitting: Licensed Clinical Social Worker

## 2018-10-22 ENCOUNTER — Other Ambulatory Visit: Payer: Self-pay | Admitting: Internal Medicine

## 2018-10-22 DIAGNOSIS — F329 Major depressive disorder, single episode, unspecified: Secondary | ICD-10-CM

## 2018-10-22 NOTE — Telephone Encounter (Signed)
Patient was contacted due to a recent referral for [facility] services. Patient is scheduled for 10/30/18.

## 2018-10-22 NOTE — Progress Notes (Signed)
Placed referral for [facility].

## 2018-10-30 ENCOUNTER — Ambulatory Visit: Payer: Medicare HMO | Admitting: Licensed Clinical Social Worker

## 2018-11-02 ENCOUNTER — Encounter: Payer: Medicare HMO | Admitting: Dietician

## 2018-11-05 ENCOUNTER — Encounter: Payer: Medicare HMO | Admitting: Dietician

## 2018-11-06 ENCOUNTER — Ambulatory Visit: Payer: Medicare HMO | Admitting: Licensed Clinical Social Worker

## 2018-11-06 DIAGNOSIS — F321 Major depressive disorder, single episode, moderate: Secondary | ICD-10-CM

## 2018-11-06 NOTE — BH Specialist Note (Signed)
Integrated Behavioral Health Initial Visit  MRN: [id number] Name: Kayla Roberts  Number of [facility] visits:: 1/6 Session Start time: 2:20  Session End time: 3:00 Total time: 40 minutes  Type of Service: Integrated Behavioral Health- Individual/Family Interpretor:No.           SUBJECTIVE: Kayla Roberts is a 62 y.o. female y whom attended the session individually.  Patient was referred by Dr.Guilloud for depression. Patient reports the following symptoms/concerns: issues with grief, relationship struggles with family, sleep disturbances, fleeting SI (no plan or intent), lack of social interactions, and depression.  Duration of problem: over 5 years ; Severity of problem: moderate  OBJECTIVE: Mood: Depressed and Hopeless and Affect: Depressed and Tearful Risk of harm to self or others: Suicidal ideation (no plan or intent). Patient reported fleeting SI for years. Patient has not acted on any of her thoughts over the past five years. Patient reported that she would reach out to her prayer team at church if she began to struggle. Patient was provided crisis information.   LIFE CONTEXT: Family and Social: Patient lives with her oldest daughter. Patient has a positive relationship with her oldest daughter. Patient lost her husband unexpectedly in 28-Feb-2011, and her son also passed away in 02-28-2012. Patient is continuing to grieve the loss of her husband and son. Patient has a granddaughter that lives in [place], and feels it is important to have her granddaughter come live with her since her son's passing. Patient has a strained relationship with two of her daughters.  School/Work: Patient is currently on disability.  Self-Care: Patient enjoys playing games on her computer and talking to her granddaughter on the phone. Patient is involved in church.  Life Changes: Continuing to grieve the loss of her son and husband.   GOALS ADDRESSED: Patient will: 1. Reduce symptoms  of: depression, insomnia and stress 2. Increase knowledge and/or ability of: coping skills, stress reduction and grieve the loss of her husband and son.   3. Demonstrate ability to: Increase healthy adjustment to current life circumstances, Increase adequate support systems for patient/family and Begin healthy grieving over loss  INTERVENTIONS: Interventions utilized: Motivational Interviewing and Supportive Counseling  Standardized Assessments completed: assessed for SI, HI, and self-harm.   ASSESSMENT: Patient currently experiencing depression symptoms that are stemming from unresolved grief. Patient feels she lacks purpose, and is dealing with loneliness.  Patient reported having difficulty falling asleep, and sleeps around five hours a night. Patient does not have her own transportation, which limits her ability to engage in social activities. Patient is not currently having any active SI, however reports that she has fleeting SI at times (no plan or intent).    Patient may benefit from weekly outpatient therapy.  PLAN: 1. Follow up with behavioral health clinician on : one week.  2. Referral(s): [facility]   [place], [place], [place]

## 2018-11-07 ENCOUNTER — Encounter: Payer: Self-pay | Admitting: Licensed Clinical Social Worker

## 2018-11-09 NOTE — Addendum Note (Signed)
Addended by: Yvonna Alanis E on: 11/09/2018 01:06 PM   Modules accepted: Orders

## 2018-11-12 DIAGNOSIS — R69 Illness, unspecified: Secondary | ICD-10-CM | POA: Diagnosis not present

## 2018-11-12 DIAGNOSIS — K006 Disturbances in tooth eruption: Secondary | ICD-10-CM | POA: Diagnosis not present

## 2018-11-19 ENCOUNTER — Ambulatory Visit: Payer: Medicare HMO | Admitting: Licensed Clinical Social Worker

## 2018-11-26 ENCOUNTER — Encounter: Payer: Self-pay | Admitting: Internal Medicine

## 2018-11-26 ENCOUNTER — Ambulatory Visit: Payer: Medicare HMO | Admitting: Licensed Clinical Social Worker

## 2018-12-03 ENCOUNTER — Other Ambulatory Visit: Payer: Self-pay | Admitting: Gastroenterology

## 2018-12-03 DIAGNOSIS — K921 Melena: Secondary | ICD-10-CM | POA: Diagnosis not present

## 2018-12-03 DIAGNOSIS — Z1210 Encounter for screening for malignant neoplasm of intestinal tract, unspecified: Secondary | ICD-10-CM | POA: Diagnosis not present

## 2018-12-03 DIAGNOSIS — K5901 Slow transit constipation: Secondary | ICD-10-CM | POA: Diagnosis not present

## 2018-12-06 ENCOUNTER — Ambulatory Visit (INDEPENDENT_AMBULATORY_CARE_PROVIDER_SITE_OTHER): Payer: Medicare HMO | Admitting: Internal Medicine

## 2018-12-06 ENCOUNTER — Ambulatory Visit (HOSPITAL_COMMUNITY)
Admission: RE | Admit: 2018-12-06 | Discharge: 2018-12-06 | Disposition: A | Discharge status: Home / Self Care | Payer: Medicare HMO | Source: Ambulatory Visit | Attending: Internal Medicine | Admitting: Internal Medicine

## 2018-12-06 ENCOUNTER — Other Ambulatory Visit: Payer: Self-pay | Admitting: Internal Medicine

## 2018-12-06 VITALS — BP 153/64 | HR 82 | Temp 98.6°F | Wt 333.0 lb

## 2018-12-06 DIAGNOSIS — I10 Essential (primary) hypertension: Secondary | ICD-10-CM | POA: Diagnosis not present

## 2018-12-06 DIAGNOSIS — M545 Low back pain, unspecified: Secondary | ICD-10-CM

## 2018-12-06 DIAGNOSIS — M47816 Spondylosis without myelopathy or radiculopathy, lumbar region: Secondary | ICD-10-CM | POA: Diagnosis not present

## 2018-12-06 DIAGNOSIS — Z79899 Other long term (current) drug therapy: Secondary | ICD-10-CM | POA: Diagnosis not present

## 2018-12-06 MED ORDER — AMLODIPINE BESYLATE 5 MG PO TABS: 5.0000 mg | ORAL_TABLET | Freq: Every day | ORAL | 1 refills | Status: DC

## 2018-12-06 MED ORDER — CYCLOBENZAPRINE HCL 5 MG PO TABS: 5.0000 mg | ORAL_TABLET | Freq: Three times a day (TID) | ORAL | 1 refills | Status: DC | PRN

## 2018-12-06 MED ORDER — IBUPROFEN 800 MG PO TABS: 800.0000 mg | ORAL_TABLET | Freq: Three times a day (TID) | ORAL | 0 refills | Status: DC | PRN

## 2018-12-06 NOTE — Assessment & Plan Note (Signed)
Back pain: The patient stated that she was at a gym with her daughter lightly lifting dumbbells over her head and walking with out incident. Later that evening while at home and without further provocation she began to experience severe lower back pain. The pain does not radiate to her legs, she denied a personal history of cancer upon further questioning, she denied fever or chills, she denied loss of bowel or bladder control, she denied weakness, numbness or tingling of the lower extremities. She endorses the history of surgery for herniated disc of the lumbar spine in 2001 but denied pain or issues since. She self treated with codeine, aleve and ibuprofen low dose to no avail.    Plan: Despite the lack of other concerning features and the rather benign nature of the injury given her age (68 higher risk of osteroporosis), the patients weight, her history of surgery to the area and the severity of the pain while at rest, I am inclined to obtain plain films to rule out a large fracture or deformity.  I have recommended conservative management with ibuprofen 800mg  TID Flexeril 5mg  TID Continue daily activities as tolerated and given her strict return precautions.

## 2018-12-06 NOTE — Patient Instructions (Signed)
FOLLOW-UP INSTRUCTIONS When: If your symptoms worsen or fail to improve What to bring: All of your medications  I have added Amlodipine ibuprofen to your medications today.   Today we discussed your severe back pain. Please take the ibuprofen 800mg  three times per day for 10 days. In addition, please obtain the xrays. If your symptoms do not begin to improve in the next several days, please notify us at this office.   As always if your symptoms worsen, fail to improve, or you develop other concerning symptoms, please notify our office or visit the local ER if we are unavailable. Symptoms including severe pain that shoots down your leg, fever, loss of bowel or bladder control, should not be ignored and should encourage you to visit the ED if we are unavailable by phone or the symptoms are severe.  Thank you for your visit to the [facility] today. If you have any questions or concerns please call us at [phone].

## 2018-12-06 NOTE — Progress Notes (Signed)
   CC: back pain  HPI:[name] is a 63 y.o. female who presents for evaluation of back pain. Please see individual problem based A/P for details.  Past Medical History:  Diagnosis Date  . Anxiety   . Asthma   . Depression   . GERD (gastroesophageal reflux disease)   . Hyperlipidemia   . Hypertension   . Morbid obesity (Kayla Roberts)    Review of Systems:  ROS negative except as per HPI.  Physical Exam: Vitals:   12/06/18 1412 12/06/18 1440  BP: (!) 191/109 (!) 153/64  Pulse: 97 82  Temp: 98.6 F (37 C)   TempSrc: Oral   SpO2: 99%   Weight: (!) 333 lb (151 kg)    General: A/O x4, notably expressing pain, not in acute cardiorespiratory distress MSK: Strength 5/5 bilateral lower extremities, severe pain on direct palpation of the lumbar spine in the L2-L5 regions as well as to the surrounding musculature to the left, exam limited due to body habitus, due to pain patient declined lying flat for further evaluation.   Assessment & Plan:   See Encounters Tab for problem based charting.  Patient discussed with Dr. Evette Doffing

## 2018-12-06 NOTE — Assessment & Plan Note (Signed)
  HTN: Patient initial BP was markedly elevated above her baseline at 191/109 likely due to ambulation and severe pain. Repeat after 10 min was recorded at 153/64. However, she remains largely above goal. As per chart review, it appears that Norvasc was recommended in the past her her BP remained out of control to which she agreed today.  Plan:  Start Amlodipine 5mg  daily Continue Lisinopril 20mg  daily Continue chlorthalidone 50mg  daily Return to see PCP for BP check, patient stated that she will call and schedule

## 2018-12-07 NOTE — Progress Notes (Signed)
Internal Medicine Clinic Attending  Case discussed with Dr. Harbrecht at the time of the visit.  We reviewed the resident's history and exam and pertinent patient test results.  I agree with the assessment, diagnosis, and plan of care documented in the resident's note.   

## 2019-01-14 ENCOUNTER — Other Ambulatory Visit: Payer: Self-pay | Admitting: Gastroenterology

## 2019-01-18 ENCOUNTER — Ambulatory Visit (HOSPITAL_COMMUNITY)
Admission: RE | Admit: 2019-01-18 | Discharge: 2019-01-18 | Disposition: A | Discharge status: Home / Self Care | Payer: Medicare HMO | Attending: Gastroenterology | Admitting: Gastroenterology

## 2019-01-18 ENCOUNTER — Ambulatory Visit (HOSPITAL_COMMUNITY): Payer: Medicare HMO | Admitting: Anesthesiology

## 2019-01-18 ENCOUNTER — Encounter (HOSPITAL_COMMUNITY): Payer: Self-pay | Admitting: *Deleted

## 2019-01-18 ENCOUNTER — Encounter (HOSPITAL_COMMUNITY)
Admission: RE | Disposition: A | Discharge status: Home / Self Care | Payer: Self-pay | Source: Home / Self Care | Attending: Gastroenterology

## 2019-01-18 ENCOUNTER — Other Ambulatory Visit: Payer: Self-pay

## 2019-01-18 DIAGNOSIS — Z6841 Body Mass Index (BMI) 40.0 and over, adult: Secondary | ICD-10-CM | POA: Diagnosis not present

## 2019-01-18 DIAGNOSIS — Z79899 Other long term (current) drug therapy: Secondary | ICD-10-CM | POA: Insufficient documentation

## 2019-01-18 DIAGNOSIS — Z1211 Encounter for screening for malignant neoplasm of colon: Secondary | ICD-10-CM | POA: Insufficient documentation

## 2019-01-18 DIAGNOSIS — Z7982 Long term (current) use of aspirin: Secondary | ICD-10-CM | POA: Diagnosis not present

## 2019-01-18 DIAGNOSIS — K5730 Diverticulosis of large intestine without perforation or abscess without bleeding: Secondary | ICD-10-CM | POA: Insufficient documentation

## 2019-01-18 DIAGNOSIS — D125 Benign neoplasm of sigmoid colon: Secondary | ICD-10-CM | POA: Diagnosis not present

## 2019-01-18 DIAGNOSIS — D124 Benign neoplasm of descending colon: Secondary | ICD-10-CM | POA: Insufficient documentation

## 2019-01-18 DIAGNOSIS — I10 Essential (primary) hypertension: Secondary | ICD-10-CM | POA: Diagnosis not present

## 2019-01-18 DIAGNOSIS — K635 Polyp of colon: Secondary | ICD-10-CM | POA: Diagnosis not present

## 2019-01-18 DIAGNOSIS — D126 Benign neoplasm of colon, unspecified: Secondary | ICD-10-CM | POA: Diagnosis not present

## 2019-01-18 DIAGNOSIS — D175 Benign lipomatous neoplasm of intra-abdominal organs: Secondary | ICD-10-CM | POA: Insufficient documentation

## 2019-01-18 HISTORY — PX: POLYPECTOMY: SHX5525

## 2019-01-18 HISTORY — PX: COLONOSCOPY WITH PROPOFOL: SHX5780

## 2019-01-18 SURGERY — COLONOSCOPY WITH PROPOFOL
Anesthesia: Monitor Anesthesia Care

## 2019-01-18 MED ORDER — PROPOFOL 10 MG/ML IV BOLUS
INTRAVENOUS | Status: DC | PRN
  Administered 2019-01-18 (×2): 20 mg via INTRAVENOUS

## 2019-01-18 MED ORDER — DEXAMETHASONE SODIUM PHOSPHATE 10 MG/ML IJ SOLN
INTRAMUSCULAR | Status: DC | PRN
  Administered 2019-01-18: 10 mg via INTRAVENOUS

## 2019-01-18 MED ORDER — PROPOFOL 10 MG/ML IV BOLUS
INTRAVENOUS | Status: AC
  Filled 2019-01-18: qty 60

## 2019-01-18 MED ORDER — SODIUM CHLORIDE 0.9 % IV SOLN: INTRAVENOUS | Status: DC

## 2019-01-18 MED ORDER — LACTATED RINGERS IV SOLN
INTRAVENOUS | Status: DC
  Administered 2019-01-18: 1000 mL via INTRAVENOUS

## 2019-01-18 MED ORDER — PROPOFOL 500 MG/50ML IV EMUL
INTRAVENOUS | Status: DC | PRN
  Administered 2019-01-18: 120 ug/kg/min via INTRAVENOUS

## 2019-01-18 MED ORDER — LIDOCAINE 2% (20 MG/ML) 5 ML SYRINGE
INTRAMUSCULAR | Status: DC | PRN
  Administered 2019-01-18: 100 mg via INTRAVENOUS

## 2019-01-18 MED ORDER — ONDANSETRON HCL 4 MG/2ML IJ SOLN
INTRAMUSCULAR | Status: DC | PRN
  Administered 2019-01-18: 4 mg via INTRAVENOUS

## 2019-01-18 SURGICAL SUPPLY — 22 items

## 2019-01-18 NOTE — Progress Notes (Signed)
Kayla Roberts 11:26 AM  Subjective: Patient without any new complaints since I saw her recently in the office and case discussed with her daughter as well  Objective: Vital signs stable afebrile exam please see preassessment evaluation  Assessment: Colon screening  Plan: Okay to proceed with colonoscopy with anesthesia assistance  [facility]  Pager [phone] After 5PM or if no answer call [phone]

## 2019-01-18 NOTE — Op Note (Signed)
[facility] Patient Name: Kayla Roberts Procedure Date: 01/18/2019 MRN: [id number] Attending MD: Clarene Essex , MD Date of Birth: [birth year] CSN: [id number] Age: 63 Admit Type: Outpatient Procedure:                Colonoscopy Indications:              Screening for colorectal malignant neoplasm, This                            is the patient's first colonoscopy Providers:                Clarene Essex, MD, Zenon Mayo, RN, Charolette Child,                            Technician, Danley Danker, CRNA Referring MD:              Medicines:                Propofol total dose 470 mg IV, 100 milligrams of IV                            lidocaine Complications:            No immediate complications. Estimated Blood Loss:     Estimated blood loss: none. Procedure:                Pre-Anesthesia Assessment:                           - Prior to the procedure, a History and Physical                            was performed, and patient medications and                            allergies were reviewed. The patient's tolerance of                            previous anesthesia was also reviewed. The risks                            and benefits of the procedure and the sedation                            options and risks were discussed with the patient.                            All questions were answered, and informed consent                            was obtained. Prior Anticoagulants: The patient has                            taken aspirin, last dose was 2 days prior to  procedure. ASA Grade Assessment: III - A patient                            with severe systemic disease. After reviewing the                            risks and benefits, the patient was deemed in                            satisfactory condition to undergo the procedure.                           After obtaining informed consent, the colonoscope                            was passed  under direct vision. Throughout the                            procedure, the patient's blood pressure, pulse, and                            oxygen saturations were monitored continuously. The                            CF-HQ190L ([id number] Olympus colonoscope was                            introduced through the anus and advanced to the the                            cecum, identified by appendiceal orifice and                            ileocecal valve. The ileocecal valve, appendiceal                            orifice, and rectum were photographed. The                            colonoscopy was performed without difficulty. The                            patient tolerated the procedure well. The quality                            of the bowel preparation was adequate. abdominal                            pressure was applied Scope In: 11:37:02 AM Scope Out: 12:01:15 PM Scope Withdrawal Time: 0 hours 16 minutes 42 seconds  Total Procedure Duration: 0 hours 24 minutes 13 seconds  Findings:      A few small-mouthed diverticula were found in the sigmoid colon.      Two semi-sessile polyps were found in the mid sigmoid  colon and distal       descending colon. The polyps were small in size. These polyps were       removed with a hot snare. Resection and retrieval were complete.      The ileocecal valve was lipomatous.      There was a small lipoma, in the transverse colon.      The exam was otherwise without abnormality. Impression:               - Diverticulosis in the sigmoid colon.                           - Two small polyps in the mid sigmoid colon and in                            the distal descending colon, removed with a hot                            snare. Resected and retrieved.                           - Lipomatous ileocecal valve.                           - Small lipoma in the transverse colon.                           - The examination was otherwise normal. Moderate  Sedation:      Not Applicable - Patient had care per Anesthesia. Recommendation:           - Patient has a contact number available for                            emergencies. The signs and symptoms of potential                            delayed complications were discussed with the                            patient. Return to normal activities tomorrow.                            Written discharge instructions were provided to the                            patient.                           - Soft diet today.                           - Continue present medications.                           - Await pathology results.                           -  Repeat colonoscopy in 5-10 years for surveillance                            based on pathology results.                           - Return to GI office PRN.                           - Telephone GI clinic for pathology results in 1                            week.                           - Telephone GI clinic if symptomatic PRN. Procedure Code(s):        --- Professional ---                           [phone], Colonoscopy, flexible; with removal of                            tumor(s), polyp(s), or other lesion(s) by snare                            technique Diagnosis Code(s):        --- Professional ---                           Z12.11, Encounter for screening for malignant                            neoplasm of colon                           D12.5, Benign neoplasm of sigmoid colon                           D12.4, Benign neoplasm of descending colon                           K57.30, Diverticulosis of large intestine without                            perforation or abscess without bleeding CPT copyright 2018 [employer]. All rights reserved. The codes documented in this report are preliminary and upon coder review may  be revised to meet current compliance requirements. Clarene Essex, MD 01/18/2019 12:12:32 PM This report  has been signed electronically. Number of Addenda: 0

## 2019-01-18 NOTE — Anesthesia Preprocedure Evaluation (Signed)
Anesthesia Evaluation  Patient identified by MRN, date of birth, ID band Patient awake    Reviewed: Allergy & Precautions, NPO status , Patient's Chart, lab work & pertinent test results  Airway Mallampati: II  TM Distance: >3 FB     Dental   Pulmonary    breath sounds clear to auscultation       Cardiovascular hypertension,  Rhythm:Regular Rate:Normal     Neuro/Psych    GI/Hepatic   Endo/Other    Renal/GU      Musculoskeletal   Abdominal   Peds  Hematology   Anesthesia Other Findings   Reproductive/Obstetrics                             Anesthesia Physical Anesthesia Plan  ASA: III  Anesthesia Plan: MAC   Post-op Pain Management:    Induction: Intravenous  PONV Risk Score and Plan: 2 and Treatment may vary due to age or medical condition, Ondansetron, Dexamethasone and Midazolam  Airway Management Planned: Nasal Cannula and Simple Face Mask  Additional Equipment:   Intra-op Plan:   Post-operative Plan:   Informed Consent: I have reviewed the patients History and Physical, chart, labs and discussed the procedure including the risks, benefits and alternatives for the proposed anesthesia with the patient or authorized representative who has indicated his/her understanding and acceptance.     Dental advisory given  Plan Discussed with: CRNA and Anesthesiologist  Anesthesia Plan Comments:         Anesthesia Quick Evaluation

## 2019-01-18 NOTE — Transfer of Care (Signed)
Immediate Anesthesia Transfer of Care Note  Patient: Kayla Roberts  Procedure(s) Performed: COLONOSCOPY WITH PROPOFOL (N/A )  Patient Location: Endoscopy Unit  Anesthesia Type:MAC  Level of Consciousness: drowsy  Airway & Oxygen Therapy: Patient Spontanous Breathing and Patient connected to face mask oxygen  Post-op Assessment: Report given to RN and Post -op Vital signs reviewed and stable  Post vital signs: Reviewed and stable  Last Vitals:  Vitals Value Taken Time  BP    Temp    Pulse 90 01/18/2019 12:07 PM  Resp    SpO2 94 % 01/18/2019 12:07 PM  Vitals shown include unvalidated device data.  Last Pain:  Vitals:   01/18/19 1012  TempSrc: Oral  PainSc: 0-No pain         Complications: No apparent anesthesia complications

## 2019-01-18 NOTE — Discharge Instructions (Signed)
YOU HAD AN ENDOSCOPIC PROCEDURE TODAY: Refer to the procedure report and other information in the discharge instructions given to you for any specific questions about what was found during the examination. If this information does not answer your questions, please call [facility] office at [phone] to clarify.   YOU SHOULD EXPECT: Some feelings of bloating in the abdomen. Passage of more gas than usual. Walking can help get rid of the air that was put into your GI tract during the procedure and reduce the bloating. If you had a lower endoscopy (such as a colonoscopy or flexible sigmoidoscopy) you may notice spotting of blood in your stool or on the toilet paper. Some abdominal soreness may be present for a day or two, also.  DIET: Your first meal following the procedure should be a light meal and then it is ok to progress to your normal diet. A half-sandwich or bowl of soup is an example of a good first meal. Heavy or fried foods are harder to digest and may make you feel nauseous or bloated. Drink plenty of fluids but you should avoid alcoholic beverages for 24 hours. If you had a esophageal dilation, please see attached instructions for diet.   ACTIVITY: Your care partner should take you home directly after the procedure. You should plan to take it easy, moving slowly for the rest of the day. You can resume normal activity the day after the procedure however YOU SHOULD NOT DRIVE, use power tools, machinery or perform tasks that involve climbing or major physical exertion for 24 hours (because of the sedation medicines used during the test).   SYMPTOMS TO REPORT IMMEDIATELY: A gastroenterologist can be reached at any hour. Please call [phone]  for any of the following symptoms:   Following lower endoscopy (colonoscopy, flexible sigmoidoscopy) Excessive amounts of blood in the stool  Significant tenderness, worsening of abdominal pains  Swelling of the abdomen that is new, acute  Fever of 100  or higher    FOLLOW UP:  If any biopsies were taken you will be contacted by phone or by letter within the next 1-3 weeks. Call [phone]  if you have not heard about the biopsies in 3 weeks.  Please also call with any specific questions about appointments or follow up tests. Call if question or problem otherwise call for biopsy report in 1 week and follow-up as needed if any GI issues in the future and begin eating with soft solids like breakfast food and slowly advance diet

## 2019-01-18 NOTE — Progress Notes (Signed)
Pt had high bp post procedure.  Dr. Nyoka Cowden notified and spoke with pt. Per Dr.Green, ok to d/c pt now and pt is to take her bp medicines when she gets home.

## 2019-01-21 ENCOUNTER — Encounter (HOSPITAL_COMMUNITY): Payer: Self-pay | Admitting: Gastroenterology

## 2019-01-29 ENCOUNTER — Encounter (HOSPITAL_COMMUNITY): Payer: Self-pay | Admitting: Gastroenterology

## 2019-01-29 NOTE — Anesthesia Postprocedure Evaluation (Signed)
Anesthesia Post Note  Patient: Kayla Roberts  Procedure(s) Performed: COLONOSCOPY WITH PROPOFOL (N/A ) POLYPECTOMY     Patient location during evaluation: Endoscopy Anesthesia Type: MAC Level of consciousness: awake Pain management: pain level controlled Vital Signs Assessment: post-procedure vital signs reviewed and stable Respiratory status: spontaneous breathing Cardiovascular status: stable Postop Assessment: no apparent nausea or vomiting Anesthetic complications: no    Last Vitals:  Vitals:   01/18/19 1227 01/18/19 1231  BP: (!) 183/98 (!) 182/89  Pulse: 90 90  Resp: 19 18  Temp:    SpO2: 91% 98%    Last Pain:  Vitals:   01/21/19 1241  TempSrc:   PainSc: 0-No pain                 Aurther Harlin

## 2019-02-04 ENCOUNTER — Other Ambulatory Visit: Payer: Self-pay | Admitting: Internal Medicine

## 2019-02-04 DIAGNOSIS — J4540 Moderate persistent asthma, uncomplicated: Secondary | ICD-10-CM

## 2019-02-04 DIAGNOSIS — I10 Essential (primary) hypertension: Secondary | ICD-10-CM

## 2019-02-06 ENCOUNTER — Other Ambulatory Visit: Payer: Self-pay | Admitting: Internal Medicine

## 2019-02-06 DIAGNOSIS — J4540 Moderate persistent asthma, uncomplicated: Secondary | ICD-10-CM

## 2019-02-06 MED ORDER — CHLORTHALIDONE 50 MG PO TABS: ORAL_TABLET | ORAL | 0 refills | Status: DC

## 2019-02-06 NOTE — Telephone Encounter (Signed)
Declined refill for hydrochlorothiazide. This was previously discontinued and she was changed to chlorthalidone. Sent prescription for chlorthalidone and approved albuterol request.

## 2019-03-13 ENCOUNTER — Telehealth: Payer: Self-pay | Admitting: Licensed Clinical Social Worker

## 2019-03-13 NOTE — Telephone Encounter (Signed)
Patient was contacted but did not answer. No voicemail was available.

## 2019-03-14 ENCOUNTER — Telehealth: Payer: Self-pay | Admitting: *Deleted

## 2019-03-14 NOTE — Telephone Encounter (Signed)
NURSING TRIAGE NOTE FOR RESPIRATORY SYMPTOMS  Do you have a fever? Last night it was 102.2; this morning 99.9. Do you have a cough? Yes, clear mucous. Do you have shortness of breath more than normal? No Do you have chest pain? No Are you able to eat and drink normally? Yes but not as much Have you seen a physician for these symptoms? No  Action - I will send this information to the doctor and she will receive a call back.  Pt stated no one in her household has any of her symptoms. Her symptoms started 2 days ago. Stated she has not been around anyone w/coronavirus to her knowledge.

## 2019-03-14 NOTE — Telephone Encounter (Signed)
Medicine attending: Medical history, presenting problems, and medications, reviewed with resident physician Dr Ina Homes on the day of the patient telephone consultation and I concur with his evaluation and management plan. 63 y/o woman w new onset cough, fever to 102 degrees, no dyspnea. 1 daughter works at [employer], 1 works at [employer]. High risk for COVID-19 infection. Pt advised to report to [facility] for screening per current protocol.

## 2019-03-14 NOTE — Telephone Encounter (Signed)
Called patient to discuss her recent symptoms. She states that she had a fever of 102F last night. She now has a cough. She does not have shortness of breath right now but she does have asthma. 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 One daughter works appear at [employer] and another daughter that works at a nursing home, [employer]. [facility] has recently had a COVID-19 outbreak and therefore the patient has a likely exposure.  Given her comorbidities she has high risk. I have recommended that she go for testing. She will present to [facility] emergency department.

## 2019-03-18 ENCOUNTER — Emergency Department (HOSPITAL_COMMUNITY): Payer: Medicare HMO

## 2019-03-18 ENCOUNTER — Other Ambulatory Visit: Payer: Self-pay

## 2019-03-18 ENCOUNTER — Emergency Department (HOSPITAL_COMMUNITY)
Admission: EM | Admit: 2019-03-18 | Discharge: 2019-03-18 | Disposition: A | Discharge status: Home / Self Care | Payer: Medicare HMO | Attending: Emergency Medicine | Admitting: Emergency Medicine

## 2019-03-18 ENCOUNTER — Encounter (HOSPITAL_COMMUNITY): Payer: Self-pay | Admitting: Emergency Medicine

## 2019-03-18 DIAGNOSIS — J4530 Mild persistent asthma, uncomplicated: Secondary | ICD-10-CM | POA: Diagnosis not present

## 2019-03-18 DIAGNOSIS — Z7982 Long term (current) use of aspirin: Secondary | ICD-10-CM | POA: Diagnosis not present

## 2019-03-18 DIAGNOSIS — I10 Essential (primary) hypertension: Secondary | ICD-10-CM | POA: Insufficient documentation

## 2019-03-18 DIAGNOSIS — J181 Lobar pneumonia, unspecified organism: Secondary | ICD-10-CM | POA: Diagnosis not present

## 2019-03-18 DIAGNOSIS — J189 Pneumonia, unspecified organism: Secondary | ICD-10-CM

## 2019-03-18 DIAGNOSIS — Z7189 Other specified counseling: Secondary | ICD-10-CM | POA: Diagnosis not present

## 2019-03-18 DIAGNOSIS — J4531 Mild persistent asthma with (acute) exacerbation: Secondary | ICD-10-CM | POA: Insufficient documentation

## 2019-03-18 DIAGNOSIS — R0602 Shortness of breath: Secondary | ICD-10-CM | POA: Diagnosis not present

## 2019-03-18 DIAGNOSIS — Z79899 Other long term (current) drug therapy: Secondary | ICD-10-CM | POA: Diagnosis not present

## 2019-03-18 DIAGNOSIS — R05 Cough: Secondary | ICD-10-CM | POA: Diagnosis not present

## 2019-03-18 MED ORDER — PREDNISONE 20 MG PO TABS
60.0000 mg | ORAL_TABLET | Freq: Once | ORAL | Status: AC
  Administered 2019-03-18: 60 mg via ORAL
  Filled 2019-03-18: qty 3

## 2019-03-18 MED ORDER — ALBUTEROL SULFATE HFA 108 (90 BASE) MCG/ACT IN AERS
4.0000 | INHALATION_SPRAY | Freq: Once | RESPIRATORY_TRACT | Status: AC
  Administered 2019-03-18: 4 via RESPIRATORY_TRACT
  Filled 2019-03-18: qty 6.7

## 2019-03-18 MED ORDER — PREDNISONE 10 MG (21) PO TBPK: ORAL_TABLET | Freq: Every day | ORAL | 0 refills | Status: DC

## 2019-03-18 MED ORDER — DOXYCYCLINE HYCLATE 100 MG PO CAPS: 100.0000 mg | ORAL_CAPSULE | Freq: Two times a day (BID) | ORAL | 0 refills | Status: DC

## 2019-03-18 MED ORDER — DOXYCYCLINE HYCLATE 100 MG PO TABS
100.0000 mg | ORAL_TABLET | Freq: Once | ORAL | Status: AC
  Administered 2019-03-18: 100 mg via ORAL
  Filled 2019-03-18: qty 1

## 2019-03-18 MED ORDER — METHYLPREDNISOLONE SODIUM SUCC 125 MG IJ SOLR
125.0000 mg | Freq: Once | INTRAMUSCULAR | Status: DC
  Filled 2019-03-18: qty 2

## 2019-03-18 NOTE — ED Provider Notes (Signed)
[facility] DEPT Provider Note   CSN: [id number] Arrival date & time: 03/18/19  1738    History   Chief Complaint Chief Complaint  Patient presents with   Cough   Shortness of Breath    HPI Kayla Roberts is a 63 y.o. female with a past medical history of hypertension, hyperlipidemia obesity, asthma who presents to ED for 1 week of cough productive of clear mucus, fever with T-max 102, shortness of breath.  She told her PCP about the symptoms told her to present to the ED 4 days ago.  However patient she had felt better so she did not come to the ED.  She woke up this morning with worsening shortness of breath.  Her daughter brought her to Mucinex which she states is helped her symptoms.  She denies any sick contacts with similar symptoms or recent travel 29 minutes COVID-19 but she lives with HER-2 daughters who both work and healthcare settings.  She denies any use of antipyretics prior to arrival.  She has been using her inhaler with improvement in her symptoms.  Denies any chest pain, hemoptysis, leg swelling, vomiting or abdominal pain.     HPI  Past Medical History:  Diagnosis Date   Anxiety    Asthma    Depression    GERD (gastroesophageal reflux disease)    Hyperlipidemia    Hypertension    Morbid obesity [facility])     Patient Active Problem List   Diagnosis Date Noted   Acute midline low back pain without sciatica 12/06/2018   Post-viral cough syndrome 06/26/2018   Chest pain 03/20/2018   Internal hemorrhoid 10/13/2017   Healthcare maintenance 10/17/2016   Morbid obesity ([place]) 09/23/2016   Primary localized osteoarthritis of knee 02/02/2016   ANEMIA NOS 09/17/2007   HYPERCHOLESTEROLEMIA 09/25/2006   Major depression, chronic 09/25/2006   Essential hypertension 09/25/2006   Moderate persistent asthma 09/25/2006   GERD 09/25/2006    Past Surgical History:  Procedure Laterality Date   CERVICAL DISCECTOMY  2002    COLONOSCOPY WITH PROPOFOL N/A 01/18/2019   Procedure: COLONOSCOPY WITH PROPOFOL;  Surgeon: Clarene Essex, MD;  Location: [facility];  Service: Endoscopy;  Laterality: N/A;   POLYPECTOMY  01/18/2019   Procedure: POLYPECTOMY;  Surgeon: Clarene Essex, MD;  Location: [facility] ENDOSCOPY;  Service: Endoscopy;;     OB History   No obstetric history on file.      Home Medications    Prior to Admission medications   Medication Sig Start Date End Date Taking? Authorizing Provider  albuterol (PROVENTIL HFA;VENTOLIN HFA) 108 (90 Base) MCG/ACT inhaler INHALE 2 PUFFS INTO THE LUNGS EVERY 6 HOURS AS NEEDED FOR WHEEZING OR SHORTNESS OF BREATH 02/06/19   Velna Ochs, MD  amLODipine (NORVASC) 5 MG tablet TAKE 1 TABLET(5 MG) BY MOUTH DAILY 12/07/18   Velna Ochs, MD  aspirin 81 MG chewable tablet Chew 81 mg by mouth once.    [provider]  atorvastatin (LIPITOR) 20 MG tablet Take 1 tablet (20 mg total) by mouth daily. 02/19/18 02/19/19  Velna Ochs, MD  Cetirizine HCl 10 MG CAPS Take 1 capsule (10 mg total) by mouth daily. 06/26/18   Isabelle Course, MD  chlorthalidone (HYGROTON) 50 MG tablet TAKE 1 TABLET(50 MG) BY MOUTH DAILY 02/06/19   Velna Ochs, MD  cyclobenzaprine (FLEXERIL) 5 MG tablet Take 1 tablet (5 mg total) by mouth 3 (three) times daily as needed for muscle spasms. 12/06/18   Kathi Ludwig, MD  doxycycline (VIBRAMYCIN)  100 MG capsule Take 1 capsule (100 mg total) by mouth 2 (two) times daily for 7 days. 03/18/19 03/25/19  Cyrene Gharibian, PA-C  guaiFENesin-codeine 100-10 MG/5ML syrup Take 5 mLs by mouth at bedtime as needed for cough. 06/26/18   Oda Kilts, MD  ibuprofen (ADVIL,MOTRIN) 800 MG tablet Take 1 tablet (800 mg total) by mouth every 8 (eight) hours as needed. 12/06/18   Kathi Ludwig, MD  lisinopril (PRINIVIL,ZESTRIL) 20 MG tablet Take 1 tablet (20 mg total) by mouth daily. 10/08/18   Velna Ochs, MD  predniSONE (STERAPRED UNI-PAK 21 TAB) 10 MG  (21) TBPK tablet Take by mouth daily. Take 6 tabs by mouth daily  for 2 days, then 5 tabs for 2 days, then 4 tabs for 2 days, then 3 tabs for 2 days, 2 tabs for 2 days, then 1 tab by mouth daily for 2 days 03/18/19   Delia Heady, PA-C  QVAR REDIHALER 40 MCG/ACT inhaler INHALE 2 PUFFS INTO THE LUNGS TWICE DAILY 06/20/18   Velna Ochs, MD    Family History Family History  Problem Relation Age of Onset   Hypertension Mother    Hyperlipidemia Mother    Heart disease Father    Hyperlipidemia Father    Hypertension Father    Hypertension Brother    Heart disease Brother    Hypertension Daughter    Diabetes Sister     Social History Social History   Tobacco Use   Smoking status: Never Smoker   Smokeless tobacco: Never Used  Substance Use Topics   Alcohol use: No   Drug use: No     Allergies   Patient has no known allergies.   Review of Systems Review of Systems  Constitutional: Negative for appetite change, chills and fever.  HENT: Negative for ear pain, rhinorrhea, sneezing and sore throat.   Eyes: Negative for photophobia and visual disturbance.  Respiratory: Positive for cough, shortness of breath and wheezing. Negative for chest tightness.   Cardiovascular: Negative for chest pain and palpitations.  Gastrointestinal: Negative for abdominal pain, blood in stool, constipation, diarrhea, nausea and vomiting.  Genitourinary: Negative for dysuria, hematuria and urgency.  Musculoskeletal: Negative for myalgias.  Skin: Negative for rash.  Neurological: Negative for dizziness, weakness and light-headedness.     Physical Exam Updated Vital Signs BP 129/69 (BP Location: Left Arm)    Pulse 89    Temp 99.3 F (37.4 C) (Oral)    Resp 18    Ht '5\' 8"'  (1.727 m)    Wt (!) 145.2 kg    SpO2 98%    BMI 48.66 kg/m   Physical Exam Vitals signs and nursing note reviewed.  Constitutional:      General: She is not in acute distress.    Appearance: She is  well-developed.     Comments: Speaking in complete sentences without difficulty.  HENT:     Head: Normocephalic and atraumatic.     Nose: Nose normal.  Eyes:     General: No scleral icterus.       Left eye: No discharge.     Conjunctiva/sclera: Conjunctivae normal.  Neck:     Musculoskeletal: Normal range of motion and neck supple.  Cardiovascular:     Rate and Rhythm: Normal rate and regular rhythm.     Heart sounds: Normal heart sounds. No murmur. No friction rub. No gallop.   Pulmonary:     Effort: Pulmonary effort is normal. No respiratory distress.     Breath sounds: Wheezing (  bilateral lung bases, end expiratory) present.  Abdominal:     General: Bowel sounds are normal. There is no distension.     Palpations: Abdomen is soft.     Tenderness: There is no abdominal tenderness. There is no guarding.  Musculoskeletal: Normal range of motion.  Skin:    General: Skin is warm and dry.     Findings: No rash.  Neurological:     Mental Status: She is alert.     Motor: No abnormal muscle tone.     Coordination: Coordination normal.      ED Treatments / Results  Labs (all labs ordered are listed, but only abnormal results are displayed) Labs Reviewed - No data to display  EKG None  Radiology Dg Chest Portable 1 View  Result Date: 03/18/2019 CLINICAL DATA:  Cough and shortness of breath for the past 5 days. New onset fever today. EXAM: PORTABLE CHEST 1 VIEW COMPARISON:  Chest x-ray dated March 08, 2018. FINDINGS: Stable mild cardiomegaly. Normal mediastinal contours. Normal pulmonary vascularity. New small opacity in the left mid lung. Minimal bibasilar atelectasis. No focal consolidation, pleural effusion, or pneumothorax. Unchanged pleural thickening at the bilateral costophrenic angles. No acute osseous abnormality. IMPRESSION: 1. New small opacity in the left mid lung could reflect developing pneumonia given clinical history. Followup PA and lateral chest X-ray is  recommended in 3-4 weeks following trial of antibiotic therapy to ensure resolution. Electronically Signed   By: Titus Dubin M.D.   On: 03/18/2019 19:18    Procedures Procedures (including critical care time)  Medications Ordered in ED Medications  doxycycline (VIBRA-TABS) tablet 100 mg (has no administration in time range)  albuterol (VENTOLIN HFA) 108 (90 Base) MCG/ACT inhaler 4 puff (4 puffs Inhalation Given 03/18/19 1920)  predniSONE (DELTASONE) tablet 60 mg (60 mg Oral Given 03/18/19 1919)     Initial Impression / Assessment and Plan / ED Course  I have reviewed the triage vital signs and the nursing notes.  Pertinent labs & imaging results that were available during my care of the patient were reviewed by me and considered in my medical decision making (see chart for details).        Kayla Roberts was evaluated in Emergency Department on 03/18/19  for the symptoms described in the history of present illness. He/she was evaluated in the context of the global COVID-19 pandemic, which necessitated consideration that the patient might be at risk for infection with the SARS-CoV-2 virus that causes COVID-19. Institutional protocols and algorithms that pertain to the evaluation of patients at risk for COVID-19 are in a state of rapid change based on information released by regulatory bodies including the CDC and federal and state organizations. These policies and algorithms were followed during the patient's care in the ED.   63 year old female with a past medical history of asthma, presents to T-max 1 or 2 with clear mucus, shortness of breath and wheezing.  Advised by hospitalist days ago to present to the ED that she is.  She has had some improvement with her albuterol next.  No sick contacts with similar symptoms.  Wheezing noted on bilateral lower lung fields on my exam.  However, she is not tachypnea, tachycardic or hypoxic.  She is speaking in complete sentences without  difficulty.  Chest x-ray shows new left sided pneumonia.  Patient was given prednisone, albuterol inhaler here with improvement in her symptoms.  Will give instructions for self isolation and treat for asthma exacerbation and PNA.  Patient is  agreeable to the plan.  We will have her return to ED for any severe worsening symptoms.  Patient is hemodynamically stable, in NAD, and able to ambulate in the ED. Evaluation does not show pathology that would require ongoing emergent intervention or inpatient treatment. I explained the diagnosis to the patient. Pain has been managed and has no complaints prior to discharge. Patient is comfortable with above plan and is stable for discharge at this time. All questions were answered prior to disposition. Strict return precautions for returning to the ED were discussed. Encouraged follow up with PCP.   An After Visit Summary was printed and given to the patient.   Portions of this note were generated with [employer]. Dictation errors may occur despite best attempts at proofreading.   Final Clinical Impressions(s) / ED Diagnoses   Final diagnoses:  Community acquired pneumonia of left lung, unspecified part of lung  Educated About Covid-19 Virus Infection  Mild persistent asthma with exacerbation    ED Discharge Orders         Ordered    doxycycline (VIBRAMYCIN) 100 MG capsule  2 times daily     03/18/19 2001    predniSONE (STERAPRED UNI-PAK 21 TAB) 10 MG (21) TBPK tablet  Daily     03/18/19 2001           Delia Heady, Hershal Coria 03/18/19 Ladell Heads, MD 03/18/19 2251

## 2019-03-18 NOTE — Discharge Instructions (Signed)
Person Under Monitoring Name: Kayla Roberts  Location: [place] [address]   Infection Prevention Recommendations for Individuals Confirmed to have, or Being Evaluated for, 2019 Novel Coronavirus (COVID-19) Infection Who Receive Care at Home  Individuals who are confirmed to have, or are being evaluated for, COVID-19 should follow the prevention steps below until a healthcare provider or local or state health department says they can return to normal activities.  Stay home except to get medical care You should restrict activities outside your home, except for getting medical care. Do not go to work, school, or public areas, and do not use public transportation or taxis.  Call ahead before visiting your doctor Before your medical appointment, call the healthcare provider and tell them that you have, or are being evaluated for, COVID-19 infection. This will help the healthcare providers office take steps to keep other people from getting infected. Ask your healthcare provider to call the local or state health department.  Monitor your symptoms Seek prompt medical attention if your illness is worsening (e.g., difficulty breathing). Before going to your medical appointment, call the healthcare provider and tell them that you have, or are being evaluated for, COVID-19 infection. Ask your healthcare provider to call the local or state health department.  Wear a facemask You should wear a facemask that covers your nose and mouth when you are in the same room with other people and when you visit a healthcare provider. People who live with or visit you should also wear a facemask while they are in the same room with you.  Separate yourself from other people in your home As much as possible, you should stay in a different room from other people in your home. Also, you should use a separate bathroom, if available.  Avoid sharing household items You should not  share dishes, drinking glasses, cups, eating utensils, towels, bedding, or other items with other people in your home. After using these items, you should wash them thoroughly with soap and water.  Cover your coughs and sneezes Cover your mouth and nose with a tissue when you cough or sneeze, or you can cough or sneeze into your sleeve. Throw used tissues in a lined trash can, and immediately wash your hands with soap and water for at least 20 seconds or use an alcohol-based hand rub.  Wash your [facility] your hands often and thoroughly with soap and water for at least 20 seconds. You can use an alcohol-based hand sanitizer if soap and water are not available and if your hands are not visibly dirty. Avoid touching your eyes, nose, and mouth with unwashed hands.   Prevention Steps for Caregivers and Household Members of Individuals Confirmed to have, or Being Evaluated for, COVID-19 Infection Being Cared for in the Home  If you live with, or provide care at home for, a person confirmed to have, or being evaluated for, COVID-19 infection please follow these guidelines to prevent infection:  Follow healthcare providers instructions Make sure that you understand and can help the patient follow any healthcare provider instructions for all care.  Provide for the patients basic needs You should help the patient with basic needs in the home and provide support for getting groceries, prescriptions, and other personal needs.  Monitor the patients symptoms If they are getting sicker, call his or her medical provider and tell them that the patient has, or is being evaluated for, COVID-19 infection. This will help the healthcare providers office take  steps to keep other people from getting infected. Ask the healthcare provider to call the local or state health department.  Limit the number of people who have contact with the patient If possible, have only one caregiver for the  patient. Other household members should stay in another home or place of residence. If this is not possible, they should stay in another room, or be separated from the patient as much as possible. Use a separate bathroom, if available. Restrict visitors who do not have an essential need to be in the home.  Keep older adults, very young children, and other sick people away from the patient Keep older adults, very young children, and those who have compromised immune systems or chronic health conditions away from the patient. This includes people with chronic heart, lung, or kidney conditions, diabetes, and cancer.  Ensure good ventilation Make sure that shared spaces in the home have good air flow, such as from an air conditioner or an opened window, weather permitting.  Wash your hands often Wash your hands often and thoroughly with soap and water for at least 20 seconds. You can use an alcohol based hand sanitizer if soap and water are not available and if your hands are not visibly dirty. Avoid touching your eyes, nose, and mouth with unwashed hands. Use disposable paper towels to dry your hands. If not available, use dedicated cloth towels and replace them when they become wet.  Wear a facemask and gloves Wear a disposable facemask at all times in the room and gloves when you touch or have contact with the patients blood, body fluids, and/or secretions or excretions, such as sweat, saliva, sputum, nasal mucus, vomit, urine, or feces.  Ensure the mask fits over your nose and mouth tightly, and do not touch it during use. Throw out disposable facemasks and gloves after using them. Do not reuse. Wash your hands immediately after removing your facemask and gloves. If your personal clothing becomes contaminated, carefully remove clothing and launder. Wash your hands after handling contaminated clothing. Place all used disposable facemasks, gloves, and other waste in a lined container before  disposing them with other household waste. Remove gloves and wash your hands immediately after handling these items.  Do not share dishes, glasses, or other household items with the patient Avoid sharing household items. You should not share dishes, drinking glasses, cups, eating utensils, towels, bedding, or other items with a patient who is confirmed to have, or being evaluated for, COVID-19 infection. After the person uses these items, you should wash them thoroughly with soap and water.  Wash laundry thoroughly Immediately remove and wash clothes or bedding that have blood, body fluids, and/or secretions or excretions, such as sweat, saliva, sputum, nasal mucus, vomit, urine, or feces, on them. Wear gloves when handling laundry from the patient. Read and follow directions on labels of laundry or clothing items and detergent. In general, wash and dry with the warmest temperatures recommended on the label.  Clean all areas the individual has used often Clean all touchable surfaces, such as counters, tabletops, doorknobs, bathroom fixtures, toilets, phones, keyboards, tablets, and bedside tables, every day. Also, clean any surfaces that may have blood, body fluids, and/or secretions or excretions on them. Wear gloves when cleaning surfaces the patient has come in contact with. Use a diluted bleach solution (e.g., dilute bleach with 1 part bleach and 10 parts water) or a household disinfectant with a label that says EPA-registered for coronaviruses. To make a bleach solution  at home, add 1 tablespoon of bleach to 1 quart (4 cups) of water. For a larger supply, add  cup of bleach to 1 gallon (16 cups) of water. Read labels of cleaning products and follow recommendations provided on product labels. Labels contain instructions for safe and effective use of the cleaning product including precautions you should take when applying the product, such as wearing gloves or eye protection and making sure you  have good ventilation during use of the product. Remove gloves and wash hands immediately after cleaning.  Monitor yourself for signs and symptoms of illness Caregivers and household members are considered close contacts, should monitor their health, and will be asked to limit movement outside of the home to the extent possible. Follow the monitoring steps for close contacts listed on the symptom monitoring form.   ? If you have additional questions, contact your local health department or call the epidemiologist on call at [phone] (available 24/7). ? This guidance is subject to change. For the most up-to-date guidance from [facility], please refer to their website: [employer]pl

## 2019-03-18 NOTE — ED Triage Notes (Signed)
Pt states that last week she was feeling bad, having fevers, cough with clear phlegm and SOB. Her PCP advised to go to ED but pt didn't.  Reports this morning woke up around 5am with SOb. Hx asthma. Denies travel but has daughters who work in [employer].

## 2019-03-19 ENCOUNTER — Telehealth: Payer: Self-pay | Admitting: *Deleted

## 2019-03-19 NOTE — Telephone Encounter (Signed)
Call from pt - wanting to get tested for coronavirus ; stated she went to [facility]; told she has pneumonia and medications were prescribed. She was instructed to call her doctor's office. Stated she does not want to get her children sick if she does have the virus. Pt crying/upset.     NURSING TRIAGE NOTE FOR RESPIRATORY SYMPTOMS  Do you have a fever? 99.9.  Do you have a cough? Yes - productive Do you have shortness of breath more than normal? Yes - mild OR with significant movement Do you have chest pain?No, not like yesterday. Are you able to eat and drink normally? Yes Have you seen a physician for these symptoms? Yes stated she went to [facility] ED.  Action - no open appts today. Pt instructed to take the meds prescribed and if SOB/ fever/cough worsen to go to [facility] ED. Also [facility] tele health visit scheduled on Thurs to f/u. Pt verbalized understanding.  I informed patient to expect a phone call from a physician soon and I sent request to front desk to schedule a virtual office appt for patient and arrive the patient.

## 2019-03-19 NOTE — Telephone Encounter (Signed)
Medicine attending: We spoke to this pt a few days ago and advised her to go to [facility] ED for eval. She did not go until 4 d later. ED record reviewed: T 99.3, Resp 18, O2 sat 98% on RA, pulse 89. Bibasilar wheezes. No acute Resp distress. I reviewed CXR: small, localized over about 2 cm LLL patchy infiltrate. Possible early pneumonia. She was not considered to be high risk for COVID infection so was not tested but advised to return to the ED for worsening sxs. Now w increasing anxiety and wants to be tested. No progressive sxs. We are not doing testing in outpatient setting. Already evaluated & felt to be low risk. We are happy to set up a tele-health visit. In interim, continue Rx initiated through ED.

## 2019-03-21 ENCOUNTER — Ambulatory Visit (INDEPENDENT_AMBULATORY_CARE_PROVIDER_SITE_OTHER): Payer: Medicare HMO | Admitting: Internal Medicine

## 2019-03-21 ENCOUNTER — Other Ambulatory Visit: Payer: Self-pay

## 2019-03-21 DIAGNOSIS — Z792 Long term (current) use of antibiotics: Secondary | ICD-10-CM | POA: Diagnosis not present

## 2019-03-21 DIAGNOSIS — J189 Pneumonia, unspecified organism: Secondary | ICD-10-CM

## 2019-03-21 DIAGNOSIS — Z79899 Other long term (current) drug therapy: Secondary | ICD-10-CM | POA: Diagnosis not present

## 2019-03-21 DIAGNOSIS — Z7952 Long term (current) use of systemic steroids: Secondary | ICD-10-CM

## 2019-03-21 DIAGNOSIS — R197 Diarrhea, unspecified: Secondary | ICD-10-CM | POA: Diagnosis not present

## 2019-03-21 MED ORDER — GUAIFENESIN-CODEINE 100-10 MG/5ML PO SOLN: 5.0000 mL | Freq: Every evening | ORAL | 0 refills | Status: DC | PRN

## 2019-03-21 NOTE — Assessment & Plan Note (Signed)
   [facility] follow up: fever has resolved still having body aches and cough productive of mucous, she is having some diarrhea.  She is taking the doxy and prednisone.  She is using a flow meter peak flow is about 250 she has never used this before.  She does still feel short of breath but has improved.  Still having some wheezing.  She does not have a pulse oximeter.  Using albuterol 3x per day.  Drinking well, sense of taste diminished so not eating as much.  Overall she is improving.  The main reason she went to [place] was to get tested because she has some family staying with her at the moment and doesn't want to get them sick.  Overall reassuring speaking in full sentences on the phone does not sound short of breath, only using albuterol x3 per day.    P: continue course of treatment from [facility] ED  cough syrup to help relieve night time cough and pain from coughing  As always, pt is advised that if symptoms worsen or new symptoms arise, they should go to an urgent care facility or to to ER for further evaluation.

## 2019-03-21 NOTE — Progress Notes (Addendum)
   [facility] Telephone Encounter  Reason for call:   This telephone encounter was created for Ms. Kayla Roberts on 03/21/2019 for the following purpose/cc CAP follow up.   Pertinent Data:   Recent visit to [facility] evaluated felt to be low risk covid treated for CAP/Asthma ROS: Pulmonary: pt still with SOB but improved  Cardiac: pt denies palpitations, chest pain,   Abdominal: pt denies abdominal pain, nausea, vomiting, endorses some mild diarrhea after starting abx   Assessment / Plan / Recommendations:   CAP follow up: fever has resolved still having body aches and cough productive of mucous, she is having some diarrhea.  She is taking the doxy and prednisone.  She is using a flow meter peak flow is about 250 she has never used this before.  She does still feel short of breath but has improved.  Still having some wheezing.  She does not have a pulse oximeter.  Using albuterol 3x per day.  Drinking well, sense of taste diminished so not eating as much.  Overall she is improving.  The main reason she went to [place] was to get tested because she has some family staying with her at the moment and doesn't want to get them sick.  Overall reassuring speaking in full sentences on the phone does not sound short of breath, only using albuterol x3 per day.    P: continue course of treatment from [facility] 2 ED  cough syrup to help relieve night time cough and pain from coughing  As always, pt is advised that if symptoms worsen or new symptoms arise, they should go to an urgent care facility or to to ER for further evaluation.   Consent and Medical Decision Making:   Patient discussed with Dr. Dareen Piano  This is a telephone encounter between Kayla Roberts and Vickki Muff on 03/21/2019 for CAP follow up. The visit was conducted with the patient located at home and Vickki Muff at [facility]. The patient's identity was confirmed using their DOB and current address. The patient  has consented to being evaluated through a telephone encounter and understands the associated risks (an examination cannot be done and the patient may need to come in for an appointment) / benefits (allows the patient to remain at home, decreasing exposure to coronavirus). I personally spent 14 minutes on medical discussion.

## 2019-03-22 NOTE — Progress Notes (Signed)
Internal Medicine Clinic Attending  Case discussed with Dr. Winfrey  at the time of the visit.  We reviewed the resident's history and exam and pertinent patient test results.  I agree with the assessment, diagnosis, and plan of care documented in the resident's note.  

## 2019-03-23 ENCOUNTER — Other Ambulatory Visit: Payer: Self-pay

## 2019-03-23 ENCOUNTER — Inpatient Hospital Stay (HOSPITAL_COMMUNITY)
Admission: EM | Admit: 2019-03-23 | Discharge: 2019-04-03 | DRG: 177 | Disposition: A | Discharge status: Home Health | Payer: Medicare HMO | Attending: Internal Medicine | Admitting: Internal Medicine

## 2019-03-23 ENCOUNTER — Emergency Department (HOSPITAL_COMMUNITY): Payer: Medicare HMO

## 2019-03-23 ENCOUNTER — Encounter (HOSPITAL_COMMUNITY): Payer: Self-pay

## 2019-03-23 DIAGNOSIS — E785 Hyperlipidemia, unspecified: Secondary | ICD-10-CM | POA: Diagnosis present

## 2019-03-23 DIAGNOSIS — J189 Pneumonia, unspecified organism: Secondary | ICD-10-CM

## 2019-03-23 DIAGNOSIS — Z8349 Family history of other endocrine, nutritional and metabolic diseases: Secondary | ICD-10-CM | POA: Diagnosis not present

## 2019-03-23 DIAGNOSIS — E876 Hypokalemia: Secondary | ICD-10-CM | POA: Diagnosis not present

## 2019-03-23 DIAGNOSIS — Z7951 Long term (current) use of inhaled steroids: Secondary | ICD-10-CM | POA: Diagnosis not present

## 2019-03-23 DIAGNOSIS — I480 Paroxysmal atrial fibrillation: Secondary | ICD-10-CM | POA: Diagnosis not present

## 2019-03-23 DIAGNOSIS — R52 Pain, unspecified: Secondary | ICD-10-CM | POA: Diagnosis not present

## 2019-03-23 DIAGNOSIS — D649 Anemia, unspecified: Secondary | ICD-10-CM | POA: Diagnosis not present

## 2019-03-23 DIAGNOSIS — R000 Tachycardia, unspecified: Secondary | ICD-10-CM | POA: Diagnosis not present

## 2019-03-23 DIAGNOSIS — R0602 Shortness of breath: Secondary | ICD-10-CM | POA: Diagnosis not present

## 2019-03-23 DIAGNOSIS — Z79899 Other long term (current) drug therapy: Secondary | ICD-10-CM | POA: Diagnosis not present

## 2019-03-23 DIAGNOSIS — R739 Hyperglycemia, unspecified: Secondary | ICD-10-CM

## 2019-03-23 DIAGNOSIS — I10 Essential (primary) hypertension: Secondary | ICD-10-CM | POA: Diagnosis present

## 2019-03-23 DIAGNOSIS — K921 Melena: Secondary | ICD-10-CM | POA: Diagnosis not present

## 2019-03-23 DIAGNOSIS — E119 Type 2 diabetes mellitus without complications: Secondary | ICD-10-CM

## 2019-03-23 DIAGNOSIS — R0902 Hypoxemia: Secondary | ICD-10-CM

## 2019-03-23 DIAGNOSIS — I4819 Other persistent atrial fibrillation: Secondary | ICD-10-CM | POA: Diagnosis not present

## 2019-03-23 DIAGNOSIS — J1282 Pneumonia due to coronavirus disease 2019: Secondary | ICD-10-CM

## 2019-03-23 DIAGNOSIS — E1165 Type 2 diabetes mellitus with hyperglycemia: Secondary | ICD-10-CM | POA: Diagnosis present

## 2019-03-23 DIAGNOSIS — Z6841 Body Mass Index (BMI) 40.0 and over, adult: Secondary | ICD-10-CM

## 2019-03-23 DIAGNOSIS — J4540 Moderate persistent asthma, uncomplicated: Secondary | ICD-10-CM | POA: Diagnosis present

## 2019-03-23 DIAGNOSIS — J1289 Other viral pneumonia: Secondary | ICD-10-CM | POA: Diagnosis not present

## 2019-03-23 DIAGNOSIS — Z8249 Family history of ischemic heart disease and other diseases of the circulatory system: Secondary | ICD-10-CM | POA: Diagnosis not present

## 2019-03-23 DIAGNOSIS — Z7982 Long term (current) use of aspirin: Secondary | ICD-10-CM | POA: Diagnosis not present

## 2019-03-23 DIAGNOSIS — R079 Chest pain, unspecified: Secondary | ICD-10-CM | POA: Diagnosis not present

## 2019-03-23 DIAGNOSIS — Z833 Family history of diabetes mellitus: Secondary | ICD-10-CM

## 2019-03-23 DIAGNOSIS — J9621 Acute and chronic respiratory failure with hypoxia: Secondary | ICD-10-CM | POA: Diagnosis present

## 2019-03-23 DIAGNOSIS — T380X5A Adverse effect of glucocorticoids and synthetic analogues, initial encounter: Secondary | ICD-10-CM | POA: Diagnosis present

## 2019-03-23 DIAGNOSIS — R05 Cough: Secondary | ICD-10-CM | POA: Diagnosis not present

## 2019-03-23 DIAGNOSIS — I4891 Unspecified atrial fibrillation: Secondary | ICD-10-CM | POA: Diagnosis not present

## 2019-03-23 DIAGNOSIS — R062 Wheezing: Secondary | ICD-10-CM | POA: Diagnosis not present

## 2019-03-23 LAB — CBC WITH DIFFERENTIAL/PLATELET
Abs Immature Granulocytes: 0.04 10*3/uL (ref 0.00–0.07)
Basophils Absolute: 0 10*3/uL (ref 0.0–0.1)
Basophils Relative: 0 %
Eosinophils Absolute: 0 10*3/uL (ref 0.0–0.5)
Eosinophils Relative: 0 %
HCT: 31.9 % — ABNORMAL LOW (ref 36.0–46.0)
Hemoglobin: 10.6 g/dL — ABNORMAL LOW (ref 12.0–15.0)
Immature Granulocytes: 0 %
Lymphocytes Relative: 7 %
Lymphs Abs: 0.7 10*3/uL (ref 0.7–4.0)
MCH: 27.5 pg (ref 26.0–34.0)
MCHC: 33.2 g/dL (ref 30.0–36.0)
MCV: 82.9 fL (ref 80.0–100.0)
Monocytes Absolute: 0.4 10*3/uL (ref 0.1–1.0)
Monocytes Relative: 4 %
Neutro Abs: 8.1 10*3/uL — ABNORMAL HIGH (ref 1.7–7.7)
Neutrophils Relative %: 89 %
Platelets: 263 10*3/uL (ref 150–400)
RBC: 3.85 MIL/uL — ABNORMAL LOW (ref 3.87–5.11)
RDW: 13.6 % (ref 11.5–15.5)
WBC: 9.2 10*3/uL (ref 4.0–10.5)
nRBC: 0 % (ref 0.0–0.2)

## 2019-03-23 LAB — BRAIN NATRIURETIC PEPTIDE: B Natriuretic Peptide: 38.2 pg/mL (ref 0.0–100.0)

## 2019-03-23 LAB — FERRITIN: Ferritin: 128 ng/mL (ref 11–307)

## 2019-03-23 LAB — COMPREHENSIVE METABOLIC PANEL
ALT: 23 U/L (ref 0–44)
AST: 26 U/L (ref 15–41)
Albumin: 3.5 g/dL (ref 3.5–5.0)
Alkaline Phosphatase: 104 U/L (ref 38–126)
Anion gap: 10 (ref 5–15)
BUN: 24 mg/dL — ABNORMAL HIGH (ref 8–23)
CO2: 29 mmol/L (ref 22–32)
Calcium: 8.9 mg/dL (ref 8.9–10.3)
Chloride: 97 mmol/L — ABNORMAL LOW (ref 98–111)
Creatinine, Ser: 0.82 mg/dL (ref 0.44–1.00)
GFR calc Af Amer: >60 mL/min (ref >60)
GFR calc non Af Amer: >60 mL/min (ref >60)
Glucose, Bld: 138 mg/dL — ABNORMAL HIGH (ref 70–99)
Potassium: 3.2 mmol/L — ABNORMAL LOW (ref 3.5–5.1)
Sodium: 136 mmol/L (ref 135–145)
Total Bilirubin: 0.7 mg/dL (ref 0.3–1.2)
Total Protein: 7.5 g/dL (ref 6.5–8.1)

## 2019-03-23 LAB — TROPONIN I: Troponin I: <0.03 ng/mL (ref <0.03)

## 2019-03-23 LAB — LACTATE DEHYDROGENASE: LDH: 230 U/L — ABNORMAL HIGH (ref 98–192)

## 2019-03-23 LAB — D-DIMER, QUANTITATIVE: D-Dimer, Quant: 1.58 ug/mL-FEU — ABNORMAL HIGH (ref 0.00–0.50)

## 2019-03-23 LAB — ABO/RH: ABO/RH(D): B POS

## 2019-03-23 LAB — SARS CORONAVIRUS 2 BY RT PCR (HOSPITAL ORDER, PERFORMED IN ~~LOC~~ HOSPITAL LAB): SARS Coronavirus 2: POSITIVE — AB

## 2019-03-23 LAB — C-REACTIVE PROTEIN: CRP: 14.7 mg/dL — ABNORMAL HIGH (ref <1.0)

## 2019-03-23 MED ORDER — HYDROCOD POLST-CPM POLST ER 10-8 MG/5ML PO SUER: 5.0000 mL | Freq: Two times a day (BID) | ORAL | Status: DC | PRN

## 2019-03-23 MED ORDER — ONDANSETRON HCL 4 MG/2ML IJ SOLN: 4.0000 mg | Freq: Four times a day (QID) | INTRAMUSCULAR | Status: DC | PRN

## 2019-03-23 MED ORDER — ENOXAPARIN SODIUM 40 MG/0.4ML ~~LOC~~ SOLN: 40.0000 mg | SUBCUTANEOUS | Status: DC

## 2019-03-23 MED ORDER — ONDANSETRON HCL 4 MG PO TABS: 4.0000 mg | ORAL_TABLET | Freq: Four times a day (QID) | ORAL | Status: DC | PRN

## 2019-03-23 MED ORDER — VITAMIN C 500 MG PO TABS
500.0000 mg | ORAL_TABLET | Freq: Every day | ORAL | Status: DC
  Administered 2019-03-24 – 2019-04-03 (×11): 500 mg via ORAL
  Filled 2019-03-23 (×12): qty 1

## 2019-03-23 MED ORDER — FLEET ENEMA 7-19 GM/118ML RE ENEM: 1.0000 | ENEMA | Freq: Once | RECTAL | Status: DC | PRN

## 2019-03-23 MED ORDER — ACETAMINOPHEN 650 MG RE SUPP: 650.0000 mg | Freq: Four times a day (QID) | RECTAL | Status: DC | PRN

## 2019-03-23 MED ORDER — ACETAMINOPHEN 325 MG PO TABS
650.0000 mg | ORAL_TABLET | Freq: Four times a day (QID) | ORAL | Status: DC | PRN
  Administered 2019-03-23 – 2019-04-03 (×3): 650 mg via ORAL
  Filled 2019-03-23 (×3): qty 2

## 2019-03-23 MED ORDER — SORBITOL 70 % SOLN: 30.0000 mL | Freq: Every day | Status: DC | PRN

## 2019-03-23 MED ORDER — ZINC SULFATE 220 (50 ZN) MG PO CAPS
220.0000 mg | ORAL_CAPSULE | Freq: Every day | ORAL | Status: DC
  Administered 2019-03-24 – 2019-04-03 (×11): 220 mg via ORAL
  Filled 2019-03-23 (×12): qty 1

## 2019-03-23 MED ORDER — SENNA 8.6 MG PO TABS
1.0000 | ORAL_TABLET | Freq: Two times a day (BID) | ORAL | Status: DC
  Administered 2019-03-23 – 2019-04-03 (×21): 8.6 mg via ORAL
  Filled 2019-03-23 (×22): qty 1

## 2019-03-23 MED ORDER — GUAIFENESIN-DM 100-10 MG/5ML PO SYRP: 10.0000 mL | ORAL_SOLUTION | ORAL | Status: DC | PRN

## 2019-03-23 MED ORDER — MAGNESIUM HYDROXIDE 400 MG/5ML PO SUSP: 30.0000 mL | Freq: Every day | ORAL | Status: DC | PRN

## 2019-03-23 NOTE — H&P (Addendum)
[facility] History and Physical  Kayla Roberts YKD:[id number] DOB: 10-01-1956 DOA: 03/23/2019 Referring physician: ED PCP: Velna Ochs, MD  Chief Complaint: Fever, shortness of breath ------------------------------------------------------------------------------------------------------ Assessment/Plan: Active Problems:   Pneumonia due to COVID-19 virus  COVID-19 pneumonia Date: @TODAY @ 4:29 PM  Patient Isolation: Droplet/Contact HCP PPE: Wearing all recommended PPE N95 mask + eye protection + Gown + Gloves + Surgical Cap Patient PPE: facemask - Currently low risk on 2 L oxygen via nasal cannula at this time.  - Chest x-ray showed worsening bilateral multilobar pneumonia, concerning for potential viral infection. - LDH, Ferritin, d-dimer, procalcitonin, Troponin, CRP levels sent.  Ordered for next 5 days as well. - Monitor in COVID unit.  - Droplet/contact isolation - Keep prone for 16 hours a day. - We will continue to monitor respiratory symptoms.  History of hypertension -Prior to admission, patient was on amlodipine, HCTZ, lisinopril -Blood pressure elevated to 170s-180s.  Will start on IV hydralazine PRN.  History of hyperlipidemia -Prior to admission, patient was on aspirin and statin  Morbid obesity - Body mass index is 48.67 kg/m. Patient has been advised to make an attempt to improve diet and exercise patterns to aid in weight loss.  Diet: Cardiac DVT prophylaxis:  Lovenox Code Status:  Full code Family Communication:  I called and updated patient's daughter Dabah.  She is a Architect at [employer].  She states that she is off work for 5 days since Monday.  States that she will call and notify her supervisor about her having constant exposure to her mother. Disposition Plan:  Transfer to [facility] today.  If improved, anticipate discharge to home in next 2 to 3  days.  ----------------------------------------------------------------------------------------------------- History of Present Illness: Kayla Roberts is a 63 y.o. female with past medical history of hypertension, hyperlipidemia, morbid obesity, GERD, depression, anxiety, asthma. Patient presented from home with complaint of worsening shortness of breath, fever, cough and lethargy for last 1 week. Patient lives with her 2 daughters.  One daughter apparently works at [employer] where there was an outbreak of [place].  Other daughter works at lab at [employer].  None of the daughters are sick or were recently sick.  Patient also states that she has not been out of her house and has no other potential exposure. On 4/16, patient had a virtual encounter with her primary care provider for a fever of 102.2 and cough.  She was recommended to come to ED for COVID-19 testing. She showed up at was long ED 4 days later on 4/20 with worsening symptoms.  Per ED attending at that time patient did not qualify to be tested.  She was discharged to home with doxycycline and prednisone.  Patient states he finished the course today. Despite completion of course, patient continues to feel lethargic, she is starting to cough up phlegm.  She was spiking fever up to 102 last night.  Today particularly her symptoms are worse.  EMS was called and she was brought to the ED.  ED Course: In the ED, patient patient had a temperature of nine 9.4, heart rate is in the 90s mostly, breathing in the low 20s.  Oxygen saturation 90% on 2 L oxygen via nasal cannula.  Blood pressure 189/95. Labs showed potassium low at 3.2, BUN/creatinine 24/0.82, first troponin negative, WBC count normal at 9.2, hemoglobin 10.6, platelet 263. COVID-19 test positive. Chest x-ray showed worsening bilateral multilobar pneumonia, concerning for potential viral infection.  Review of Systems:  All systems were reviewed and were negative unless  otherwise mentioned in the HPI   Past medical history: Past Medical History:  Diagnosis Date   Anxiety    Asthma    Depression    GERD (gastroesophageal reflux disease)    Hyperlipidemia    Hypertension    Morbid obesity ([place])     Past surgical history: Past Surgical History:  Procedure Laterality Date   CERVICAL DISCECTOMY  2002   COLONOSCOPY WITH PROPOFOL N/A 01/18/2019   Procedure: COLONOSCOPY WITH PROPOFOL;  Surgeon: Clarene Essex, MD;  Location: [facility];  Service: Endoscopy;  Laterality: N/A;   POLYPECTOMY  01/18/2019   Procedure: POLYPECTOMY;  Surgeon: Clarene Essex, MD;  Location: [facility];  Service: Endoscopy;;    Social History:  reports that she has never smoked. She has never used smokeless tobacco. She reports that she does not drink alcohol or use drugs.  Allergies:  No Known Allergies  Family history:  Family History  Problem Relation Age of Onset   Hypertension Mother    Hyperlipidemia Mother    Heart disease Father    Hyperlipidemia Father    Hypertension Father    Hypertension Brother    Heart disease Brother    Hypertension Daughter    Diabetes Sister      Home Meds: Prior to Admission medications   Medication Sig Start Date End Date Taking? Authorizing Provider  albuterol (PROVENTIL HFA;VENTOLIN HFA) 108 (90 Base) MCG/ACT inhaler INHALE 2 PUFFS INTO THE LUNGS EVERY 6 HOURS AS NEEDED FOR WHEEZING OR SHORTNESS OF BREATH Patient taking differently: Inhale 2 puffs into the lungs every 6 (six) hours as needed for wheezing or shortness of breath.  02/06/19  Yes Velna Ochs, MD  amLODipine (NORVASC) 5 MG tablet TAKE 1 TABLET(5 MG) BY MOUTH DAILY Patient taking differently: Take 5 mg by mouth daily.  12/07/18  Yes Velna Ochs, MD  aspirin 81 MG tablet Take 81 mg by mouth daily.    Yes [provider]  atorvastatin (LIPITOR) 20 MG tablet Take 1 tablet (20 mg total) by mouth daily. 02/19/18 03/23/19 Yes Velna Ochs, MD  Cetirizine HCl 10 MG CAPS Take 1 capsule (10 mg total) by mouth daily. Patient taking differently: Take 10 mg by mouth daily as needed (allergies).  06/26/18  Yes Isabelle Course, MD  doxycycline (VIBRAMYCIN) 100 MG capsule Take 1 capsule (100 mg total) by mouth 2 (two) times daily for 7 days. 03/18/19 03/25/19 Yes Khatri, Hina, PA-C  Flaxseed, Linseed, (FLAX SEEDS PO) Take 1 tablet by mouth daily.   Yes [provider]  guaiFENesin-codeine 100-10 MG/5ML syrup Take 5 mLs by mouth at bedtime as needed for cough. 03/21/19  Yes Katherine Roan, MD  hydrochlorothiazide (HYDRODIURIL) 25 MG tablet Take 25 mg by mouth daily. 02/06/19  Yes [provider]  lisinopril (PRINIVIL,ZESTRIL) 20 MG tablet Take 1 tablet (20 mg total) by mouth daily. 10/08/18  Yes Velna Ochs, MD  predniSONE (STERAPRED UNI-PAK 21 TAB) 10 MG (21) TBPK tablet Take by mouth daily. Take 6 tabs by mouth daily  for 2 days, then 5 tabs for 2 days, then 4 tabs for 2 days, then 3 tabs for 2 days, 2 tabs for 2 days, then 1 tab by mouth daily for 2 days 03/18/19  Yes Khatri, Hina, PA-C  QVAR REDIHALER 40 MCG/ACT inhaler INHALE 2 PUFFS INTO THE LUNGS TWICE DAILY 06/20/18  Yes Velna Ochs, MD    Physical Exam: Vitals:   03/23/19 1518  03/23/19 1519 03/23/19 1530 03/23/19 1600  BP: (!) 163/96  (!) 168/94 (!) 177/90  Pulse: 100 98 96 100  Resp: (!) 29 (!) 21 (!) 29 (!) 27  Temp:      TempSrc:      SpO2: 97% 98% 95% 96%  Weight:       Wt Readings from Last 3 Encounters:  03/23/19 (!) 145.2 kg  03/18/19 (!) 145.2 kg  01/18/19 (!) 151.5 kg   Body mass index is 48.67 kg/m.  General exam: Morbidly obese African-American female.  Watery eyes.  Continued shortness of breath on my evaluation but oxygen saturation 98% on 2 L by nasal cannula. Skin: No rashes, lesions or ulcers. HEENT: Watery eyes Lungs: Clear to auscultation bilaterally CVS: Regular rate and rhythm, no murmur GI/Abd soft, nontender,  nondistended, bowel sound present  CNS: alert, awake, oriented x3 Psychiatry: Mood & affect appropriate.  Extremities: Trace to 1+ bilateral pedal edema, no calf tenderness  Labs on Admission:   CBC: Recent Labs  Lab 03/23/19 1303  WBC 9.2  NEUTROABS 8.1*  HGB 10.6*  HCT 31.9*  MCV 82.9  PLT 378    Basic Metabolic Panel: Recent Labs  Lab 03/23/19 1303  NA 136  K 3.2*  CL 97*  CO2 29  GLUCOSE 138*  BUN 24*  CREATININE 0.82  CALCIUM 8.9    Liver Function Tests: Recent Labs  Lab 03/23/19 1303  AST 26  ALT 23  ALKPHOS 104  BILITOT 0.7  PROT 7.5  ALBUMIN 3.5   No results for input(s): LIPASE, AMYLASE in the last 168 hours. No results for input(s): AMMONIA in the last 168 hours.  Cardiac Enzymes: Recent Labs  Lab 03/23/19 1303  TROPONINI <0.03    BNP (last 3 results) Recent Labs    03/23/19 1303  BNP 38.2    ProBNP (last 3 results) No results for input(s): PROBNP in the last 8760 hours.  CBG: No results for input(s): GLUCAP in the last 168 hours.  Lipase  No results found for: LIPASE   Urinalysis No results found for: COLORURINE, APPEARANCEUR, LABSPEC, PHURINE, GLUCOSEU, HGBUR, BILIRUBINUR, KETONESUR, PROTEINUR, UROBILINOGEN, NITRITE, LEUKOCYTESUR   Drugs of Abuse  No results found for: LABOPIA, COCAINSCRNUR, LABBENZ, AMPHETMU, THCU, LABBARB    Radiological Exams on Admission: Dg Chest Port 1 View  Result Date: 03/23/2019 CLINICAL DATA:  63 year old female with history of pneumonia. Worsening shortness of breath and productive cough. EXAM: PORTABLE CHEST 1 VIEW COMPARISON:  Chest x-ray 03/18/2019. FINDINGS: Patchy multifocal ill-defined airspace disease throughout the lungs bilaterally, significantly increased compared to the prior examination, compatible with progressive multilobar pneumonia. No pleural effusions. No evidence of pulmonary edema. Heart size is mildly enlarged. The patient is rotated to the right on today's exam, resulting in  distortion of the mediastinal contours and reduced diagnostic sensitivity and specificity for mediastinal pathology. IMPRESSION: 1. Worsening bilateral multilobar pneumonia, concerning for potential viral infection. Electronically Signed   By: Vinnie Langton M.D.   On: 03/23/2019 14:08   ----------------------------------------------------------------------------------------------------------------------------------------------------------- Severity of Illness: The appropriate patient status for this patient is INPATIENT. Inpatient status is judged to be reasonable and necessary in order to provide the required intensity of service to ensure the patient's safety. The patient's presenting symptoms, physical exam findings, and initial radiographic and laboratory data in the context of their chronic comorbidities is felt to place them at high risk for further clinical deterioration. Furthermore, it is not anticipated that the patient will be medically stable for discharge from  the hospital within 2 midnights of admission. The following factors support the patient status of inpatient.   " The patient's presenting symptoms include cough, shortness of breath, fever. " The worrisome physical exam findings include fever, pedal edema. " The initial radiographic and laboratory data are worrisome because of COVID-19 test positive. " The chronic co-morbidities include hypertension.   * I certify that at the point of admission it is my clinical judgment that the patient will require inpatient hospital care spanning beyond 2 midnights from the point of admission due to high intensity of service, high risk for further deterioration and high frequency of surveillance required.*   Signed, Terrilee Croak, MD [facility] 03/23/2019

## 2019-03-23 NOTE — ED Notes (Signed)
Bed: WA17 Expected date: 03/23/19 Expected time: 12:29 PM Means of arrival: Ambulance Comments: Recent dx Pneumonia

## 2019-03-23 NOTE — ED Notes (Signed)
X-ray at bedside

## 2019-03-23 NOTE — ED Notes (Signed)
Pt gave verbal consent that staff may speak with Daughter. Family updated on plan of care. Beryl Meager (daughter) [phone]

## 2019-03-23 NOTE — ED Provider Notes (Signed)
[place] DEPT Provider Note   CSN: [id number] Arrival date & time: 03/23/19  1236    History   Chief Complaint Chief Complaint  Patient presents with  . Shortness of Breath  . Pneumonia    HPI Kayla Roberts is a 63 y.o. female.     Patient is a 63 year old female with past medical history of hypertension, asthma, GERD, obesity.  She was recently diagnosed with pneumonia and has been taking doxycycline and prednisone for the past 5 days.  She states that her symptoms are worsening.  She is coughing up sputum and complains of worsening shortness of breath.  She denies fevers or chills.  The history is provided by the patient.  Shortness of Breath  Severity:  Moderate Onset quality:  Gradual Duration:  1 week Progression:  Worsening Chronicity:  New Relieved by:  Nothing Worsened by:  Nothing Ineffective treatments: Doxycycline and prednisone. Associated symptoms: cough and sputum production   Associated symptoms: no hemoptysis     Past Medical History:  Diagnosis Date  . Anxiety   . Asthma   . Depression   . GERD (gastroesophageal reflux disease)   . Hyperlipidemia   . Hypertension   . Morbid obesity [facility])     Patient Active Problem List   Diagnosis Date Noted  . Community acquired pneumonia of left lung 03/21/2019  . Acute midline low back pain without sciatica 12/06/2018  . Post-viral cough syndrome 06/26/2018  . Chest pain 03/20/2018  . Internal hemorrhoid 10/13/2017  . Healthcare maintenance 10/17/2016  . Morbid obesity ([place]) 09/23/2016  . Primary localized osteoarthritis of knee 02/02/2016  . ANEMIA NOS 09/17/2007  . HYPERCHOLESTEROLEMIA 09/25/2006  . Major depression, chronic 09/25/2006  . Essential hypertension 09/25/2006  . Moderate persistent asthma 09/25/2006  . GERD 09/25/2006    Past Surgical History:  Procedure Laterality Date  . CERVICAL DISCECTOMY  2002  . COLONOSCOPY WITH PROPOFOL N/A 01/18/2019   Procedure: COLONOSCOPY WITH PROPOFOL;  Surgeon: Clarene Essex, MD;  [facility];  Service: Endoscopy;  Laterality: N/A;  . POLYPECTOMY  01/18/2019   Procedure: POLYPECTOMY;  Surgeon: Clarene Essex, MD;  Location: [facility];  Service: Endoscopy;;     OB History   No obstetric history on file.      Home Medications    Prior to Admission medications   Medication Sig Start Date End Date Taking? Authorizing Provider  albuterol (PROVENTIL HFA;VENTOLIN HFA) 108 (90 Base) MCG/ACT inhaler INHALE 2 PUFFS INTO THE LUNGS EVERY 6 HOURS AS NEEDED FOR WHEEZING OR SHORTNESS OF BREATH 02/06/19   Velna Ochs, MD  amLODipine (NORVASC) 5 MG tablet TAKE 1 TABLET(5 MG) BY MOUTH DAILY 12/07/18   Velna Ochs, MD  aspirin 81 MG chewable tablet Chew 81 mg by mouth once.    [provider]  atorvastatin (LIPITOR) 20 MG tablet Take 1 tablet (20 mg total) by mouth daily. 02/19/18 02/19/19  Velna Ochs, MD  Cetirizine HCl 10 MG CAPS Take 1 capsule (10 mg total) by mouth daily. 06/26/18   Isabelle Course, MD  chlorthalidone (HYGROTON) 50 MG tablet TAKE 1 TABLET(50 MG) BY MOUTH DAILY 02/06/19   Velna Ochs, MD  cyclobenzaprine (FLEXERIL) 5 MG tablet Take 1 tablet (5 mg total) by mouth 3 (three) times daily as needed for muscle spasms. 12/06/18   Kathi Ludwig, MD  doxycycline (VIBRAMYCIN) 100 MG capsule Take 1 capsule (100 mg total) by mouth 2 (two) times daily for 7 days. 03/18/19 03/25/19  Delia Heady, PA-C  guaiFENesin-codeine 100-10 MG/5ML syrup Take 5 mLs by mouth at bedtime as needed for cough. 03/21/19   Katherine Roan, MD  ibuprofen (ADVIL,MOTRIN) 800 MG tablet Take 1 tablet (800 mg total) by mouth every 8 (eight) hours as needed. 12/06/18   Kathi Ludwig, MD  lisinopril (PRINIVIL,ZESTRIL) 20 MG tablet Take 1 tablet (20 mg total) by mouth daily. 10/08/18   Velna Ochs, MD  predniSONE (STERAPRED UNI-PAK 21 TAB) 10 MG (21) TBPK tablet Take by mouth daily. Take 6  tabs by mouth daily  for 2 days, then 5 tabs for 2 days, then 4 tabs for 2 days, then 3 tabs for 2 days, 2 tabs for 2 days, then 1 tab by mouth daily for 2 days 03/18/19   Delia Heady, PA-C  QVAR REDIHALER 40 MCG/ACT inhaler INHALE 2 PUFFS INTO THE LUNGS TWICE DAILY 06/20/18   Velna Ochs, MD    Family History Family History  Problem Relation Age of Onset  . Hypertension Mother   . Hyperlipidemia Mother   . Heart disease Father   . Hyperlipidemia Father   . Hypertension Father   . Hypertension Brother   . Heart disease Brother   . Hypertension Daughter   . Diabetes Sister     Social History Social History   Tobacco Use  . Smoking status: Never Smoker  . Smokeless tobacco: Never Used  Substance Use Topics  . Alcohol use: No  . Drug use: No     Allergies   Patient has no known allergies.   Review of Systems Review of Systems  Respiratory: Positive for cough and sputum production. Negative for hemoptysis.   All other systems reviewed and are negative.    Physical Exam Updated Vital Signs BP (!) 189/95 (BP Location: Right Arm)   Pulse (!) 104   Temp 99.4 F (37.4 C) (Oral)   Resp (!) 22   Wt (!) 145.2 kg   SpO2 100%   BMI 48.67 kg/m   Physical Exam Vitals signs and nursing note reviewed.  Constitutional:      General: She is not in acute distress.    Appearance: She is well-developed. She is not diaphoretic.  HENT:     Head: Normocephalic and atraumatic.  Neck:     Musculoskeletal: Normal range of motion and neck supple.  Cardiovascular:     Rate and Rhythm: Normal rate and regular rhythm.     Heart sounds: No murmur. No friction rub. No gallop.   Pulmonary:     Effort: Pulmonary effort is normal. No respiratory distress.     Breath sounds: Examination of the right-middle field reveals rhonchi. Examination of the left-middle field reveals rhonchi. Rhonchi present. No wheezing.  Abdominal:     General: Bowel sounds are normal. There is no  distension.     Palpations: Abdomen is soft.     Tenderness: There is no abdominal tenderness.  Musculoskeletal: Normal range of motion.     Right lower leg: She exhibits no tenderness. No edema.     Left lower leg: She exhibits no tenderness. No edema.  Skin:    General: Skin is warm and dry.  Neurological:     Mental Status: She is alert and oriented to person, place, and time.      ED Treatments / Results  Labs (all labs ordered are listed, but only abnormal results are displayed) Labs Reviewed  SARS CORONAVIRUS 2 ([place] [facility])  CBC WITH DIFFERENTIAL/PLATELET  COMPREHENSIVE  METABOLIC PANEL  TROPONIN I  BRAIN NATRIURETIC PEPTIDE    EKG None  Radiology No results found.  Procedures Procedures (including critical care time)  Medications Ordered in ED Medications - No data to display   Initial Impression / Assessment and Plan / ED Course  I have reviewed the triage vital signs and the nursing notes.  Pertinent labs & imaging results that were available during my care of the patient were reviewed by me and considered in my medical decision making (see chart for details).  Patient presents with worsening cough and breathing for the past 5 days despite being on doxycycline and prednisone.  Patient's x-ray shows multifocal pneumonia and her COVID-19 test is positive.  Patient arrived here hypoxic with O2 sats in the upper 80s.  She has an oxygen requirement of 2 L nasal cannula and does not use home oxygen.  Patient will be admitted to the [place] hospital.  I have spoken with the hospitalist who agrees to admit.  CRITICAL CARE Performed by: Veryl Speak Total critical care time: 35 minutes Critical care time was exclusive of separately billable procedures and treating other patients. Critical care was necessary to treat or prevent imminent or life-threatening deterioration. Critical care was time spent personally by me on the  following activities: development of treatment plan with patient and/or surrogate as well as nursing, discussions with consultants, evaluation of patient's response to treatment, examination of patient, obtaining history from patient or surrogate, ordering and performing treatments and interventions, ordering and review of laboratory studies, ordering and review of radiographic studies, pulse oximetry and re-evaluation of patient's condition.   Final Clinical Impressions(s) / ED Diagnoses   Final diagnoses:  None    ED Discharge Orders    None       Veryl Speak, MD 03/23/19 [phone]

## 2019-03-23 NOTE — ED Triage Notes (Signed)
Pt arrives via [facility] from home. Per EMS; Pt diagnosed with pneumonia on 4/20. Pt sent home with doxy and prednisone. Pt reports taking medication as prescribed. Pt endorses worsening shortness of breath and productive cough.

## 2019-03-23 NOTE — ED Notes (Signed)
Report given to [facility] team

## 2019-03-23 NOTE — ED Notes (Signed)
ED TO INPATIENT HANDOFF REPORT  Name/Age/Gender [name] 63 y.o. female  Code Status    Code Status Orders  (From admission, onward)         Start     Ordered   03/23/19 1534  Full code  Continuous     03/23/19 1533        Code Status History    This patient has a current code status but no historical code status.      Home/SNF/Other Home  Chief Complaint SHoB, pneumonia  Level of Care/Admitting Diagnosis ED Disposition    ED Disposition Condition Comment   Admit  Hospital Area: [place] [[id number]]  Level of Care: Progressive [102]  Covid Evaluation: Confirmed COVID Positive  Isolation Risk Level: Low Risk  (Less than 4L [place] supplementation)  Diagnosis: Pneumonia due to COVID-19 virus [[id number]]  Admitting Physician: Terrilee Croak [[id number]]  Attending Physician: Terrilee Croak [[id number]]  Estimated length of stay: past midnight tomorrow  Certification:: I certify this patient will need inpatient services for at least 2 midnights  PT Class (Do Not Modify): Inpatient [101]  PT Acc Code (Do Not Modify): Private [1]       Medical History Past Medical History:  Diagnosis Date  . Anxiety   . Asthma   . Depression   . GERD (gastroesophageal reflux disease)   . Hyperlipidemia   . Hypertension   . Morbid obesity ([place])     Allergies No Known Allergies  IV Location/Drains/Wounds Patient Lines/Drains/Airways Status   Active Line/Drains/Airways    Name:   Placement date:   Placement time:   Site:   Days:   Peripheral IV 03/23/19 Left Forearm   03/23/19    -    Forearm   less than 1   Peripheral IV 03/23/19 Right Antecubital   03/23/19    1710    Antecubital   less than 1   External Urinary Catheter   03/23/19    1509    -   less than 1          Labs/Imaging Results for orders placed or performed during the hospital encounter of 03/23/19 (from the past 48 hour(s))  CBC WITH DIFFERENTIAL     Status: Abnormal   Collection Time:  03/23/19  1:03 PM  Result Value Ref Range   WBC 9.2 4.0 - 10.5 K/uL   RBC 3.85 (L) 3.87 - 5.11 MIL/uL   Hemoglobin 10.6 (L) 12.0 - 15.0 g/dL   HCT 31.9 (L) 36.0 - 46.0 %   MCV 82.9 80.0 - 100.0 fL   MCH 27.5 26.0 - 34.0 pg   MCHC 33.2 30.0 - 36.0 g/dL   RDW 13.6 11.5 - 15.5 %   Platelets 263 150 - 400 K/uL   nRBC 0.0 0.0 - 0.2 %   Neutrophils Relative % 89 %   Neutro Abs 8.1 (H) 1.7 - 7.7 K/uL   Lymphocytes Relative 7 %   Lymphs Abs 0.7 0.7 - 4.0 K/uL   Monocytes Relative 4 %   Monocytes Absolute 0.4 0.1 - 1.0 K/uL   Eosinophils Relative 0 %   Eosinophils Absolute 0.0 0.0 - 0.5 K/uL   Basophils Relative 0 %   Basophils Absolute 0.0 0.0 - 0.1 K/uL   Immature Granulocytes 0 %   Abs Immature Granulocytes 0.04 0.00 - 0.07 K/uL    Comment: Performed at [facility], [place] [address] [place], [place] [address]  Comprehensive metabolic panel  Status: Abnormal   Collection Time: 03/23/19  1:03 PM  Result Value Ref Range   Sodium 136 135 - 145 mmol/L   Potassium 3.2 (L) 3.5 - 5.1 mmol/L   Chloride 97 (L) 98 - 111 mmol/L   CO2 29 22 - 32 mmol/L   Glucose, Bld 138 (H) 70 - 99 mg/dL   BUN 24 (H) 8 - 23 mg/dL   Creatinine, Ser 0.82 0.44 - 1.00 mg/dL   Calcium 8.9 8.9 - 10.3 mg/dL   Total Protein 7.5 6.5 - 8.1 g/dL   Albumin 3.5 3.5 - 5.0 g/dL   AST 26 15 - 41 U/L   ALT 23 0 - 44 U/L   Alkaline Phosphatase 104 38 - 126 U/L   Total Bilirubin 0.7 0.3 - 1.2 mg/dL   GFR calc non Af Amer >60 >60 mL/min   GFR calc Af Amer >60 >60 mL/min   Anion gap 10 5 - 15    Comment: Performed at [facility], [place] [address], [place], [place] [address]  Troponin I - Once     Status: None   Collection Time: 03/23/19  1:03 PM  Result Value Ref Range   Troponin I <0.03 <0.03 ng/mL    Comment: Performed at [facility], [place] [address], [place], [place] [address]  Brain natriuretic peptide     Status: None   Collection Time: 03/23/19  1:03 PM   Result Value Ref Range   B Natriuretic Peptide 38.2 0.0 - 100.0 pg/mL    Comment: Performed at [facility], [place] [address], [place], [place] [address]  SARS Coronavirus 2 [facility] order, Performed in [facility] hospital lab)     Status: Abnormal   Collection Time: 03/23/19  1:03 PM  Result Value Ref Range   SARS Coronavirus 2 POSITIVE (A) NEGATIVE    Comment: RESULT CALLED TO, READ BACK BY AND VERIFIED WITH: P DOWD,RN 03/23/19 1457 RHOLMES (NOTE) If result is NEGATIVE SARS-CoV-2 target nucleic acids are NOT DETECTED. The SARS-CoV-2 RNA is generally detectable in upper and lower  respiratory specimens during the acute phase of infection. The lowest  concentration of SARS-CoV-2 viral copies this assay can detect is 250  copies / mL. A negative result does not preclude SARS-CoV-2 infection  and should not be used as the sole basis for treatment or other  patient management decisions.  A negative result may occur with  improper specimen collection / handling, submission of specimen other  than nasopharyngeal swab, presence of viral mutation(s) within the  areas targeted by this assay, and inadequate number of viral copies  (<250 copies / mL). A negative result must be combined with clinical  observations, patient history, and epidemiological information. If result is POSITIVE SARS-CoV-2 target nucleic acids are DETECTED. The SA RS-CoV-2 RNA is generally detectable in upper and lower  respiratory specimens during the acute phase of infection.  Positive  results are indicative of active infection with SARS-CoV-2.  Clinical  correlation with patient history and other diagnostic information is  necessary to determine patient infection status.  Positive results do  not rule out bacterial infection or co-infection with other viruses. If result is PRESUMPTIVE POSTIVE SARS-CoV-2 nucleic acids MAY BE PRESENT.   A presumptive positive result was obtained on the submitted  specimen  and confirmed on repeat testing.  While 2019 novel coronavirus  (SARS-CoV-2) nucleic acids may be present in the submitted sample  additional confirmatory testing may be necessary for epidemiological  and / or clinical management purposes  to differentiate between  SARS-CoV-2 and other Sarbecovirus currently known to infect humans.  If clinically indicated additional testing with an alternate test  methodology [phone] is advi sed. The SARS-CoV-2 RNA is generally  detectable in upper and lower respiratory specimens during the acute  phase of infection. The expected result is Negative. Fact Sheet for Patients:  StrictlyIdeas.no Fact Sheet for Healthcare Providers: BankingDealers.co.za This test is not yet approved or cleared by the [place] FDA and has been authorized for detection and/or diagnosis of SARS-CoV-2 by FDA under an Emergency Use Authorization (EUA).  This EUA will remain in effect (meaning this test can be used) for the duration of the COVID-19 declaration under Section 564(b)(1) of the Act, 21 U.S.C. section 360bbb-3(b)(1), unless the authorization is terminated or revoked sooner. Performed at [facility], [place] [address], [place], [place] [address]   ABO/Rh     Status: None   Collection Time: 03/23/19  5:09 PM  Result Value Ref Range   ABO/RH(D)      B POS Performed at [facility] [place], [place] [address], [place], [place] [address]    Dg Chest Port 1 View  Result Date: 03/23/2019 CLINICAL DATA:  63 year old female with history of pneumonia. Worsening shortness of breath and productive cough. EXAM: PORTABLE CHEST 1 VIEW COMPARISON:  Chest x-ray 03/18/2019. FINDINGS: Patchy multifocal ill-defined airspace disease throughout the lungs bilaterally, significantly increased compared to the prior examination, compatible with progressive multilobar pneumonia. No pleural effusions. No  evidence of pulmonary edema. Heart size is mildly enlarged. The patient is rotated to the right on today's exam, resulting in distortion of the mediastinal contours and reduced diagnostic sensitivity and specificity for mediastinal pathology. IMPRESSION: 1. Worsening bilateral multilobar pneumonia, concerning for potential viral infection. Electronically Signed   By: Vinnie Langton M.D.   On: 03/23/2019 14:08    Pending Labs Unresulted Labs (From admission, onward)    Start     Ordered   03/24/19 0500  HIV antibody (Routine Testing)  Tomorrow morning,   R     03/23/19 1533   03/24/19 2130  Basic metabolic panel  Daily,   R     03/23/19 1533   03/24/19 0500  CBC  Daily,   R     03/23/19 1533   03/24/19 0500  C-reactive protein  Daily,   R     03/23/19 1629   03/24/19 0500  D-dimer, quantitative (not at [facility])  Daily,   R     03/23/19 1629   03/24/19 0500  Ferritin  Daily,   R     03/23/19 1629   03/23/19 1649  Ferritin  Once,   R     03/23/19 1649   03/23/19 1629  C-reactive protein  Once,   R     03/23/19 1629   03/23/19 1629  D-dimer, quantitative (not at [facility])  Once,   R     03/23/19 1629   03/23/19 1629  Lactate dehydrogenase  Once,   R     03/23/19 1629          Vitals/Pain Today's Vitals   03/23/19 1700 03/23/19 1712 03/23/19 1723 03/23/19 1730  BP: (!) 162/93   (!) 170/97  Pulse: 90 90 87 88  Resp: (!) 34 (!) 25 (!) 25 (!) 22  Temp:      TempSrc:      SpO2: 98% 99% 100% 100%  Weight:  PainSc:        Isolation Precautions Droplet and Contact precautions  Medications Medications  acetaminophen (TYLENOL) tablet 650 mg (has no administration in time range)    Or  acetaminophen (TYLENOL) suppository 650 mg (has no administration in time range)  senna (SENOKOT) tablet 8.6 mg (8.6 mg Oral Not Given 03/23/19 1637)  magnesium hydroxide (MILK OF MAGNESIA) suspension 30 mL (has no administration in time range)  sorbitol 70 % solution 30 mL (has no administration  in time range)  sodium phosphate (FLEET) 7-19 GM/118ML enema 1 enema (has no administration in time range)  ondansetron (ZOFRAN) tablet 4 mg (has no administration in time range)    Or  ondansetron (ZOFRAN) injection 4 mg (has no administration in time range)  enoxaparin (LOVENOX) injection 40 mg (40 mg Subcutaneous Not Given 03/23/19 1636)  guaiFENesin-dextromethorphan (ROBITUSSIN DM) 100-10 MG/5ML syrup 10 mL (has no administration in time range)  chlorpheniramine-HYDROcodone (TUSSIONEX) 10-8 MG/5ML suspension 5 mL (has no administration in time range)  vitamin C (ASCORBIC ACID) tablet 500 mg (500 mg Oral Not Given 03/23/19 1724)  zinc sulfate capsule 220 mg (220 mg Oral Not Given 03/23/19 1724)    Mobility walks

## 2019-03-24 DIAGNOSIS — R0902 Hypoxemia: Secondary | ICD-10-CM

## 2019-03-24 LAB — CBC
HCT: 32.8 % — ABNORMAL LOW (ref 36.0–46.0)
Hemoglobin: 10.8 g/dL — ABNORMAL LOW (ref 12.0–15.0)
MCH: 27 pg (ref 26.0–34.0)
MCHC: 32.9 g/dL (ref 30.0–36.0)
MCV: 82 fL (ref 80.0–100.0)
Platelets: 300 10*3/uL (ref 150–400)
RBC: 4 MIL/uL (ref 3.87–5.11)
RDW: 13.3 % (ref 11.5–15.5)
WBC: 6.2 10*3/uL (ref 4.0–10.5)
nRBC: 0 % (ref 0.0–0.2)

## 2019-03-24 LAB — BASIC METABOLIC PANEL
Anion gap: 11 (ref 5–15)
BUN: 19 mg/dL (ref 8–23)
CO2: 32 mmol/L (ref 22–32)
Calcium: 9.2 mg/dL (ref 8.9–10.3)
Chloride: 94 mmol/L — ABNORMAL LOW (ref 98–111)
Creatinine, Ser: 0.79 mg/dL (ref 0.44–1.00)
GFR calc Af Amer: >60 mL/min (ref >60)
GFR calc non Af Amer: >60 mL/min (ref >60)
Glucose, Bld: 141 mg/dL — ABNORMAL HIGH (ref 70–99)
Potassium: 3.1 mmol/L — ABNORMAL LOW (ref 3.5–5.1)
Sodium: 137 mmol/L (ref 135–145)

## 2019-03-24 LAB — FERRITIN: Ferritin: 167 ng/mL (ref 11–307)

## 2019-03-24 LAB — D-DIMER, QUANTITATIVE: D-Dimer, Quant: 1.84 ug/mL-FEU — ABNORMAL HIGH (ref 0.00–0.50)

## 2019-03-24 LAB — C-REACTIVE PROTEIN: CRP: 20.2 mg/dL — ABNORMAL HIGH (ref <1.0)

## 2019-03-24 MED ORDER — ALBUTEROL SULFATE HFA 108 (90 BASE) MCG/ACT IN AERS
2.0000 | INHALATION_SPRAY | Freq: Four times a day (QID) | RESPIRATORY_TRACT | Status: DC | PRN
  Administered 2019-03-24 – 2019-03-28 (×4): 2 via RESPIRATORY_TRACT
  Filled 2019-03-24: qty 6.7

## 2019-03-24 MED ORDER — ATORVASTATIN CALCIUM 10 MG PO TABS: 20.0000 mg | ORAL_TABLET | Freq: Every day | ORAL | Status: DC

## 2019-03-24 MED ORDER — PANTOPRAZOLE SODIUM 40 MG PO TBEC
40.0000 mg | DELAYED_RELEASE_TABLET | Freq: Every day | ORAL | Status: DC
  Administered 2019-03-24 – 2019-03-29 (×6): 40 mg via ORAL
  Filled 2019-03-24 (×6): qty 1

## 2019-03-24 MED ORDER — BECLOMETHASONE DIPROP HFA 40 MCG/ACT IN AERB
2.0000 | INHALATION_SPRAY | Freq: Two times a day (BID) | RESPIRATORY_TRACT | Status: DC
  Administered 2019-03-24 – 2019-04-03 (×19): 2 via RESPIRATORY_TRACT
  Filled 2019-03-24: qty 10.6

## 2019-03-24 MED ORDER — LISINOPRIL 20 MG PO TABS
20.0000 mg | ORAL_TABLET | Freq: Every day | ORAL | Status: DC
  Administered 2019-03-24 – 2019-03-26 (×3): 20 mg via ORAL
  Filled 2019-03-24 (×3): qty 1

## 2019-03-24 MED ORDER — AMLODIPINE BESYLATE 5 MG PO TABS
5.0000 mg | ORAL_TABLET | Freq: Every day | ORAL | Status: DC
  Administered 2019-03-24 – 2019-03-26 (×3): 5 mg via ORAL
  Filled 2019-03-24 (×3): qty 1

## 2019-03-24 MED ORDER — IPRATROPIUM-ALBUTEROL 20-100 MCG/ACT IN AERS
1.0000 | INHALATION_SPRAY | Freq: Four times a day (QID) | RESPIRATORY_TRACT | Status: DC
  Administered 2019-03-24 – 2019-03-26 (×11): 1 via RESPIRATORY_TRACT
  Filled 2019-03-24: qty 4

## 2019-03-24 MED ORDER — ENOXAPARIN SODIUM 40 MG/0.4ML ~~LOC~~ SOLN
40.0000 mg | Freq: Two times a day (BID) | SUBCUTANEOUS | Status: DC
  Administered 2019-03-24 – 2019-03-28 (×9): 40 mg via SUBCUTANEOUS
  Filled 2019-03-24 (×10): qty 0.4

## 2019-03-24 MED ORDER — GUAIFENESIN-CODEINE 100-10 MG/5ML PO SOLN: 5.0000 mL | Freq: Every evening | ORAL | Status: DC | PRN

## 2019-03-24 MED ORDER — HYDROCHLOROTHIAZIDE 25 MG PO TABS
25.0000 mg | ORAL_TABLET | Freq: Every day | ORAL | Status: DC
  Administered 2019-03-24 – 2019-03-26 (×3): 25 mg via ORAL
  Filled 2019-03-24 (×3): qty 1

## 2019-03-24 MED ORDER — POTASSIUM CHLORIDE CRYS ER 20 MEQ PO TBCR
40.0000 meq | EXTENDED_RELEASE_TABLET | ORAL | Status: AC
  Administered 2019-03-24 – 2019-03-25 (×3): 40 meq via ORAL
  Filled 2019-03-24 (×3): qty 2

## 2019-03-24 MED ORDER — METHYLPREDNISOLONE SODIUM SUCC 125 MG IJ SOLR
40.0000 mg | Freq: Three times a day (TID) | INTRAMUSCULAR | Status: DC
  Administered 2019-03-24 – 2019-03-26 (×6): 40 mg via INTRAVENOUS
  Filled 2019-03-24 (×7): qty 2

## 2019-03-24 MED ORDER — ASPIRIN EC 81 MG PO TBEC
81.0000 mg | DELAYED_RELEASE_TABLET | Freq: Every day | ORAL | Status: DC
  Administered 2019-03-24 – 2019-03-31 (×8): 81 mg via ORAL
  Filled 2019-03-24 (×8): qty 1

## 2019-03-24 MED ORDER — POTASSIUM CHLORIDE CRYS ER 20 MEQ PO TBCR: 30.0000 meq | EXTENDED_RELEASE_TABLET | ORAL | Status: DC

## 2019-03-24 MED ORDER — GUAIFENESIN ER 600 MG PO TB12
1200.0000 mg | ORAL_TABLET | Freq: Two times a day (BID) | ORAL | Status: DC
  Administered 2019-03-24 – 2019-04-03 (×21): 1200 mg via ORAL
  Filled 2019-03-24 (×21): qty 2

## 2019-03-24 MED ORDER — FUROSEMIDE 10 MG/ML IJ SOLN
60.0000 mg | Freq: Once | INTRAMUSCULAR | Status: AC
  Administered 2019-03-24: 16:00:00 60 mg via INTRAVENOUS
  Filled 2019-03-24: qty 6

## 2019-03-24 NOTE — Progress Notes (Signed)
PROGRESS NOTE                                                                                                                                                                                                             Patient Demographics:    Kayla Roberts, is a 63 y.o. female, DOB - 03/13/1956, MVE:[id number]  Admit date - 03/23/2019   Admitting Physician Terrilee Croak, MD  Outpatient Primary MD for the patient is Velna Ochs, MD  LOS - 1   Chief Complaint  Patient presents with  . Shortness of Breath  . Pneumonia       Brief Narrative     63 y.o. female with past medical history of hypertension, hyperlipidemia, morbid obesity, GERD, depression, anxiety, asthma, presented from home with complaint of worsening shortness of breath, fever, cough and lethargy for last 1 week. In ED, patient was saturating 90% on 2 L nasal cannula, chest x-ray with worsening multilobar pneumonia, COVID-19 test came back positive.   Subjective:    Kayla Roberts today for generalized weakness, fatigue, shortness of breath, cough, productive, had fever 101.5 overnight    Assessment  & Plan :    Active Problems:   Pneumonia due to COVID-19 virus  Acute  hypoxic respiratory failure/pneumonia due to COVID-19 infection -Morning she is on 3 L nasal cannula, she dropped to mid 80s when I try to wean her off, have discussed with her, and with staff, she was encouraged out of bed to chair, and to use incentive spirometry, and she was educated and instructed to follow-up prone positions. -I will start on Combivent -We will follow inflammatory markers, she had significant increase in her CRP, I will start on IV Solu-Medrol 40 mg IV every 8 hours. -We will start on IV Lasix  History of hypertension -Pressure is acceptable, continue with home meds including amlodipine, hydrochlorothiazide and lisinopril, continue with PRN hydralazine  History of hyperlipidemia  -Prior to admission, patient was on aspirin and statin, continue with aspirin, will hold statin during hospital stay  Morbid obesity  - Body mass index is 48.67 kg/m.   Hypokalemia -Repleted, monitor closely as on IV diuresis   Code Status : Full  Family Communication  :  D/W daughter via phone  Disposition Plan  : pending further work up  Barriers For Discharge : remains hypoxic,  Consults  :  None  Procedures  : None  DVT Prophylaxis  :  [place] lovenox  Lab Results  Component Value Date   PLT 263 03/23/2019    Antibiotics  :    Anti-infectives (From admission, onward)   None        Objective:   Vitals:   03/24/19 0053 03/24/19 0100 03/24/19 0115 03/24/19 0415  BP:      Pulse:  98    Resp:  19    Temp: (!) 100.9 F (38.3 C)  (!) 101.5 F (38.6 C) 99.4 F (37.4 C)  TempSrc: Oral  Oral Oral  SpO2:  94%    Weight:      Height:        Wt Readings from Last 3 Encounters:  03/23/19 (!) 142 kg  03/18/19 (!) 145.2 kg  01/18/19 (!) 151.5 kg     Intake/Output Summary (Last 24 hours) at 03/24/2019 1003 Last data filed at 03/23/2019 2339 Gross per 24 hour  Intake 300 ml  Output 375 ml  Net -75 ml     Physical Exam  Awake Alert, Oriented X 3, No new F.N deficits, frail, ill-appearing  Symmetrical Chest wall movement, Good air movement bilaterally, bibasilar Rales RRR,No Gallops,Rubs or new Murmurs, No Parasternal Heave +ve B.Sounds, Abd Soft, No tenderness, No rebound - guarding or rigidity. No Cyanosis, Clubbing or edema, No new Rash or bruise      Data Review:    CBC Recent Labs  Lab 03/23/19 1303  WBC 9.2  HGB 10.6*  HCT 31.9*  PLT 263  MCV 82.9  MCH 27.5  MCHC 33.2  RDW 13.6  LYMPHSABS 0.7  MONOABS 0.4  EOSABS 0.0  BASOSABS 0.0    Chemistries  Recent Labs  Lab 03/23/19 1303  NA 136  K 3.2*  CL 97*  CO2 29  GLUCOSE 138*  BUN 24*  CREATININE 0.82  CALCIUM 8.9  AST 26  ALT 23  ALKPHOS 104  BILITOT 0.7    ------------------------------------------------------------------------------------------------------------------ No results for input(s): CHOL, HDL, LDLCALC, TRIG, CHOLHDL, LDLDIRECT in the last 72 hours.  No results found for: HGBA1C ------------------------------------------------------------------------------------------------------------------ No results for input(s): TSH, T4TOTAL, T3FREE, THYROIDAB in the last 72 hours.  Invalid input(s): FREET3 ------------------------------------------------------------------------------------------------------------------ Recent Labs    03/23/19 1709  FERRITIN 128    Coagulation profile No results for input(s): INR, PROTIME in the last 168 hours.  Recent Labs    03/23/19 1709  DDIMER 1.58*    Cardiac Enzymes Recent Labs  Lab 03/23/19 1303  TROPONINI <0.03   ------------------------------------------------------------------------------------------------------------------    Component Value Date/Time   BNP 38.2 03/23/2019 1303    Inpatient Medications  Scheduled Meds: . amLODipine  5 mg Oral Daily  . aspirin EC  81 mg Oral Daily  . atorvastatin  20 mg Oral Daily  . beclomethasone  2 puff Inhalation BID  . enoxaparin (LOVENOX) injection  40 mg Subcutaneous Q12H  . guaiFENesin  1,200 mg Oral BID  . hydrochlorothiazide  25 mg Oral Daily  . lisinopril  20 mg Oral Daily  . senna  1 tablet Oral BID  . vitamin C  500 mg Oral Daily  . zinc sulfate  220 mg Oral Daily   Continuous Infusions: PRN Meds:.acetaminophen **OR** acetaminophen, albuterol, chlorpheniramine-HYDROcodone, guaiFENesin-codeine, magnesium hydroxide, ondansetron **OR** ondansetron (ZOFRAN) IV, sodium phosphate, sorbitol  Micro Results Recent Results (from the past 240 hour(s))  SARS Coronavirus 2 [facility] order, Performed in [facility] hospital lab)  Status: Abnormal   Collection Time: 03/23/19  1:03 PM  Result Value Ref Range Status   SARS Coronavirus  2 POSITIVE (A) NEGATIVE Final    Comment: RESULT CALLED TO, READ BACK BY AND VERIFIED WITH: P DOWD,RN 03/23/19 1457 RHOLMES (NOTE) If result is NEGATIVE SARS-CoV-2 target nucleic acids are NOT DETECTED. The SARS-CoV-2 RNA is generally detectable in upper and lower  respiratory specimens during the acute phase of infection. The lowest  concentration of SARS-CoV-2 viral copies this assay can detect is 250  copies / mL. A negative result does not preclude SARS-CoV-2 infection  and should not be used as the sole basis for treatment or other  patient management decisions.  A negative result may occur with  improper specimen collection / handling, submission of specimen other  than nasopharyngeal swab, presence of viral mutation(s) within the  areas targeted by this assay, and inadequate number of viral copies  (<250 copies / mL). A negative result must be combined with clinical  observations, patient history, and epidemiological information. If result is POSITIVE SARS-CoV-2 target nucleic acids are DETECTED. The SA RS-CoV-2 RNA is generally detectable in upper and lower  respiratory specimens during the acute phase of infection.  Positive  results are indicative of active infection with SARS-CoV-2.  Clinical  correlation with patient history and other diagnostic information is  necessary to determine patient infection status.  Positive results do  not rule out bacterial infection or co-infection with other viruses. If result is PRESUMPTIVE POSTIVE SARS-CoV-2 nucleic acids MAY BE PRESENT.   A presumptive positive result was obtained on the submitted specimen  and confirmed on repeat testing.  While 2019 novel coronavirus  (SARS-CoV-2) nucleic acids may be present in the submitted sample  additional confirmatory testing may be necessary for epidemiological  and / or clinical management purposes  to differentiate between  SARS-CoV-2 and other Sarbecovirus currently known to infect humans.   If clinically indicated additional testing with an alternate test  methodology [phone] is advi sed. The SARS-CoV-2 RNA is generally  detectable in upper and lower respiratory specimens during the acute  phase of infection. The expected result is Negative. Fact Sheet for Patients:  StrictlyIdeas.no Fact Sheet for Healthcare Providers: BankingDealers.co.za This test is not yet approved or cleared by the [place] FDA and has been authorized for detection and/or diagnosis of SARS-CoV-2 by FDA under an Emergency Use Authorization (EUA).  This EUA will remain in effect (meaning this test can be used) for the duration of the COVID-19 declaration under Section 564(b)(1) of the Act, 21 U.S.C. section 360bbb-3(b)(1), unless the authorization is terminated or revoked sooner. Performed at [facility], [place] [address], [place], [place] [address]     Radiology Reports Dg Chest [place] 1 View  Result Date: 03/23/2019 CLINICAL DATA:  63 year old female with history of pneumonia. Worsening shortness of breath and productive cough. EXAM: PORTABLE CHEST 1 VIEW COMPARISON:  Chest x-ray 03/18/2019. FINDINGS: Patchy multifocal ill-defined airspace disease throughout the lungs bilaterally, significantly increased compared to the prior examination, compatible with progressive multilobar pneumonia. No pleural effusions. No evidence of pulmonary edema. Heart size is mildly enlarged. The patient is rotated to the right on today's exam, resulting in distortion of the mediastinal contours and reduced diagnostic sensitivity and specificity for mediastinal pathology. IMPRESSION: 1. Worsening bilateral multilobar pneumonia, concerning for potential viral infection. Electronically Signed   By: Vinnie Langton M.D.   On: 03/23/2019 14:08   Dg Chest Portable 1 View  Result  Date: 03/18/2019 CLINICAL DATA:  Cough and shortness of breath for the past 5  days. New onset fever today. EXAM: PORTABLE CHEST 1 VIEW COMPARISON:  Chest x-ray dated March 08, 2018. FINDINGS: Stable mild cardiomegaly. Normal mediastinal contours. Normal pulmonary vascularity. New small opacity in the left mid lung. Minimal bibasilar atelectasis. No focal consolidation, pleural effusion, or pneumothorax. Unchanged pleural thickening at the bilateral costophrenic angles. No acute osseous abnormality. IMPRESSION: 1. New small opacity in the left mid lung could reflect developing pneumonia given clinical history. Followup PA and lateral chest X-ray is recommended in 3-4 weeks following trial of antibiotic therapy to ensure resolution. Electronically Signed   By: Titus Dubin M.D.   On: 03/18/2019 19:18     Phillips Climes M.D on 03/24/2019 at 10:03 AM  Between 7am to 7pm - Pager - [phone]  After 7pm go to [email or web address] - password [facility]  [facility] -  Office  [phone]

## 2019-03-24 NOTE — Progress Notes (Signed)
CSW has reviewed patient chart, notes patient from home with 2 daughters. Will likely dc back home when medically stable.   Please contact CSW for any discharge planning needs that may arise.   [place], [place]

## 2019-03-25 ENCOUNTER — Inpatient Hospital Stay (HOSPITAL_COMMUNITY): Payer: Medicare HMO

## 2019-03-25 DIAGNOSIS — R739 Hyperglycemia, unspecified: Secondary | ICD-10-CM

## 2019-03-25 DIAGNOSIS — J9621 Acute and chronic respiratory failure with hypoxia: Secondary | ICD-10-CM

## 2019-03-25 DIAGNOSIS — E119 Type 2 diabetes mellitus without complications: Secondary | ICD-10-CM

## 2019-03-25 LAB — CBC
HCT: 32.3 % — ABNORMAL LOW (ref 36.0–46.0)
Hemoglobin: 10.5 g/dL — ABNORMAL LOW (ref 12.0–15.0)
MCH: 26.7 pg (ref 26.0–34.0)
MCHC: 32.5 g/dL (ref 30.0–36.0)
MCV: 82.2 fL (ref 80.0–100.0)
Platelets: 334 10*3/uL (ref 150–400)
RBC: 3.93 MIL/uL (ref 3.87–5.11)
RDW: 13.3 % (ref 11.5–15.5)
WBC: 4.7 10*3/uL (ref 4.0–10.5)
nRBC: 0 % (ref 0.0–0.2)

## 2019-03-25 LAB — BASIC METABOLIC PANEL
Anion gap: 10 (ref 5–15)
BUN: 30 mg/dL — ABNORMAL HIGH (ref 8–23)
CO2: 30 mmol/L (ref 22–32)
Calcium: 8.9 mg/dL (ref 8.9–10.3)
Chloride: 97 mmol/L — ABNORMAL LOW (ref 98–111)
Creatinine, Ser: 1.06 mg/dL — ABNORMAL HIGH (ref 0.44–1.00)
GFR calc Af Amer: >60 mL/min (ref >60)
GFR calc non Af Amer: 55 mL/min — ABNORMAL LOW (ref >60)
Glucose, Bld: 250 mg/dL — ABNORMAL HIGH (ref 70–99)
Potassium: 4.7 mmol/L (ref 3.5–5.1)
Sodium: 137 mmol/L (ref 135–145)

## 2019-03-25 LAB — GLUCOSE, CAPILLARY
Glucose-Capillary: 238 mg/dL — ABNORMAL HIGH (ref 70–99)
Glucose-Capillary: 367 mg/dL — ABNORMAL HIGH (ref 70–99)
Glucose-Capillary: 179 mg/dL — ABNORMAL HIGH (ref 70–99)
Glucose-Capillary: 329 mg/dL — ABNORMAL HIGH (ref 70–99)

## 2019-03-25 LAB — C-REACTIVE PROTEIN: CRP: 23.6 mg/dL — ABNORMAL HIGH (ref <1.0)

## 2019-03-25 LAB — HIV ANTIBODY (ROUTINE TESTING W REFLEX): HIV Screen 4th Generation wRfx: NONREACTIVE

## 2019-03-25 LAB — D-DIMER, QUANTITATIVE: D-Dimer, Quant: 1.63 ug/mL-FEU — ABNORMAL HIGH (ref 0.00–0.50)

## 2019-03-25 LAB — MAGNESIUM: Magnesium: 1.9 mg/dL (ref 1.7–2.4)

## 2019-03-25 LAB — FERRITIN: Ferritin: 196 ng/mL (ref 11–307)

## 2019-03-25 MED ORDER — ATORVASTATIN CALCIUM 10 MG PO TABS
20.0000 mg | ORAL_TABLET | Freq: Every day | ORAL | Status: DC
  Administered 2019-03-25 – 2019-04-02 (×9): 20 mg via ORAL
  Filled 2019-03-25 (×9): qty 2

## 2019-03-25 MED ORDER — FUROSEMIDE 10 MG/ML IJ SOLN
60.0000 mg | Freq: Once | INTRAMUSCULAR | Status: AC
  Administered 2019-03-25: 08:00:00 60 mg via INTRAVENOUS
  Filled 2019-03-25: qty 6

## 2019-03-25 MED ORDER — MAGNESIUM SULFATE 2 GM/50ML IV SOLN: 2.0000 g | Freq: Once | INTRAVENOUS | Status: DC

## 2019-03-25 MED ORDER — INSULIN ASPART 100 UNIT/ML ~~LOC~~ SOLN
0.0000 [IU] | Freq: Three times a day (TID) | SUBCUTANEOUS | Status: DC
  Administered 2019-03-25: 9 [IU] via SUBCUTANEOUS
  Administered 2019-03-25: 2 [IU] via SUBCUTANEOUS
  Administered 2019-03-25: 08:00:00 3 [IU] via SUBCUTANEOUS
  Administered 2019-03-26: 7 [IU] via SUBCUTANEOUS
  Administered 2019-03-26: 9 [IU] via SUBCUTANEOUS

## 2019-03-25 MED ORDER — MAGNESIUM SULFATE IN D5W 1-5 GM/100ML-% IV SOLN
1.0000 g | Freq: Once | INTRAVENOUS | Status: AC
  Administered 2019-03-25: 08:00:00 1 g via INTRAVENOUS
  Filled 2019-03-25: qty 100

## 2019-03-25 MED ORDER — FUROSEMIDE 10 MG/ML IJ SOLN
60.0000 mg | Freq: Two times a day (BID) | INTRAMUSCULAR | Status: DC
  Administered 2019-03-25 – 2019-03-26 (×2): 60 mg via INTRAVENOUS
  Filled 2019-03-25 (×2): qty 6

## 2019-03-25 MED ORDER — SALINE SPRAY 0.65 % NA SOLN
2.0000 | NASAL | Status: DC | PRN
  Administered 2019-03-25: 18:00:00 2 via NASAL
  Filled 2019-03-25: qty 44

## 2019-03-25 NOTE — Progress Notes (Signed)
PROGRESS NOTE                                                                                                                                                                                                             Patient Demographics:    Kayla Roberts, is a 63 y.o. female, DOB - May 09, 1956, LZJ:[id number]  Admit date - 03/23/2019   Admitting Physician Terrilee Croak, MD  Outpatient Primary MD for the patient is Velna Ochs, MD  LOS - 2   Chief Complaint  Patient presents with  . Shortness of Breath  . Pneumonia       Brief Narrative     63 y.o. female with past medical history of hypertension, hyperlipidemia, morbid obesity, GERD, depression, anxiety, asthma, presented from home with complaint of worsening shortness of breath, fever, cough and lethargy for last 1 week. In ED, patient was saturating 90% on 2 L nasal cannula, chest x-ray with worsening multilobar pneumonia, COVID-19 test came back positive.   Subjective:    Kayla Roberts today to report generalized weakness, some dyspnea, cough, she has been afebrile over last 24 hours.    Assessment  & Plan :    Active Problems:   Pneumonia due to COVID-19 virus   Acute on chronic respiratory failure with hypoxia (HCC)   Hyperglycemia  Acute  hypoxic respiratory failure/pneumonia due to COVID-19 infection -Remains on 3.5 L nasal cannula this morning, encouraged again to use incentive spirometry, she was already out of bed to chair, and she has been compliant with pronating . - continue with  Combivent -Chest x-ray showing some improvement today, but CRP increasing from 20-23, ferritin has been stable, D-dimers are stable as well, so far I think there is no indication for Actemra. -Continue with IV Solu-Medrol 40 mg IV every 8 hours for total of 3 days -He has been diuresed with Lasix 60 mg IV daily, I will increase her dose to twice daily.  History of hypertension -Blood  pressure is acceptable,  continue with home meds including amlodipine, hydrochlorothiazide and lisinopril, continue with PRN hydralazine  History of hyperlipidemia -Prior to admission, continue with home meds aspirin and statin .  Hyperglycemia -most Likely related to steroids, will start on insulin sliding scale  Morbid obesity  - Body mass index is 48.67 kg/m.   Hypokalemia -Repleted, monitor closely  as on IV diuresis   Code Status : Full  Family Communication  :  D/W daughter via phone 03/24/2019  Disposition Plan  : pending further work up  Barriers For Discharge : remains hypoxic, on IV lasix.  Consults  :  None  Procedures  : None  DVT Prophylaxis  :  [place] lovenox  Lab Results  Component Value Date   PLT 334 03/25/2019    Antibiotics  :    Anti-infectives (From admission, onward)   None        Objective:   Vitals:   03/24/19 1613 03/25/19 0335 03/25/19 0800 03/25/19 1010  BP:    124/67  Pulse:      Resp: (!) 24     Temp:  98.9 F (37.2 C) 98.1 F (36.7 C)   TempSrc:  Oral Oral   SpO2:      Weight:      Height:        Wt Readings from Last 3 Encounters:  03/23/19 (!) 142 kg  03/18/19 (!) 145.2 kg  01/18/19 (!) 151.5 kg     Intake/Output Summary (Last 24 hours) at 03/25/2019 1306 Last data filed at 03/25/2019 0900 Gross per 24 hour  Intake 750 ml  Output 1425 ml  Net -675 ml     Physical Exam  Awake Alert, Oriented X 3, No new F.N deficits, Normal affect Symmetrical Chest wall movement, Good air movement bilaterally, bibasilar Rales RRR,No Gallops,Rubs or new Murmurs, No Parasternal Heave +ve B.Sounds, Abd Soft, No tenderness, No rebound - guarding or rigidity. No Cyanosis, Clubbing, mild pitting edema, No new Rash or bruise       Data Review:    CBC Recent Labs  Lab 03/23/19 1303 03/24/19 0855 03/25/19 0505  WBC 9.2 6.2 4.7  HGB 10.6* 10.8* 10.5*  HCT 31.9* 32.8* 32.3*  PLT 263 300 334  MCV 82.9 82.0 82.2  MCH 27.5  27.0 26.7  MCHC 33.2 32.9 32.5  RDW 13.6 13.3 13.3  LYMPHSABS 0.7  --   --   MONOABS 0.4  --   --   EOSABS 0.0  --   --   BASOSABS 0.0  --   --     Chemistries  Recent Labs  Lab 03/23/19 1303 03/24/19 0855 03/25/19 0505  NA 136 137 137  K 3.2* 3.1* 4.7  CL 97* 94* 97*  CO2 29 32 30  GLUCOSE 138* 141* 250*  BUN 24* 19 30*  CREATININE 0.82 0.79 1.06*  CALCIUM 8.9 9.2 8.9  MG  --   --  1.9  AST 26  --   --   ALT 23  --   --   ALKPHOS 104  --   --   BILITOT 0.7  --   --    ------------------------------------------------------------------------------------------------------------------ No results for input(s): CHOL, HDL, LDLCALC, TRIG, CHOLHDL, LDLDIRECT in the last 72 hours.  No results found for: HGBA1C ------------------------------------------------------------------------------------------------------------------ No results for input(s): TSH, T4TOTAL, T3FREE, THYROIDAB in the last 72 hours.  Invalid input(s): FREET3 ------------------------------------------------------------------------------------------------------------------ Recent Labs    03/24/19 0855 03/25/19 0505  FERRITIN 167 196    Coagulation profile No results for input(s): INR, PROTIME in the last 168 hours.  Recent Labs    03/24/19 0855 03/25/19 0505  DDIMER 1.84* 1.63*    Cardiac Enzymes Recent Labs  Lab 03/23/19 1303  TROPONINI <0.03   ------------------------------------------------------------------------------------------------------------------    Component Value Date/Time   BNP 38.2 03/23/2019 1303    Inpatient Medications  Scheduled Meds: . amLODipine  5 mg Oral Daily  . aspirin EC  81 mg Oral Daily  . beclomethasone  2 puff Inhalation BID  . enoxaparin (LOVENOX) injection  40 mg Subcutaneous Q12H  . guaiFENesin  1,200 mg Oral BID  . hydrochlorothiazide  25 mg Oral Daily  . insulin aspart  0-9 Units Subcutaneous TID WC  . Ipratropium-Albuterol  1 puff Inhalation QID   . lisinopril  20 mg Oral Daily  . methylPREDNISolone (SOLU-MEDROL) injection  40 mg Intravenous Q8H  . pantoprazole  40 mg Oral Daily  . senna  1 tablet Oral BID  . vitamin C  500 mg Oral Daily  . zinc sulfate  220 mg Oral Daily   Continuous Infusions: PRN Meds:.acetaminophen **OR** acetaminophen, albuterol, chlorpheniramine-HYDROcodone, guaiFENesin-codeine, magnesium hydroxide, ondansetron **OR** ondansetron (ZOFRAN) IV, sodium phosphate, sorbitol  Micro Results Recent Results (from the past 240 hour(s))  SARS Coronavirus 2 [facility] order, Performed in [facility] hospital lab)     Status: Abnormal   Collection Time: 03/23/19  1:03 PM  Result Value Ref Range Status   SARS Coronavirus 2 POSITIVE (A) NEGATIVE Final    Comment: RESULT CALLED TO, READ BACK BY AND VERIFIED WITH: P DOWD,RN 03/23/19 1457 RHOLMES (NOTE) If result is NEGATIVE SARS-CoV-2 target nucleic acids are NOT DETECTED. The SARS-CoV-2 RNA is generally detectable in upper and lower  respiratory specimens during the acute phase of infection. The lowest  concentration of SARS-CoV-2 viral copies this assay can detect is 250  copies / mL. A negative result does not preclude SARS-CoV-2 infection  and should not be used as the sole basis for treatment or other  patient management decisions.  A negative result may occur with  improper specimen collection / handling, submission of specimen other  than nasopharyngeal swab, presence of viral mutation(s) within the  areas targeted by this assay, and inadequate number of viral copies  (<250 copies / mL). A negative result must be combined with clinical  observations, patient history, and epidemiological information. If result is POSITIVE SARS-CoV-2 target nucleic acids are DETECTED. The SA RS-CoV-2 RNA is generally detectable in upper and lower  respiratory specimens during the acute phase of infection.  Positive  results are indicative of active infection with SARS-CoV-2.   Clinical  correlation with patient history and other diagnostic information is  necessary to determine patient infection status.  Positive results do  not rule out bacterial infection or co-infection with other viruses. If result is PRESUMPTIVE POSTIVE SARS-CoV-2 nucleic acids MAY BE PRESENT.   A presumptive positive result was obtained on the submitted specimen  and confirmed on repeat testing.  While 2019 novel coronavirus  (SARS-CoV-2) nucleic acids may be present in the submitted sample  additional confirmatory testing may be necessary for epidemiological  and / or clinical management purposes  to differentiate between  SARS-CoV-2 and other Sarbecovirus currently known to infect humans.  If clinically indicated additional testing with an alternate test  methodology [phone]) is advi sed. The SARS-CoV-2 RNA is generally  detectable in upper and lower respiratory specimens during the acute  phase of infection. The expected result is Negative. Fact Sheet for Patients:  StrictlyIdeas.no Fact Sheet for Healthcare Providers: BankingDealers.co.za This test is not yet approved or cleared by the [place] FDA and has been authorized for detection and/or diagnosis of SARS-CoV-2 by FDA under an Emergency Use Authorization (EUA).  This EUA will remain in effect (meaning this test can be used) for the duration of the  COVID-19 declaration under Section 564(b)(1) of the Act, 21 U.S.C. section 360bbb-3(b)(1), unless the authorization is terminated or revoked sooner. Performed at [facility] [place] [address] [place], [place] [address]     Radiology Reports Dg Chest Port 1 View  Result Date: 03/25/2019 CLINICAL DATA:  Hypoxia. EXAM: PORTABLE CHEST 1 VIEW COMPARISON:  None. FINDINGS: The heart size is normal. Bilateral interstitial and airspace disease is present. Lung volumes are low. IMPRESSION: 1. Low lung volumes with  multi lobar interstitial and airspace disease concerning for infection/multi lobar pneumonia. Electronically Signed   By: San Morelle M.D.   On: 03/25/2019 10:10   Dg Chest Port 1 View  Result Date: 03/23/2019 CLINICAL DATA:  63 year old female with history of pneumonia. Worsening shortness of breath and productive cough. EXAM: PORTABLE CHEST 1 VIEW COMPARISON:  Chest x-ray 03/18/2019. FINDINGS: Patchy multifocal ill-defined airspace disease throughout the lungs bilaterally, significantly increased compared to the prior examination, compatible with progressive multilobar pneumonia. No pleural effusions. No evidence of pulmonary edema. Heart size is mildly enlarged. The patient is rotated to the right on today's exam, resulting in distortion of the mediastinal contours and reduced diagnostic sensitivity and specificity for mediastinal pathology. IMPRESSION: 1. Worsening bilateral multilobar pneumonia, concerning for potential viral infection. Electronically Signed   By: Vinnie Langton M.D.   On: 03/23/2019 14:08   Dg Chest Portable 1 View  Result Date: 03/18/2019 CLINICAL DATA:  Cough and shortness of breath for the past 5 days. New onset fever today. EXAM: PORTABLE CHEST 1 VIEW COMPARISON:  Chest x-ray dated March 08, 2018. FINDINGS: Stable mild cardiomegaly. Normal mediastinal contours. Normal pulmonary vascularity. New small opacity in the left mid lung. Minimal bibasilar atelectasis. No focal consolidation, pleural effusion, or pneumothorax. Unchanged pleural thickening at the bilateral costophrenic angles. No acute osseous abnormality. IMPRESSION: 1. New small opacity in the left mid lung could reflect developing pneumonia given clinical history. Followup PA and lateral chest X-ray is recommended in 3-4 weeks following trial of antibiotic therapy to ensure resolution. Electronically Signed   By: Titus Dubin M.D.   On: 03/18/2019 19:18     Phillips Climes M.D on 03/25/2019 at 1:06 PM   Between 7am to 7pm - Pager - [phone]  After 7pm go to [email or web address] - password [facility]  [facility] -  Office  [phone]

## 2019-03-25 NOTE — Evaluation (Signed)
Physical Therapy Evaluation Patient Details Name: Kayla Roberts MRN: [id number] DOB: Jun 28, 1956 Today's Date: 03/25/2019   History of Present Illness  63 y.o. female with past medical history of hypertension, hyperlipidemia, morbid obesity, GERD, depression, anxiety, asthma. admitted to [facility] with pna d/t covid 19  Clinical Impression      Follow Up Recommendations Home health PT(vs no f/u)    Equipment Recommendations  Rolling walker with 5" wheels;3in1 (PT)    Recommendations for Other Services   Pt admitted with above diagnosis. Pt currently with functional limitations due to the deficits listed below (see PT Problem List).  Pt is motivated and cooperative, fatigues rapidly with significantly incr  WOB, rapid descent after second amb distance (per pt request, cautioned pt that distance may be too taxing) VS at rest and with exertion as follows: HR 83-- 88--90  SpO2= 93% --88%--95% (3-4/4 DOE) on 4-5L throughout  RR 24--38--20s  Pt will benefit from skilled PT to increase their independence and safety with mobility to allow discharge to the venue listed below.       Precautions / Restrictions Precautions Precaution Comments: monitor VS Restrictions Weight Bearing Restrictions: No      Mobility  Bed Mobility               General bed mobility comments: pt in chair on arrival  Transfers Overall transfer level: Needs assistance Equipment used: Rolling walker (2 wheeled) Transfers: Sit to/from Stand Sit to Stand: Min assist;Min guard         General transfer comment: cues for hand placement. incr time needed  Ambulation/Gait Ambulation/Gait assistance: Min assist Gait Distance (Feet): 25 Feet(12' ) Assistive device: Rolling walker (2 wheeled) Gait Pattern/deviations: Step-through pattern;Decreased stride length;Wide base of support     General Gait Details: incr time, cues for trunk extension, RW position, breathing  Stairs            Wheelchair  Mobility    Modified Rankin (Stroke Patients Only)       Balance Overall balance assessment: Needs assistance   Sitting balance-Leahy Scale: Good       Standing balance-Leahy Scale: Fair Standing balance comment: reliant on UEs for dynamic balance                             Pertinent Vitals/Pain Pain Assessment: No/denies pain    Home Living Family/patient expects to be discharged to:: Private residence Living Arrangements: Children Available Help at Discharge: Family Type of Home: House       Home Layout: One level Home Equipment: None      Prior Function Level of Independence: Independent         Comments: lives with dtrs, one dtr works at [employer] at [employer]        Extremity/Trunk Assessment   Upper Extremity Assessment Upper Extremity Assessment: Overall WFL for tasks assessed    Lower Extremity Assessment Lower Extremity Assessment: Overall WFL for tasks assessed( rapid muscle fatigue)       Communication   Communication: No difficulties  Cognition Arousal/Alertness: Awake/alert Behavior During Therapy: WFL for tasks assessed/performed Overall Cognitive Status: Within Functional Limits for tasks assessed                                        General Comments General comments (skin integrity, edema,  etc.):     Exercises     Assessment/Plan    PT Assessment Patient needs continued PT services  PT Problem List Decreased strength;Decreased mobility;Decreased activity tolerance;Decreased knowledge of use of DME;Cardiopulmonary status limiting activity       PT Treatment Interventions DME instruction;Gait training;Functional mobility training;Therapeutic activities;Patient/family education;Therapeutic exercise    PT Goals (Current goals can be found in the Care Plan section)  Acute Rehab PT Goals Patient Stated Goal: home soon PT Goal Formulation: With patient Time For Goal Achievement:  04/08/19 Potential to Achieve Goals: Good    Frequency Min 4X/week   Barriers to discharge        Co-evaluation               AM-PAC PT "6 Clicks" Mobility  Outcome Measure Help needed turning from your back to your side while in a flat bed without using bedrails?: A Little Help needed moving from lying on your back to sitting on the side of a flat bed without using bedrails?: A Little Help needed moving to and from a bed to a chair (including a wheelchair)?: A Little Help needed standing up from a chair using your arms (e.g., wheelchair or bedside chair)?: A Little Help needed to walk in hospital room?: A Lot Help needed climbing 3-5 steps with a railing? : A Lot 6 Click Score: 16    End of Session   Activity Tolerance: Patient limited by fatigue Patient left: in chair;with call bell/phone within reach   PT Visit Diagnosis: Unsteadiness on feet (R26.81)    Time: 5997-7414 PT Time Calculation (min) (ACUTE ONLY): 27 min   Charges:   PT Evaluation $PT Eval Moderate Complexity: 1 Mod PT Treatments $Gait Training: 8-22 mins        Kenyon Ana, PT  Pager: [phone] Acute Rehab Dept [facility]): [phone]   03/25/2019   [facility] 03/25/2019, 4:09 PM

## 2019-03-26 DIAGNOSIS — J189 Pneumonia, unspecified organism: Secondary | ICD-10-CM

## 2019-03-26 DIAGNOSIS — J9621 Acute and chronic respiratory failure with hypoxia: Secondary | ICD-10-CM

## 2019-03-26 LAB — CBC
HCT: 32.2 % — ABNORMAL LOW (ref 36.0–46.0)
Hemoglobin: 10.4 g/dL — ABNORMAL LOW (ref 12.0–15.0)
MCH: 26.3 pg (ref 26.0–34.0)
MCHC: 32.3 g/dL (ref 30.0–36.0)
MCV: 81.5 fL (ref 80.0–100.0)
Platelets: 403 10*3/uL — ABNORMAL HIGH (ref 150–400)
RBC: 3.95 MIL/uL (ref 3.87–5.11)
RDW: 13.3 % (ref 11.5–15.5)
WBC: 7.4 10*3/uL (ref 4.0–10.5)
nRBC: 0 % (ref 0.0–0.2)

## 2019-03-26 LAB — GLUCOSE, CAPILLARY
Glucose-Capillary: 319 mg/dL — ABNORMAL HIGH (ref 70–99)
Glucose-Capillary: 384 mg/dL — ABNORMAL HIGH (ref 70–99)
Glucose-Capillary: 318 mg/dL — ABNORMAL HIGH (ref 70–99)
Glucose-Capillary: 334 mg/dL — ABNORMAL HIGH (ref 70–99)

## 2019-03-26 LAB — COMPREHENSIVE METABOLIC PANEL
ALT: 18 U/L (ref 0–44)
AST: 18 U/L (ref 15–41)
Albumin: 3.2 g/dL — ABNORMAL LOW (ref 3.5–5.0)
Alkaline Phosphatase: 101 U/L (ref 38–126)
Anion gap: 12 (ref 5–15)
BUN: 47 mg/dL — ABNORMAL HIGH (ref 8–23)
CO2: 28 mmol/L (ref 22–32)
Calcium: 9.1 mg/dL (ref 8.9–10.3)
Chloride: 97 mmol/L — ABNORMAL LOW (ref 98–111)
Creatinine, Ser: 1.28 mg/dL — ABNORMAL HIGH (ref 0.44–1.00)
GFR calc Af Amer: 51 mL/min — ABNORMAL LOW (ref >60)
GFR calc non Af Amer: 44 mL/min — ABNORMAL LOW (ref >60)
Glucose, Bld: 219 mg/dL — ABNORMAL HIGH (ref 70–99)
Potassium: 4.4 mmol/L (ref 3.5–5.1)
Sodium: 137 mmol/L (ref 135–145)
Total Bilirubin: 0.5 mg/dL (ref 0.3–1.2)
Total Protein: 7.4 g/dL (ref 6.5–8.1)

## 2019-03-26 LAB — FERRITIN: Ferritin: 187 ng/mL (ref 11–307)

## 2019-03-26 LAB — D-DIMER, QUANTITATIVE: D-Dimer, Quant: 1.37 ug/mL-FEU — ABNORMAL HIGH (ref 0.00–0.50)

## 2019-03-26 LAB — C-REACTIVE PROTEIN: CRP: 14.4 mg/dL — ABNORMAL HIGH (ref <1.0)

## 2019-03-26 MED ORDER — INSULIN GLARGINE 100 UNIT/ML ~~LOC~~ SOLN
8.0000 [IU] | Freq: Every day | SUBCUTANEOUS | Status: DC
  Administered 2019-03-26 – 2019-03-27 (×2): 8 [IU] via SUBCUTANEOUS
  Filled 2019-03-26 (×2): qty 0.08

## 2019-03-26 MED ORDER — INSULIN ASPART 100 UNIT/ML ~~LOC~~ SOLN
0.0000 [IU] | Freq: Every day | SUBCUTANEOUS | Status: DC
  Administered 2019-03-26: 22:00:00 4 [IU] via SUBCUTANEOUS
  Administered 2019-03-27: 21:00:00 3 [IU] via SUBCUTANEOUS
  Administered 2019-03-28: 5 [IU] via SUBCUTANEOUS
  Administered 2019-03-29: 4 [IU] via SUBCUTANEOUS
  Administered 2019-03-30: 2 [IU] via SUBCUTANEOUS
  Administered 2019-03-31: 3 [IU] via SUBCUTANEOUS

## 2019-03-26 MED ORDER — METHYLPREDNISOLONE SODIUM SUCC 125 MG IJ SOLR: 40.0000 mg | Freq: Two times a day (BID) | INTRAMUSCULAR | Status: DC

## 2019-03-26 MED ORDER — METHYLPREDNISOLONE SODIUM SUCC 125 MG IJ SOLR
40.0000 mg | Freq: Two times a day (BID) | INTRAMUSCULAR | Status: DC
  Administered 2019-03-26 – 2019-03-29 (×6): 40 mg via INTRAVENOUS
  Filled 2019-03-26 (×8): qty 2

## 2019-03-26 MED ORDER — INSULIN ASPART 100 UNIT/ML ~~LOC~~ SOLN
0.0000 [IU] | Freq: Three times a day (TID) | SUBCUTANEOUS | Status: DC
  Administered 2019-03-26: 15 [IU] via SUBCUTANEOUS
  Administered 2019-03-27: 09:00:00 11 [IU] via SUBCUTANEOUS
  Administered 2019-03-27: 12:00:00 15 [IU] via SUBCUTANEOUS
  Administered 2019-03-27: 20 [IU] via SUBCUTANEOUS
  Administered 2019-03-28 (×2): 4 [IU] via SUBCUTANEOUS
  Administered 2019-03-28: 11 [IU] via SUBCUTANEOUS
  Administered 2019-03-29: 20 [IU] via SUBCUTANEOUS
  Administered 2019-03-29: 7 [IU] via SUBCUTANEOUS
  Administered 2019-03-29 – 2019-03-30 (×2): 15 [IU] via SUBCUTANEOUS
  Administered 2019-03-30: 12:00:00 11 [IU] via SUBCUTANEOUS
  Administered 2019-03-30: 4 [IU] via SUBCUTANEOUS
  Administered 2019-03-31 (×2): 7 [IU] via SUBCUTANEOUS
  Administered 2019-03-31: 20 [IU] via SUBCUTANEOUS
  Administered 2019-04-01: 15 [IU] via SUBCUTANEOUS
  Administered 2019-04-02: 3 [IU] via SUBCUTANEOUS
  Administered 2019-04-02: 11 [IU] via SUBCUTANEOUS
  Administered 2019-04-02: 4 [IU] via SUBCUTANEOUS
  Administered 2019-04-03: 3 [IU] via SUBCUTANEOUS

## 2019-03-26 MED ORDER — FUROSEMIDE 10 MG/ML IJ SOLN
60.0000 mg | Freq: Every day | INTRAMUSCULAR | Status: DC
  Administered 2019-03-27 – 2019-03-30 (×4): 60 mg via INTRAVENOUS
  Filled 2019-03-26 (×4): qty 6

## 2019-03-26 NOTE — Progress Notes (Signed)
Physical Therapy Treatment Patient Details Name: Kayla Roberts MRN: [id number] DOB: [birth year] Today's Date: 03/26/2019    History of Present Illness 63 y.o. female with past medical history of hypertension, hyperlipidemia, morbid obesity, GERD, depression, anxiety, asthma. admitted to [facility] with pna d/t covid 19    PT Comments    Pt making excellent progress today; overall feeling better; amb in hallway with RW, tolerated well; encouraged continued mobility today, amb short distance  to [facility], etc. Will continue to follow, feel pt will likely be able to d/c home without PT f/u, will educate on HEP, incr mobility at home VS rest/exertional/rest: HR 100--125--98% SpO2= 90%--86%--91% (4 to 5L throughout, resting at 3.5L low 90s, with extension had to incr to 4l) RR 20--33--21  Follow Up Recommendations  No PT follow up     Equipment Recommendations  Rolling walker with 5" wheels;3in1 (PT)    Recommendations for Other Services       Precautions / Restrictions Precautions Precautions: Other (comment) Precaution Comments: monitor VS Restrictions Weight Bearing Restrictions: No    Mobility  Bed Mobility               General bed mobility comments: pt in chair on arrival  Transfers Overall transfer level: Needs assistance Equipment used: Rolling walker (2 wheeled) Transfers: Sit to/from Stand Sit to Stand: Supervision         General transfer comment: cues for hand placement  Ambulation/Gait Ambulation/Gait assistance: Min guard Gait Distance (Feet): 280 Feet Assistive device: Rolling walker (2 wheeled) Gait Pattern/deviations: Step-through pattern;Decreased stride length;Wide base of support     General Gait Details: cues for trunk extension, RW position, breathing; stand recovery/rest breaks x3   Stairs             Wheelchair Mobility    Modified Rankin (Stroke Patients Only)       Balance     Sitting balance-Leahy Scale: Good        Standing balance-Leahy Scale: Fair                              Cognition Arousal/Alertness: Awake/alert Behavior During Therapy: WFL for tasks assessed/performed Overall Cognitive Status: Within Functional Limits for tasks assessed                                 General Comments: pt pleasant and cooperative      Exercises      General Comments        Pertinent Vitals/Pain Pain Assessment: No/denies pain    Home Living                      Prior Function            PT Goals (current goals can now be found in the care plan section) Acute Rehab PT Goals Patient Stated Goal: home soon PT Goal Formulation: With patient Time For Goal Achievement: 04/08/19 Potential to Achieve Goals: Good Progress towards PT goals: Progressing toward goals    Frequency    Min 4X/week      PT Plan Current plan remains appropriate    Co-evaluation              AM-PAC PT "6 Clicks" Mobility   Outcome Measure  Help needed turning from your back to your side while in a flat bed without using bedrails?: A  Little Help needed moving from lying on your back to sitting on the side of a flat bed without using bedrails?: A Little Help needed moving to and from a bed to a chair (including a wheelchair)?: A Little Help needed standing up from a chair using your arms (e.g., wheelchair or bedside chair)?: A Little Help needed to walk in hospital room?: A Little Help needed climbing 3-5 steps with a railing? : A Little 6 Click Score: 18    End of Session   Activity Tolerance: Patient tolerated treatment well Patient left: in chair;with call bell/phone within reach   PT Visit Diagnosis: Unsteadiness on feet (R26.81)     Time: 4332-9518 PT Time Calculation (min) (ACUTE ONLY): 30 min  Charges:  $Gait Training: 23-37 mins                     Kenyon Ana, PT  Pager: [phone] Acute Rehab Dept [facility]):  [phone]   03/26/2019    [facility] 03/26/2019, 12:24 PM

## 2019-03-26 NOTE — Progress Notes (Signed)
PROGRESS NOTE                                                                                                                                                                                                             Patient Demographics:    Kayla Roberts, is a 63 y.o. female, DOB - Feb 05, 1956, SHF:[id number]  Admit date - 03/23/2019   Admitting Physician Terrilee Croak, MD  Outpatient Primary MD for the patient is Velna Ochs, MD  LOS - 3   Chief Complaint  Patient presents with  . Shortness of Breath  . Pneumonia       Brief Narrative     63 y.o. female with past medical history of hypertension, hyperlipidemia, morbid obesity, GERD, depression, anxiety, asthma, presented from home with complaint of worsening shortness of breath, fever, cough and lethargy for last 1 week. In ED, patient was saturating 90% on 2 L nasal cannula, chest x-ray with worsening multilobar pneumonia, COVID-19 test came back positive.   Subjective:   Patient states that she is feeling slightly better.  Shortness of breath has improved.  Still has a cough with clear expectoration.  Complains of some pain in the upper back especially when she coughs.    Assessment  & Plan :    Active Problems:   Pneumonia due to COVID-19 virus   Acute on chronic respiratory failure with hypoxia (HCC)   Hyperglycemia  Acute  hypoxic respiratory failure/pneumonia due to COVID-19 infection -Patient continues to have high oxygen requirements.  Currently at 4-1/2 L by nasal cannula.  Prone position as much as possible.  Incentive spirometry.   -Continue Combivent.   -CRP was 20.2--23.6--14.4  -D-dimer 1.84--1.63--1.37 Patient has not been given any Actemra yet.  Patient has been given Solu-Medrol.  She remains on the same. Patient also being diuresed with furosemide.  Rise in creatinine noted.  We will cut back on the dose.  History of hypertension Blood pressure is reasonably  well controlled although some lower levels have been noted.  We will continue with amlodipine.  Hold hydrochlorothiazide and lisinopril.  History of hyperlipidemia -Prior to admission, continue with home meds aspirin and statin .  Hyperglycemia Most likely due to steroids.  Patient was started on SSI.  Will need Lantus as well.  Morbid obesity  - Body mass index is 48.67 kg/m.  Hypokalemia Monitor closely while patient is getting diuresed.  Normocytic anemia Likely dilutional.  No evidence of blood loss.  Monitor closely.   Code Status : Full  Family Communication  : Will discuss with family today  Disposition Plan  : Unclear  Barriers For Discharge : Quiring oxygen by nasal cannula.  Remains on IV Lasix.  Consults  :  None  Procedures  : None  DVT Prophylaxis  :  [place] lovenox  Lab Results  Component Value Date   PLT 403 (H) 03/26/2019    Antibiotics  :    Anti-infectives (From admission, onward)   None        Objective:   Vitals:   03/26/19 0500 03/26/19 0800 03/26/19 0900 03/26/19 1000  BP:  100/89 (!) 112/48 (!) 110/53  Pulse:  97 92 92  Resp:  (!) 23 (!) 26 19  Temp: 98.6 F (37 C) 98.3 F (36.8 C)    TempSrc: Oral Oral    SpO2:  95% 93% 94%  Weight:      Height:        Wt Readings from Last 3 Encounters:  03/23/19 (!) 142 kg  03/18/19 (!) 145.2 kg  01/18/19 (!) 151.5 kg     Intake/Output Summary (Last 24 hours) at 03/26/2019 1154 Last data filed at 03/26/2019 0430 Gross per 24 hour  Intake 640 ml  Output 1900 ml  Net -1260 ml     Physical Exam  General appearance: Awake alert.  In no distress.  Morbidly obese Resp: Mildly tachypneic.  Coarse breath sounds bilaterally.  Diminished at the bases.  No wheezing or rhonchi. Cardio: S1-S2 is normal regular.  No S3-S4.  No rubs murmurs or bruit GI: Abdomen is soft.  Nontender nondistended.  Bowel sounds are present normal.  No masses organomegaly Extremities: No edema.  Full range of  motion of lower extremities. Back: Mildly tender in the right upper back.  No lesions noted. Neurologic: Alert and oriented x3.  No focal neurological deficits.       Data Review:    CBC Recent Labs  Lab 03/23/19 1303 03/24/19 0855 03/25/19 0505 03/26/19 0455  WBC 9.2 6.2 4.7 7.4  HGB 10.6* 10.8* 10.5* 10.4*  HCT 31.9* 32.8* 32.3* 32.2*  PLT 263 300 334 403*  MCV 82.9 82.0 82.2 81.5  MCH 27.5 27.0 26.7 26.3  MCHC 33.2 32.9 32.5 32.3  RDW 13.6 13.3 13.3 13.3  LYMPHSABS 0.7  --   --   --   MONOABS 0.4  --   --   --   EOSABS 0.0  --   --   --   BASOSABS 0.0  --   --   --     Chemistries  Recent Labs  Lab 03/23/19 1303 03/24/19 0855 03/25/19 0505 03/26/19 0455  NA 136 137 137 137  K 3.2* 3.1* 4.7 4.4  CL 97* 94* 97* 97*  CO2 29 32 30 28  GLUCOSE 138* 141* 250* 219*  BUN 24* 19 30* 47*  CREATININE 0.82 0.79 1.06* 1.28*  CALCIUM 8.9 9.2 8.9 9.1  MG  --   --  1.9  --   AST 26  --   --  18  ALT 23  --   --  18  ALKPHOS 104  --   --  101  BILITOT 0.7  --   --  0.5    Recent Labs    03/25/19 0505 03/26/19 0455  FERRITIN 196 187  Recent Labs    03/25/19 0505 03/26/19 0455  DDIMER 1.63* 1.37*    Cardiac Enzymes Recent Labs  Lab 03/23/19 1303  TROPONINI <0.03   ------------------------------------------------------------------------------------------------------------------    Component Value Date/Time   BNP 38.2 03/23/2019 1303    Inpatient Medications  Scheduled Meds: . amLODipine  5 mg Oral Daily  . aspirin EC  81 mg Oral Daily  . atorvastatin  20 mg Oral q1800  . beclomethasone  2 puff Inhalation BID  . enoxaparin (LOVENOX) injection  40 mg Subcutaneous Q12H  . furosemide  60 mg Intravenous BID  . guaiFENesin  1,200 mg Oral BID  . hydrochlorothiazide  25 mg Oral Daily  . insulin aspart  0-9 Units Subcutaneous TID WC  . Ipratropium-Albuterol  1 puff Inhalation QID  . lisinopril  20 mg Oral Daily  . methylPREDNISolone (SOLU-MEDROL)  injection  40 mg Intravenous Q8H  . pantoprazole  40 mg Oral Daily  . senna  1 tablet Oral BID  . vitamin C  500 mg Oral Daily  . zinc sulfate  220 mg Oral Daily   Continuous Infusions: PRN Meds:.acetaminophen **OR** acetaminophen, albuterol, chlorpheniramine-HYDROcodone, guaiFENesin-codeine, magnesium hydroxide, ondansetron **OR** ondansetron (ZOFRAN) IV, sodium chloride, sodium phosphate, sorbitol  Micro Results Recent Results (from the past 240 hour(s))  SARS Coronavirus 2 [facility] order, Performed in [place] hospital lab)     Status: Abnormal   Collection Time: 03/23/19  1:03 PM  Result Value Ref Range Status   SARS Coronavirus 2 POSITIVE (A) NEGATIVE Final    Comment: RESULT CALLED TO, READ BACK BY AND VERIFIED WITH: P DOWD,RN 03/23/19 1457 RHOLMES (NOTE) If result is NEGATIVE SARS-CoV-2 target nucleic acids are NOT DETECTED. The SARS-CoV-2 RNA is generally detectable in upper and lower  respiratory specimens during the acute phase of infection. The lowest  concentration of SARS-CoV-2 viral copies this assay can detect is 250  copies / mL. A negative result does not preclude SARS-CoV-2 infection  and should not be used as the sole basis for treatment or other  patient management decisions.  A negative result may occur with  improper specimen collection / handling, submission of specimen other  than nasopharyngeal swab, presence of viral mutation(s) within the  areas targeted by this assay, and inadequate number of viral copies  (<250 copies / mL). A negative result must be combined with clinical  observations, patient history, and epidemiological information. If result is POSITIVE SARS-CoV-2 target nucleic acids are DETECTED. The SA RS-CoV-2 RNA is generally detectable in upper and lower  respiratory specimens during the acute phase of infection.  Positive  results are indicative of active infection with SARS-CoV-2.  Clinical  correlation with patient history and other  diagnostic information is  necessary to determine patient infection status.  Positive results do  not rule out bacterial infection or co-infection with other viruses. If result is PRESUMPTIVE POSTIVE SARS-CoV-2 nucleic acids MAY BE PRESENT.   A presumptive positive result was obtained on the submitted specimen  and confirmed on repeat testing.  While 2019 novel coronavirus  (SARS-CoV-2) nucleic acids may be present in the submitted sample  additional confirmatory testing may be necessary for epidemiological  and / or clinical management purposes  to differentiate between  SARS-CoV-2 and other Sarbecovirus currently known to infect humans.  If clinically indicated additional testing with an alternate test  methodology [phone]) is advi sed. The SARS-CoV-2 RNA is generally  detectable in upper and lower respiratory specimens during the acute  phase of infection.  The expected result is Negative. Fact Sheet for Patients:  StrictlyIdeas.no Fact Sheet for Healthcare Providers: BankingDealers.co.za This test is not yet approved or cleared by the [place] FDA and has been authorized for detection and/or diagnosis of SARS-CoV-2 by FDA under an Emergency Use Authorization (EUA).  This EUA will remain in effect (meaning this test can be used) for the duration of the COVID-19 declaration under Section 564(b)(1) of the Act, 21 U.S.C. section 360bbb-3(b)(1), unless the authorization is terminated or revoked sooner. Performed at [facility], [place] [address], [place], [place] [address]     Radiology Reports Dg Chest Port 1 View  Result Date: 03/25/2019 CLINICAL DATA:  Hypoxia. EXAM: PORTABLE CHEST 1 VIEW COMPARISON:  None. FINDINGS: The heart size is normal. Bilateral interstitial and airspace disease is present. Lung volumes are low. IMPRESSION: 1. Low lung volumes with multi lobar interstitial and airspace disease concerning  for infection/multi lobar pneumonia. Electronically Signed   By: San Morelle M.D.   On: 03/25/2019 10:10   Dg Chest Port 1 View  Result Date: 03/23/2019 CLINICAL DATA:  63 year old female with history of pneumonia. Worsening shortness of breath and productive cough. EXAM: PORTABLE CHEST 1 VIEW COMPARISON:  Chest x-ray 03/18/2019. FINDINGS: Patchy multifocal ill-defined airspace disease throughout the lungs bilaterally, significantly increased compared to the prior examination, compatible with progressive multilobar pneumonia. No pleural effusions. No evidence of pulmonary edema. Heart size is mildly enlarged. The patient is rotated to the right on today's exam, resulting in distortion of the mediastinal contours and reduced diagnostic sensitivity and specificity for mediastinal pathology. IMPRESSION: 1. Worsening bilateral multilobar pneumonia, concerning for potential viral infection. Electronically Signed   By: Vinnie Langton M.D.   On: 03/23/2019 14:08   Dg Chest Portable 1 View  Result Date: 03/18/2019 CLINICAL DATA:  Cough and shortness of breath for the past 5 days. New onset fever today. EXAM: PORTABLE CHEST 1 VIEW COMPARISON:  Chest x-ray dated March 08, 2018. FINDINGS: Stable mild cardiomegaly. Normal mediastinal contours. Normal pulmonary vascularity. New small opacity in the left mid lung. Minimal bibasilar atelectasis. No focal consolidation, pleural effusion, or pneumothorax. Unchanged pleural thickening at the bilateral costophrenic angles. No acute osseous abnormality. IMPRESSION: 1. New small opacity in the left mid lung could reflect developing pneumonia given clinical history. Followup PA and lateral chest X-ray is recommended in 3-4 weeks following trial of antibiotic therapy to ensure resolution. Electronically Signed   By: Titus Dubin M.D.   On: 03/18/2019 19:18     Bonnielee Haff M.D on 03/26/2019 at 11:54 AM  Pager: [email or web address] - password [facility]  [facility] -  Office  [phone]

## 2019-03-26 NOTE — Plan of Care (Signed)
  Problem: Education: Goal: Knowledge of General Education information will improve Description Including pain rating scale, medication(s)/side effects and non-pharmacologic comfort measures Outcome: Progressing   Problem: Health Behavior/Discharge Planning: Goal: Ability to manage health-related needs will improve Outcome: Progressing   Problem: Clinical Measurements: Goal: Ability to maintain clinical measurements within normal limits will improve Outcome: Progressing Goal: Will remain free from infection Outcome: Progressing Goal: Diagnostic test results will improve Outcome: Progressing Goal: Respiratory complications will improve Outcome: Progressing Goal: Cardiovascular complication will be avoided Outcome: Progressing   Problem: Activity: Goal: Risk for activity intolerance will decrease Outcome: Progressing   Problem: Nutrition: Goal: Adequate nutrition will be maintained Outcome: Progressing   Problem: Coping: Goal: Level of anxiety will decrease Outcome: Progressing   Problem: Elimination: Goal: Will not experience complications related to bowel motility Outcome: Progressing Goal: Will not experience complications related to urinary retention Outcome: Progressing   Problem: Pain Managment: Goal: General experience of comfort will improve Outcome: Progressing   Problem: Safety: Goal: Ability to remain free from injury will improve Outcome: Progressing   Problem: Skin Integrity: Goal: Risk for impaired skin integrity will decrease Outcome: Progressing   Problem: Education: Goal: Knowledge of General Education information will improve Description Including pain rating scale, medication(s)/side effects and non-pharmacologic comfort measures Outcome: Progressing   Problem: Health Behavior/Discharge Planning: Goal: Ability to manage health-related needs will improve Outcome: Progressing   Problem: Clinical Measurements: Goal: Ability to maintain  clinical measurements within normal limits will improve Outcome: Progressing Goal: Will remain free from infection Outcome: Progressing Goal: Diagnostic test results will improve Outcome: Progressing Goal: Respiratory complications will improve Outcome: Progressing Goal: Cardiovascular complication will be avoided Outcome: Progressing   Problem: Activity: Goal: Risk for activity intolerance will decrease Outcome: Progressing   Problem: Nutrition: Goal: Adequate nutrition will be maintained Outcome: Progressing   Problem: Coping: Goal: Level of anxiety will decrease Outcome: Progressing   Problem: Elimination: Goal: Will not experience complications related to bowel motility Outcome: Progressing Goal: Will not experience complications related to urinary retention Outcome: Progressing   Problem: Pain Managment: Goal: General experience of comfort will improve Outcome: Progressing   Problem: Safety: Goal: Ability to remain free from injury will improve Outcome: Progressing   Problem: Skin Integrity: Goal: Risk for impaired skin integrity will decrease Outcome: Progressing   Problem: Education: Goal: Knowledge of risk factors and measures for prevention of condition will improve Outcome: Progressing   Problem: Respiratory: Goal: Will maintain a patent airway Outcome: Progressing Goal: Complications related to the disease process, condition or treatment will be avoided or minimized Outcome: Progressing

## 2019-03-27 DIAGNOSIS — R062 Wheezing: Secondary | ICD-10-CM

## 2019-03-27 LAB — COMPREHENSIVE METABOLIC PANEL
ALT: 18 U/L (ref 0–44)
AST: 16 U/L (ref 15–41)
Albumin: 3.3 g/dL — ABNORMAL LOW (ref 3.5–5.0)
Alkaline Phosphatase: 97 U/L (ref 38–126)
Anion gap: 10 (ref 5–15)
BUN: 54 mg/dL — ABNORMAL HIGH (ref 8–23)
CO2: 30 mmol/L (ref 22–32)
Calcium: 9.2 mg/dL (ref 8.9–10.3)
Chloride: 98 mmol/L (ref 98–111)
Creatinine, Ser: 1.15 mg/dL — ABNORMAL HIGH (ref 0.44–1.00)
GFR calc Af Amer: 58 mL/min — ABNORMAL LOW (ref >60)
GFR calc non Af Amer: 50 mL/min — ABNORMAL LOW (ref >60)
Glucose, Bld: 205 mg/dL — ABNORMAL HIGH (ref 70–99)
Potassium: 4 mmol/L (ref 3.5–5.1)
Sodium: 138 mmol/L (ref 135–145)
Total Bilirubin: 0.4 mg/dL (ref 0.3–1.2)
Total Protein: 7.4 g/dL (ref 6.5–8.1)

## 2019-03-27 LAB — CBC
HCT: 31.1 % — ABNORMAL LOW (ref 36.0–46.0)
Hemoglobin: 10.6 g/dL — ABNORMAL LOW (ref 12.0–15.0)
MCH: 27.7 pg (ref 26.0–34.0)
MCHC: 34.1 g/dL (ref 30.0–36.0)
MCV: 81.2 fL (ref 80.0–100.0)
Platelets: 421 10*3/uL — ABNORMAL HIGH (ref 150–400)
RBC: 3.83 MIL/uL — ABNORMAL LOW (ref 3.87–5.11)
RDW: 13.3 % (ref 11.5–15.5)
WBC: 10.2 10*3/uL (ref 4.0–10.5)
nRBC: 0 % (ref 0.0–0.2)

## 2019-03-27 LAB — C-REACTIVE PROTEIN: CRP: 8.3 mg/dL — ABNORMAL HIGH (ref <1.0)

## 2019-03-27 LAB — GLUCOSE, CAPILLARY
Glucose-Capillary: 217 mg/dL — ABNORMAL HIGH (ref 70–99)
Glucose-Capillary: 268 mg/dL — ABNORMAL HIGH (ref 70–99)
Glucose-Capillary: 295 mg/dL — ABNORMAL HIGH (ref 70–99)
Glucose-Capillary: 349 mg/dL — ABNORMAL HIGH (ref 70–99)
Glucose-Capillary: 390 mg/dL — ABNORMAL HIGH (ref 70–99)

## 2019-03-27 LAB — FERRITIN: Ferritin: 173 ng/mL (ref 11–307)

## 2019-03-27 LAB — INTERLEUKIN-6, PLASMA: Interleukin-6, Plasma: 2.2 pg/mL (ref 0.0–12.2)

## 2019-03-27 LAB — HEMOGLOBIN A1C
Hgb A1c MFr Bld: 7 % — ABNORMAL HIGH (ref 4.8–5.6)
Mean Plasma Glucose: 154.2 mg/dL

## 2019-03-27 LAB — D-DIMER, QUANTITATIVE: D-Dimer, Quant: 1.22 ug/mL-FEU — ABNORMAL HIGH (ref 0.00–0.50)

## 2019-03-27 MED ORDER — INSULIN GLARGINE 100 UNIT/ML ~~LOC~~ SOLN
15.0000 [IU] | Freq: Every day | SUBCUTANEOUS | Status: DC
  Administered 2019-03-28 – 2019-04-03 (×7): 15 [IU] via SUBCUTANEOUS
  Filled 2019-03-27 (×7): qty 0.15

## 2019-03-27 MED ORDER — IPRATROPIUM-ALBUTEROL 20-100 MCG/ACT IN AERS
2.0000 | INHALATION_SPRAY | Freq: Four times a day (QID) | RESPIRATORY_TRACT | Status: DC
  Administered 2019-03-27 – 2019-03-30 (×15): 2 via RESPIRATORY_TRACT
  Filled 2019-03-27: qty 4

## 2019-03-27 MED ORDER — INSULIN GLARGINE 100 UNIT/ML ~~LOC~~ SOLN
8.0000 [IU] | Freq: Once | SUBCUTANEOUS | Status: AC
  Administered 2019-03-27: 8 [IU] via SUBCUTANEOUS
  Filled 2019-03-27: qty 0.08

## 2019-03-27 MED ORDER — AZITHROMYCIN 250 MG PO TABS
500.0000 mg | ORAL_TABLET | Freq: Every day | ORAL | Status: AC
  Administered 2019-03-27 – 2019-03-31 (×5): 500 mg via ORAL
  Filled 2019-03-27 (×5): qty 2

## 2019-03-27 MED ORDER — LIVING WELL WITH DIABETES BOOK
Freq: Once | Status: AC
  Administered 2019-03-27: 17:00:00

## 2019-03-27 NOTE — Progress Notes (Signed)
Physical Therapy Treatment Patient Details Name: Kayla Roberts MRN: [id number] DOB: 05/22/1956 Today's Date: 03/27/2019    History of Present Illness 63 y.o. female with past medical history of hypertension, hyperlipidemia, morbid obesity, GERD, depression, anxiety, asthma. admitted to [facility] with pna d/t covid 19    PT Comments    The patient ambualted x 320' on 4 L [place]. Sats 91%. HR 109. Patient very willing to mobilize/ambualte. Continue mobility.    Follow Up Recommendations  No PT follow up     Equipment Recommendations  Rolling walker with 5" wheels;3in1 (PT)    Recommendations for Other Services       Precautions / Restrictions Precautions Precaution Comments: monitor VS    Mobility  Bed Mobility               General bed mobility comments: pt in chair on arrival  Transfers   Equipment used: Rolling walker (2 wheeled) Transfers: Sit to/from Stand Sit to Stand: Supervision            Ambulation/Gait Ambulation/Gait assistance: Min guard Gait Distance (Feet): 320 Feet Assistive device: Rolling walker (2 wheeled) Gait Pattern/deviations: Step-through pattern     General Gait Details: stopped to stand and rest on RW after 180'. stood at window x 5 minutes. sats 91% on 4 L. HR 109   Stairs             Wheelchair Mobility    Modified Rankin (Stroke Patients Only)       Balance                                            Cognition Arousal/Alertness: Awake/alert                                            Exercises      General Comments        Pertinent Vitals/Pain Pain Assessment: No/denies pain    Home Living                      Prior Function            PT Goals (current goals can now be found in the care plan section) Progress towards PT goals: Progressing toward goals    Frequency    Min 4X/week      PT Plan Current plan remains appropriate     Co-evaluation              AM-PAC PT "6 Clicks" Mobility   Outcome Measure  Help needed turning from your back to your side while in a flat bed without using bedrails?: A Little Help needed moving from lying on your back to sitting on the side of a flat bed without using bedrails?: A Little Help needed moving to and from a bed to a chair (including a wheelchair)?: A Little Help needed standing up from a chair using your arms (e.g., wheelchair or bedside chair)?: A Little Help needed to walk in hospital room?: A Little Help needed climbing 3-5 steps with a railing? : A Little 6 Click Score: 18    End of Session Equipment Utilized During Treatment: Oxygen Activity Tolerance: Patient tolerated treatment well Patient left: in chair;with call bell/phone within  reach;with nursing/sitter in room Nurse Communication: Mobility status PT Visit Diagnosis: Unsteadiness on feet (R26.81)     Time: 1655-3748 PT Time Calculation (min) (ACUTE ONLY): 26 min  Charges:  $Gait Training: 23-37 mins                     [place] Pager [phone] Office [phone]    Claretha Cooper 03/27/2019, 4:57 PM

## 2019-03-27 NOTE — Progress Notes (Signed)
Pt had a great night. She's asking for a shower and so happy at how she's progressing. She is a sweet patient.

## 2019-03-27 NOTE — Plan of Care (Signed)
  Problem: Education: Goal: Knowledge of General Education information will improve Description: Including pain rating scale, medication(s)/side effects and non-pharmacologic comfort measures Outcome: Progressing   Problem: Clinical Measurements: Goal: Ability to maintain clinical measurements within normal limits will improve Outcome: Progressing   

## 2019-03-27 NOTE — Progress Notes (Signed)
PROGRESS NOTE  Kayla Roberts TDV:[id number] DOB: 1956/03/08 DOA: 03/23/2019  PCP: Velna Ochs, MD  Brief History/Interval Summary:  63 y.o.femalewith past medical history of hypertension, hyperlipidemia, morbid obesity, GERD, depression, anxiety, asthma, presented from home with complaint of worsening shortness of breath, fever, cough and lethargy for last 1 week. In ED, patient was saturating 90% on 2 L nasal cannula, chest x-ray with worsening multilobar pneumonia, COVID-19 test came back positive.  Consultants: None  Procedures: None  Antibiotics: Azithromycin  Subjective/Interval History: Patient states that he is feeling better.  Breathing has improved.  Does complain for wheezing this morning.  Dry cough.  No chest pain.   Assessment/Plan:  Acute Hypoxic Resp. Failure due to Acute Covid 19 Viral Illness during the ongoing 2020 Covid 19 Pandemic Patient states that she is feeling better.  She is still on 4-1/2 L of oxygen by nasal cannula.  She is noted to be wheezing this morning.  She will be given inhaler treatments.  Incentive spirometry.  Wean down oxygen as much as possible.  CRP was 20.2--23.6--14.4--8.3  -D-dimer 1.84--1.63--1.37--1.22  Patient has not been given any Actemra yet.  Patient has been given Solu-Medrol.  She remains on the same.  We will continue as she is wheezing.  Respiratory status remains stable.  Since patient does have wheezing and could have a component of acute bronchitis we will give her a course of azithromycin. Patient also being diuresed with furosemide.  Dose was decreased yesterday due to rising creatinine.  Essential hypertension Blood pressure was noted to be low over the last 24 hours.  Her hydrochlorothiazide and ACE inhibitor was discontinued.  She remains on amlodipine which will also be held for now.  Continue to monitor closely.    History of hyperlipidemia Continue current medications.  Hyperglycemia Most likely due to  steroids.    Continue Lantus and SSI.  May need to further titrate dose of Lantus.  Morbid obesity  -Body mass index is 48.67 kg/m.  Hypokalemia Potassium normal this morning.  Continue to monitor closely while she is being diuresed.  Normocytic anemia Likely dilutional.  No evidence of blood loss.  Monitor closely.   Code Status : Full  Family Communication  :  Discussed with patient's daughter yesterday  Disposition Plan  :  Unclear for now  Barriers For Discharge :  Still requiring high dose oxygen by nasal cannula.    Consults  :  None  Procedures  : None  DVT Prophylaxis  :  [place] lovenox  Medications:  Scheduled: . aspirin EC  81 mg Oral Daily  . atorvastatin  20 mg Oral q1800  . beclomethasone  2 puff Inhalation BID  . enoxaparin (LOVENOX) injection  40 mg Subcutaneous Q12H  . furosemide  60 mg Intravenous Daily  . guaiFENesin  1,200 mg Oral BID  . insulin aspart  0-20 Units Subcutaneous TID WC  . insulin aspart  0-5 Units Subcutaneous QHS  . insulin glargine  8 Units Subcutaneous Daily  . Ipratropium-Albuterol  2 puff Inhalation QID  . methylPREDNISolone (SOLU-MEDROL) injection  40 mg Intravenous Q12H  . pantoprazole  40 mg Oral Daily  . senna  1 tablet Oral BID  . vitamin C  500 mg Oral Daily  . zinc sulfate  220 mg Oral Daily   Continuous:  GGY:IRSWNIOEVOJJK **OR** acetaminophen, albuterol, chlorpheniramine-HYDROcodone, guaiFENesin-codeine, magnesium hydroxide, ondansetron **OR** ondansetron (ZOFRAN) IV, sodium chloride, sodium phosphate, sorbitol   Objective:  Vital Signs  Vitals:   03/27/19 0800 03/27/19  0830 03/27/19 0900 03/27/19 1000  BP: (!) 141/62  122/66 126/70  Pulse:  89 95 86  Resp:  18 18 (!) 21  Temp:   97.9 F (36.6 C)   TempSrc:   Oral   SpO2:  97% 97% 95%  Weight:      Height:        Intake/Output Summary (Last 24 hours) at 03/27/2019 1025 Last data filed at 03/26/2019 1700 Gross per 24 hour  Intake -  Output  1000 ml  Net -1000 ml   Filed Weights   03/23/19 1259 03/23/19 1800  Weight: (!) 145.2 kg (!) 142 kg    General appearance: Awake alert.  In no distress.  Morbidly obese Resp: Mildly tachypneic at rest.  End expiratory wheezing heard bilaterally.  Few crackles at the bases.  Cardio: S1-S2 is normal regular.  No S3-S4.  No rubs murmurs or bruit GI: Abdomen is soft.  Nontender nondistended.  Bowel sounds are present normal.  No masses organomegaly Extremities: No edema.  Full range of motion of lower extremities. Neurologic: Alert and oriented x3.  No focal neurological deficits.    Lab Results:  Data Reviewed: I have personally reviewed following labs and imaging studies  CBC: Recent Labs  Lab 03/23/19 1303 03/24/19 0855 03/25/19 0505 03/26/19 0455 03/27/19 0500  WBC 9.2 6.2 4.7 7.4 10.2  NEUTROABS 8.1*  --   --   --   --   HGB 10.6* 10.8* 10.5* 10.4* 10.6*  HCT 31.9* 32.8* 32.3* 32.2* 31.1*  MCV 82.9 82.0 82.2 81.5 81.2  PLT 263 300 334 403* 421*    Basic Metabolic Panel: Recent Labs  Lab 03/23/19 1303 03/24/19 0855 03/25/19 0505 03/26/19 0455 03/27/19 0500  NA 136 137 137 137 138  K 3.2* 3.1* 4.7 4.4 4.0  CL 97* 94* 97* 97* 98  CO2 29 32 30 28 30   GLUCOSE 138* 141* 250* 219* 205*  BUN 24* 19 30* 47* 54*  CREATININE 0.82 0.79 1.06* 1.28* 1.15*  CALCIUM 8.9 9.2 8.9 9.1 9.2  MG  --   --  1.9  --   --     GFR: Estimated Creatinine Clearance: 74.2 mL/min (A) (by C-G formula based on SCr of 1.15 mg/dL (H)).  Liver Function Tests: Recent Labs  Lab 03/23/19 1303 03/26/19 0455 03/27/19 0500  AST 26 18 16   ALT 23 18 18   ALKPHOS 104 101 97  BILITOT 0.7 0.5 0.4  PROT 7.5 7.4 7.4  ALBUMIN 3.5 3.2* 3.3*    Cardiac Enzymes: Recent Labs  Lab 03/23/19 1303  TROPONINI <0.03    HbA1C: Recent Labs    03/27/19 0500  HGBA1C 7.0*    CBG: Recent Labs  Lab 03/26/19 1128 03/26/19 1638 03/26/19 2039 03/27/19 0018 03/27/19 0815  GLUCAP 384* 318* 334*  217* 268*    Anemia Panel: Recent Labs    03/26/19 0455 03/27/19 0500  FERRITIN 187 173    Recent Results (from the past 240 hour(s))  SARS Coronavirus 2 [facility] order, Performed in [place] hospital lab)     Status: Abnormal   Collection Time: 03/23/19  1:03 PM  Result Value Ref Range Status   SARS Coronavirus 2 POSITIVE (A) NEGATIVE Final    Comment: RESULT CALLED TO, READ BACK BY AND VERIFIED WITH: P DOWD,RN 03/23/19 1457 RHOLMES (NOTE) If result is NEGATIVE SARS-CoV-2 target nucleic acids are NOT DETECTED. The SARS-CoV-2 RNA is generally detectable in upper and lower  respiratory specimens during the acute  phase of infection. The lowest  concentration of SARS-CoV-2 viral copies this assay can detect is 250  copies / mL. A negative result does not preclude SARS-CoV-2 infection  and should not be used as the sole basis for treatment or other  patient management decisions.  A negative result may occur with  improper specimen collection / handling, submission of specimen other  than nasopharyngeal swab, presence of viral mutation(s) within the  areas targeted by this assay, and inadequate number of viral copies  (<250 copies / mL). A negative result must be combined with clinical  observations, patient history, and epidemiological information. If result is POSITIVE SARS-CoV-2 target nucleic acids are DETECTED. The SA RS-CoV-2 RNA is generally detectable in upper and lower  respiratory specimens during the acute phase of infection.  Positive  results are indicative of active infection with SARS-CoV-2.  Clinical  correlation with patient history and other diagnostic information is  necessary to determine patient infection status.  Positive results do  not rule out bacterial infection or co-infection with other viruses. If result is PRESUMPTIVE POSTIVE SARS-CoV-2 nucleic acids MAY BE PRESENT.   A presumptive positive result was obtained on the submitted specimen  and  confirmed on repeat testing.  While 2019 novel coronavirus  (SARS-CoV-2) nucleic acids may be present in the submitted sample  additional confirmatory testing may be necessary for epidemiological  and / or clinical management purposes  to differentiate between  SARS-CoV-2 and other Sarbecovirus currently known to infect humans.  If clinically indicated additional testing with an alternate test  methodology [phone]) is advi sed. The SARS-CoV-2 RNA is generally  detectable in upper and lower respiratory specimens during the acute  phase of infection. The expected result is Negative. Fact Sheet for Patients:  StrictlyIdeas.no Fact Sheet for Healthcare Providers: BankingDealers.co.za This test is not yet approved or cleared by the [place] FDA and has been authorized for detection and/or diagnosis of SARS-CoV-2 by FDA under an Emergency Use Authorization (EUA).  This EUA will remain in effect (meaning this test can be used) for the duration of the COVID-19 declaration under Section 564(b)(1) of the Act, 21 U.S.C. section 360bbb-3(b)(1), unless the authorization is terminated or revoked sooner. Performed at [facility], [place] [address], [place], [place] [address]       Radiology Studies: No results found.     LOS: 4 days   Juni Glaab [employer] on [email or web address]  03/27/2019, 10:25 AM

## 2019-03-27 NOTE — Progress Notes (Signed)
Inpatient Diabetes Program Recommendations  AACE/ADA: New Consensus Statement on Inpatient Glycemic Control (2015)  Target Ranges:  Prepandial:   less than 140 mg/dL      Peak postprandial:   less than 180 mg/dL (1-2 hours)      Critically ill patients:  140 - 180 mg/dL   Lab Results  Component Value Date   GLUCAP 349 (H) 03/27/2019   HGBA1C 7.0 (H) 03/27/2019    Review of Glycemic Control  Diabetes history: None Outpatient Diabetes medications: N/A Current orders for Inpatient glycemic control: Lantus 15 units QD to start 4/29, Lantus 8 units 1x dose at 1600, Novolog 0-20 units tidwc and hs  On Solumedrol 40 mg Q12H, hopefully can be decreased soon HgbA1C - 7% - indicates diagnosis of DM Hx morbid obesity with BMI of 47.6%  Inpatient Diabetes Program Recommendations:     Add Novolog 4 units tidwc for meal coverage insulin if pt eats > 50% meal.  Will need to f/u with PCP with new diagnosis of DM.  Order Living Well with Diabetes book  Continue to follow.  Thank you. Lorenda Peck, RD, LDN, CDE Inpatient Diabetes Coordinator [phone]

## 2019-03-27 NOTE — Progress Notes (Signed)
CSW consulted with patient regarding her anxiety in returning home, per request of Shelly with patient experience.   Patient reports she lives in a home with her daughters and grandchildren and she would not like anyone to go through what she has been through. Patient reports she feels better today but is concerned about infecting family. CSW inquired as to household set up in which patient reports she has her own room with a shared bathroom. CSW encouraged patient to isolate in her room once returning home to prevent infection of family members. Patient inquired as to how long, CSW noted typically for 14 days. Patient asked about follow up visit, CSW informed of tele health visits that will be done once patient returns home to check in on status. Patient inquired regarding needing oxygen at home, CSW noted that goal is for patient to return home without oxygen however if she needs it upon discharge CSW and RNCM can ensure this is set up.   Patient reports feeling better about going home when medically ready. She states she is grateful for all staff that have been involved in care of patient and reports [facility] staff as being her "angels" and asked CSW to relay to team.   Please notify for any further CSW needs.   [place], [place]

## 2019-03-28 ENCOUNTER — Other Ambulatory Visit: Payer: Self-pay

## 2019-03-28 ENCOUNTER — Inpatient Hospital Stay (HOSPITAL_COMMUNITY): Payer: Medicare HMO

## 2019-03-28 DIAGNOSIS — R000 Tachycardia, unspecified: Secondary | ICD-10-CM

## 2019-03-28 LAB — CBC
HCT: 31.9 % — ABNORMAL LOW (ref 36.0–46.0)
Hemoglobin: 10.3 g/dL — ABNORMAL LOW (ref 12.0–15.0)
MCH: 26.6 pg (ref 26.0–34.0)
MCHC: 32.3 g/dL (ref 30.0–36.0)
MCV: 82.4 fL (ref 80.0–100.0)
Platelets: 416 10*3/uL — ABNORMAL HIGH (ref 150–400)
RBC: 3.87 MIL/uL (ref 3.87–5.11)
RDW: 13.2 % (ref 11.5–15.5)
WBC: 8.8 10*3/uL (ref 4.0–10.5)
nRBC: 0 % (ref 0.0–0.2)

## 2019-03-28 LAB — BASIC METABOLIC PANEL
Anion gap: 9 (ref 5–15)
BUN: 48 mg/dL — ABNORMAL HIGH (ref 8–23)
CO2: 31 mmol/L (ref 22–32)
Calcium: 9.3 mg/dL (ref 8.9–10.3)
Chloride: 99 mmol/L (ref 98–111)
Creatinine, Ser: 1.03 mg/dL — ABNORMAL HIGH (ref 0.44–1.00)
GFR calc Af Amer: >60 mL/min (ref >60)
GFR calc non Af Amer: 57 mL/min — ABNORMAL LOW (ref >60)
Glucose, Bld: 181 mg/dL — ABNORMAL HIGH (ref 70–99)
Potassium: 4.2 mmol/L (ref 3.5–5.1)
Sodium: 139 mmol/L (ref 135–145)

## 2019-03-28 LAB — GLUCOSE, CAPILLARY
Glucose-Capillary: 161 mg/dL — ABNORMAL HIGH (ref 70–99)
Glucose-Capillary: 193 mg/dL — ABNORMAL HIGH (ref 70–99)
Glucose-Capillary: 263 mg/dL — ABNORMAL HIGH (ref 70–99)
Glucose-Capillary: 377 mg/dL — ABNORMAL HIGH (ref 70–99)

## 2019-03-28 LAB — C-REACTIVE PROTEIN: CRP: 4.3 mg/dL — ABNORMAL HIGH (ref <1.0)

## 2019-03-28 LAB — D-DIMER, QUANTITATIVE: D-Dimer, Quant: 0.98 ug/mL-FEU — ABNORMAL HIGH (ref 0.00–0.50)

## 2019-03-28 LAB — MAGNESIUM: Magnesium: 2.4 mg/dL (ref 1.7–2.4)

## 2019-03-28 LAB — FERRITIN: Ferritin: 161 ng/mL (ref 11–307)

## 2019-03-28 MED ORDER — ENOXAPARIN SODIUM 100 MG/ML ~~LOC~~ SOLN
100.0000 mg | Freq: Once | SUBCUTANEOUS | Status: AC
  Administered 2019-03-28: 100 mg via SUBCUTANEOUS
  Filled 2019-03-28: qty 1

## 2019-03-28 MED ORDER — DILTIAZEM HCL 60 MG PO TABS
60.0000 mg | ORAL_TABLET | Freq: Four times a day (QID) | ORAL | Status: DC
  Administered 2019-03-28 – 2019-03-29 (×5): 60 mg via ORAL
  Filled 2019-03-28 (×9): qty 1

## 2019-03-28 MED ORDER — ENOXAPARIN SODIUM 150 MG/ML ~~LOC~~ SOLN
140.0000 mg | Freq: Two times a day (BID) | SUBCUTANEOUS | Status: DC
  Administered 2019-03-28 – 2019-03-31 (×6): 140 mg via SUBCUTANEOUS
  Filled 2019-03-28 (×8): qty 0.93

## 2019-03-28 MED ORDER — DILTIAZEM HCL 30 MG PO TABS
30.0000 mg | ORAL_TABLET | Freq: Four times a day (QID) | ORAL | Status: DC
  Administered 2019-03-28: 07:00:00 30 mg via ORAL
  Filled 2019-03-28 (×2): qty 1

## 2019-03-28 MED ORDER — LEVALBUTEROL TARTRATE 45 MCG/ACT IN AERO
2.0000 | INHALATION_SPRAY | RESPIRATORY_TRACT | Status: DC | PRN
  Administered 2019-03-29: 2 via RESPIRATORY_TRACT
  Filled 2019-03-28: qty 15

## 2019-03-28 MED ORDER — MORPHINE SULFATE (PF) 2 MG/ML IV SOLN
1.0000 mg | Freq: Once | INTRAVENOUS | Status: AC
  Filled 2019-03-28: qty 1

## 2019-03-28 MED ORDER — MORPHINE SULFATE (PF) 2 MG/ML IV SOLN
1.0000 mg | Freq: Once | INTRAVENOUS | Status: AC
  Administered 2019-03-28: 1 mg via INTRAVENOUS
  Filled 2019-03-28: qty 1

## 2019-03-28 NOTE — Progress Notes (Signed)
  PT Cancellation Note  Patient Details Name: Kayla Roberts MRN: [id number] DOB: Apr 23, 1956   Cancelled Treatment:    Reason Eval/Treat Not Completed: Medical issues which prohibited therapy   Claretha Cooper 03/28/2019, 1:10 PM  Tresa Endo PT Acute Rehabilitation Services Pager [phone] Office [phone]

## 2019-03-28 NOTE — Progress Notes (Signed)
Telemetry called approx 0530 with reports of pt with change in cardiac rhythm, now in in afib. Denies hx of the same. Pt noted in be in afib 110's, BP stable.  She denies pain, reports she does feel weaker this morning. EKG obtained, notified on call [facility], [place] via telephone, awaiting further orders.

## 2019-03-28 NOTE — Progress Notes (Signed)
ANTICOAGULATION CONSULT NOTE - Initial Consult  Pharmacy Consult for Lovenox Indication: atrial fibrillation  No Known Allergies  Patient Measurements: Height: 5\' 8"  (172.7 cm) Weight: (!) 313 lb 0.9 oz (142 kg) IBW/kg (Calculated) : 63.9  Vital Signs: Temp: 98.5 F (36.9 C) (04/30 0800) Temp Source: Oral (04/30 0800) BP: 117/85 (04/30 0800) Pulse Rate: 136 (04/30 0800)  Labs: Recent Labs    03/26/19 0455 03/27/19 0500 03/28/19 0535  HGB 10.4* 10.6* 10.3*  HCT 32.2* 31.1* 31.9*  PLT 403* 421* 416*  CREATININE 1.28* 1.15* 1.03*    Estimated Creatinine Clearance: 82.8 mL/min (A) (by C-G formula based on SCr of 1.03 mg/dL (H)).   Medical History: Past Medical History:  Diagnosis Date  . Anxiety   . Asthma   . Depression   . GERD (gastroesophageal reflux disease)   . Hyperlipidemia   . Hypertension   . Morbid obesity ([place])     Medications:  Medications Prior to Admission  Medication Sig Dispense Refill Last Dose  . albuterol (PROVENTIL HFA;VENTOLIN HFA) 108 (90 Base) MCG/ACT inhaler INHALE 2 PUFFS INTO THE LUNGS EVERY 6 HOURS AS NEEDED FOR WHEEZING OR SHORTNESS OF BREATH (Patient taking differently: Inhale 2 puffs into the lungs every 6 (six) hours as needed for wheezing or shortness of breath. ) 18 g 1 03/23/2019 at Unknown time  . amLODipine (NORVASC) 5 MG tablet TAKE 1 TABLET(5 MG) BY MOUTH DAILY (Patient taking differently: Take 5 mg by mouth daily. ) 90 tablet 1 03/23/2019 at Unknown time  . aspirin 81 MG tablet Take 81 mg by mouth daily.    03/22/2019 at Unknown time  . atorvastatin (LIPITOR) 20 MG tablet Take 1 tablet (20 mg total) by mouth daily. 30 tablet 11 03/23/2019 at Unknown time  . [EXPIRED] doxycycline (VIBRAMYCIN) 100 MG capsule Take 1 capsule (100 mg total) by mouth 2 (two) times daily for 7 days. 14 capsule 0 03/23/2019 at Unknown time  . Flaxseed, Linseed, (FLAX SEEDS PO) Take 1 tablet by mouth daily.   03/22/2019 at Unknown time  . guaiFENesin-codeine  100-10 MG/5ML syrup Take 5 mLs by mouth at bedtime as needed for cough. 118 mL 0 Past Week at Unknown time  . hydrochlorothiazide (HYDRODIURIL) 25 MG tablet Take 25 mg by mouth daily.   03/23/2019 at Unknown time  . lisinopril (PRINIVIL,ZESTRIL) 20 MG tablet Take 1 tablet (20 mg total) by mouth daily. 30 tablet 2 03/23/2019 at Unknown time  . predniSONE (STERAPRED UNI-PAK 21 TAB) 10 MG (21) TBPK tablet Take by mouth daily. Take 6 tabs by mouth daily  for 2 days, then 5 tabs for 2 days, then 4 tabs for 2 days, then 3 tabs for 2 days, 2 tabs for 2 days, then 1 tab by mouth daily for 2 days 42 tablet 0 03/23/2019 at Unknown time  . QVAR REDIHALER 40 MCG/ACT inhaler INHALE 2 PUFFS INTO THE LUNGS TWICE DAILY 10.6 g 5 Past Week at Unknown time    Assessment: 49 YOF with new onset Afib to start therapeutic anticoagulation with Lovenox. H/H and Plt have been stable. Of note, patient received a dose of Lovenox 40 mg this AM for VTE prophylaxis. CrCl > 30 ml/min   Goal of Therapy:  Anti-Xa level 0.6-1 units/ml 4hrs after LMWH dose given Monitor platelets by anticoagulation protocol: Yes   Plan:  -Start Lovenox 140 mg (1 mg/kg) twice daily. Will only 100 mg this AM since she already received 40 mg.  -Monitory CBC and renal fx  -  Monitor for any s/s of bleeding   Albertina Parr, PharmD., BCPS Clinical Pharmacist Clinical phone for 03/28/19 until 4pm: [phone] If after 4pm, please refer to [facility] for unit-specific pharmacist

## 2019-03-28 NOTE — Progress Notes (Addendum)
Patient independently getting self to [facility] and back and forth from bed to chair and vice versa. O2 sat % ranging from 94-98% on 3.5LPM via [place]. Patient afebrile through out the night. Will continue to monitor.

## 2019-03-28 NOTE — Progress Notes (Signed)
Pt c/o chest pain at 2325, rated 8/10, described as crushing in center and upper right side of chest. VS stable as charted, rhythm remains a. Fib as it has throughout the day with rate in 70s-80s. Paged on call MD and he reported to bedside to assess pt. EKG and troponin completed, morphine 1mg  IV given. Pain level down to 3/10 20 mins after administration. Will continue to monitor per MD's recommendation. Hortencia Conradi RN

## 2019-03-28 NOTE — Progress Notes (Signed)
PROGRESS NOTE  Kayla Roberts MGQ:[id number] DOB: 1956-07-11 DOA: 03/23/2019  PCP: Velna Ochs, MD  Brief History/Interval Summary:  63 y.o.femalewith past medical history of hypertension, hyperlipidemia, morbid obesity, GERD, depression, anxiety, asthma, presented from home with complaint of worsening shortness of breath, fever, cough and lethargy for last 1 week. In ED, patient was saturating 90% on 2 L nasal cannula, chest x-ray with worsening multilobar pneumonia, COVID-19 test came back positive.  Consultants: None  Procedures: None  Antibiotics: Azithromycin  Subjective/Interval History: Patient mentioned that overnight she noticed palpitations and a little chest tightness.  She was found to have tachycardia.  Shortness of breath about the same as yesterday.  No significant improvement.  Continues to have a dry cough.  Some wheezing.     Assessment/Plan:  Acute Hypoxic Resp. Failure due to Acute Covid 19 Viral Illness during the ongoing 2020 Covid 19 Pandemic Patient continues to have shortness of breath.  Probably worsened by her tachyarrhythmia.  She still requires 4 to 5 L of oxygen by nasal cannula.  Wheezing about the same.  Continue inhaler treatments.  Incentive spirometry.  Steroids.  Wean down oxygen as tolerated.  Change to Xopenex inhalers due to her tachyarrhythmia.  CRP was 20.2--23.6--14.4--8.3  -D-dimer 1.84--1.63--1.37--1.22- 0.98  Patient has not been given any Actemra yet.  Patient has been given Solu-Medrol.    Continue for now due to her wheezing.   Continue azithromycin.   Patient also being diuresed with furosemide.  Dose was decreased due to rising creatinine.  Strict ins and outs.  Daily weights. Chest x-ray done this morning showed stable findings.  Tachycardia, MAT versus atrial fibrillation EKG reviewed.  Noted to be irregular.  Could be atrial fibrillation although occasional P waves are noted.  Patient placed on oral Cardizem.  If heart rate  does not improve we may have to increase the dose.  Patient is hemodynamically stable at this time.  For now we will increase her anticoagulation to therapeutic dose.  Patient denies any history of atrial fibrillation.  Has not been on anticoagulation previously.  Denies any history of bleeding.  Essential hypertension Due to soft blood pressure patient's hydrochlorothiazide, amlodipine and ACE inhibitor were discontinued.  Blood pressure is stable.  History of hyperlipidemia Continue current medications.  Hyperglycemia/newly diagnosed diabetes mellitus HbA1c 7.0.   Patient without previous history of diagnosed diabetes.  She has been on a steroid taper recently.  All this could be due to just steroids.  She will need outpatient monitoring as well.  For now continue Lantus and SSI.    Morbid obesity  -Body mass index is 48.67 kg/m.  Hypokalemia Potassium has been repleted.  Magnesium 2.4.    Normocytic anemia Likely dilutional.  No evidence of blood loss.  Monitor closely.   Code Status : Full  Family Communication  :  Discussed with patient today.  Discussed with the daughter yesterday.  Disposition Plan  :  Unclear for now  Barriers For Discharge :  Still requiring high dose oxygen by nasal cannula.    Consults  :  None  Procedures  : None  DVT Prophylaxis  :  lovenox  Medications:  Scheduled: . aspirin EC  81 mg Oral Daily  . atorvastatin  20 mg Oral q1800  . azithromycin  500 mg Oral Daily  . beclomethasone  2 puff Inhalation BID  . diltiazem  30 mg Oral Q6H  . enoxaparin (LOVENOX) injection  40 mg Subcutaneous Q12H  . furosemide  60 mg  Intravenous Daily  . guaiFENesin  1,200 mg Oral BID  . insulin aspart  0-20 Units Subcutaneous TID WC  . insulin aspart  0-5 Units Subcutaneous QHS  . insulin glargine  15 Units Subcutaneous Daily  . Ipratropium-Albuterol  2 puff Inhalation QID  . methylPREDNISolone (SOLU-MEDROL) injection  40 mg Intravenous Q12H   . pantoprazole  40 mg Oral Daily  . senna  1 tablet Oral BID  . vitamin C  500 mg Oral Daily  . zinc sulfate  220 mg Oral Daily   Continuous:  ERD:EYCXKGYJEHUDJ **OR** acetaminophen, chlorpheniramine-HYDROcodone, guaiFENesin-codeine, levalbuterol, magnesium hydroxide, ondansetron **OR** ondansetron (ZOFRAN) IV, sodium chloride, sodium phosphate, sorbitol   Objective:  Vital Signs  Vitals:   03/28/19 0602 03/28/19 0607 03/28/19 0623 03/28/19 0800  BP:    117/85  Pulse: (!) 102 (!) 119 (!) 139 (!) 136  Resp: 20 19 (!) 21 (!) 21  Temp:    98.5 F (36.9 C)  TempSrc:    Oral  SpO2: 98% 97% 99% 96%  Weight:      Height:        Intake/Output Summary (Last 24 hours) at 03/28/2019 0909 Last data filed at 03/28/2019 0600 Gross per 24 hour  Intake 240 ml  Output 1725 ml  Net -1485 ml   Filed Weights   03/23/19 1259 03/23/19 1800  Weight: (!) 145.2 kg (!) 142 kg   General appearance: Awake alert.  In no distress.  Morbidly obese Resp: Mildly tachypneic.  Coarse breath sounds bilaterally with crackles at the bases.  Mild wheezing.  No rhonchi.   Cardio: 1 S2 is irregularly irregular.  Tachycardic.  Telemetry shows irregular rhythm.  Possible atrial fibrillation.   GI: Abdomen is soft.  Nontender nondistended.  Bowel sounds are present normal.  No masses organomegaly Extremities: No edema.  Full range of motion of lower extremities. Neurologic: Alert and oriented x3.  No focal neurological deficits.     Lab Results:  Data Reviewed: I have personally reviewed following labs and imaging studies  CBC: Recent Labs  Lab 03/23/19 1303 03/24/19 0855 03/25/19 0505 03/26/19 0455 03/27/19 0500 03/28/19 0535  WBC 9.2 6.2 4.7 7.4 10.2 8.8  NEUTROABS 8.1*  --   --   --   --   --   HGB 10.6* 10.8* 10.5* 10.4* 10.6* 10.3*  HCT 31.9* 32.8* 32.3* 32.2* 31.1* 31.9*  MCV 82.9 82.0 82.2 81.5 81.2 82.4  PLT 263 300 334 403* 421* 416*    Basic Metabolic Panel: Recent Labs  Lab  03/24/19 0855 03/25/19 0505 03/26/19 0455 03/27/19 0500 03/28/19 0535  NA 137 137 137 138 139  K 3.1* 4.7 4.4 4.0 4.2  CL 94* 97* 97* 98 99  CO2 32 30 28 30 31   GLUCOSE 141* 250* 219* 205* 181*  BUN 19 30* 47* 54* 48*  CREATININE 0.79 1.06* 1.28* 1.15* 1.03*  CALCIUM 9.2 8.9 9.1 9.2 9.3  MG  --  1.9  --   --  2.4    GFR: Estimated Creatinine Clearance: 82.8 mL/min (A) (by C-G formula based on SCr of 1.03 mg/dL (H)).  Liver Function Tests: Recent Labs  Lab 03/23/19 1303 03/26/19 0455 03/27/19 0500  AST 26 18 16   ALT 23 18 18   ALKPHOS 104 101 97  BILITOT 0.7 0.5 0.4  PROT 7.5 7.4 7.4  ALBUMIN 3.5 3.2* 3.3*    Cardiac Enzymes: Recent Labs  Lab 03/23/19 1303  TROPONINI <0.03    HbA1C: Recent Labs  03/27/19 0500  HGBA1C 7.0*    CBG: Recent Labs  Lab 03/27/19 0815 03/27/19 1215 03/27/19 1659 03/27/19 2118 03/28/19 0737  GLUCAP 268* 349* 390* 295* 161*    Anemia Panel: Recent Labs    03/27/19 0500 03/28/19 0535  FERRITIN 173 161    Recent Results (from the past 240 hour(s))  SARS Coronavirus 2 [facility] order, Performed in [place] hospital lab)     Status: Abnormal   Collection Time: 03/23/19  1:03 PM  Result Value Ref Range Status   SARS Coronavirus 2 POSITIVE (A) NEGATIVE Final    Comment: RESULT CALLED TO, READ BACK BY AND VERIFIED WITH: P DOWD,RN 03/23/19 1457 RHOLMES (NOTE) If result is NEGATIVE SARS-CoV-2 target nucleic acids are NOT DETECTED. The SARS-CoV-2 RNA is generally detectable in upper and lower  respiratory specimens during the acute phase of infection. The lowest  concentration of SARS-CoV-2 viral copies this assay can detect is 250  copies / mL. A negative result does not preclude SARS-CoV-2 infection  and should not be used as the sole basis for treatment or other  patient management decisions.  A negative result may occur with  improper specimen collection / handling, submission of specimen other  than  nasopharyngeal swab, presence of viral mutation(s) within the  areas targeted by this assay, and inadequate number of viral copies  (<250 copies / mL). A negative result must be combined with clinical  observations, patient history, and epidemiological information. If result is POSITIVE SARS-CoV-2 target nucleic acids are DETECTED. The SA RS-CoV-2 RNA is generally detectable in upper and lower  respiratory specimens during the acute phase of infection.  Positive  results are indicative of active infection with SARS-CoV-2.  Clinical  correlation with patient history and other diagnostic information is  necessary to determine patient infection status.  Positive results do  not rule out bacterial infection or co-infection with other viruses. If result is PRESUMPTIVE POSTIVE SARS-CoV-2 nucleic acids MAY BE PRESENT.   A presumptive positive result was obtained on the submitted specimen  and confirmed on repeat testing.  While 2019 novel coronavirus  (SARS-CoV-2) nucleic acids may be present in the submitted sample  additional confirmatory testing may be necessary for epidemiological  and / or clinical management purposes  to differentiate between  SARS-CoV-2 and other Sarbecovirus currently known to infect humans.  If clinically indicated additional testing with an alternate test  methodology [phone]) is advi sed. The SARS-CoV-2 RNA is generally  detectable in upper and lower respiratory specimens during the acute  phase of infection. The expected result is Negative. Fact Sheet for Patients:  StrictlyIdeas.no Fact Sheet for Healthcare Providers: BankingDealers.co.za This test is not yet approved or cleared by the [place] FDA and has been authorized for detection and/or diagnosis of SARS-CoV-2 by FDA under an Emergency Use Authorization (EUA).  This EUA will remain in effect (meaning this test can be used) for the duration of  the COVID-19 declaration under Section 564(b)(1) of the Act, 21 U.S.C. section 360bbb-3(b)(1), unless the authorization is terminated or revoked sooner. Performed at [facility], [place] [address], [place], [place] [address]       Radiology Studies: Dg Chest Port 1 View  Result Date: 03/28/2019 CLINICAL DATA:  Pneumonia. EXAM: PORTABLE CHEST 1 VIEW COMPARISON:  Radiograph of March 25, 2019. FINDINGS: Stable cardiomegaly. No pneumothorax or pleural effusion is noted. Stable bilateral lung opacities are noted, right greater than left, concerning for pneumonia. Bony thorax is unremarkable. IMPRESSION: Stable  bilateral lung opacities are noted concerning, right greater than left. Electronically Signed   By: Marijo Conception M.D.   On: 03/28/2019 07:42       LOS: 5 days   [facility] Pager on [email or web address]  03/28/2019, 9:09 AM

## 2019-03-28 NOTE — Significant Event (Signed)
Called by RN that tele noted possible afib. EKG shows evidence of irregularly regular rhythm but P-waves intermittently noticeable. DDx is afib vs multifocal atrial tachycardia. Will start low dose diltiazem for tachycardia but hold on anticoagulation at this time.

## 2019-03-29 DIAGNOSIS — R079 Chest pain, unspecified: Secondary | ICD-10-CM

## 2019-03-29 DIAGNOSIS — I4891 Unspecified atrial fibrillation: Secondary | ICD-10-CM

## 2019-03-29 LAB — COMPREHENSIVE METABOLIC PANEL
ALT: 26 U/L (ref 0–44)
AST: 23 U/L (ref 15–41)
Albumin: 3.3 g/dL — ABNORMAL LOW (ref 3.5–5.0)
Alkaline Phosphatase: 107 U/L (ref 38–126)
Anion gap: 9 (ref 5–15)
BUN: 37 mg/dL — ABNORMAL HIGH (ref 8–23)
CO2: 32 mmol/L (ref 22–32)
Calcium: 9.2 mg/dL (ref 8.9–10.3)
Chloride: 96 mmol/L — ABNORMAL LOW (ref 98–111)
Creatinine, Ser: 1.04 mg/dL — ABNORMAL HIGH (ref 0.44–1.00)
GFR calc Af Amer: >60 mL/min (ref >60)
GFR calc non Af Amer: 57 mL/min — ABNORMAL LOW (ref >60)
Glucose, Bld: 320 mg/dL — ABNORMAL HIGH (ref 70–99)
Potassium: 3.6 mmol/L (ref 3.5–5.1)
Sodium: 137 mmol/L (ref 135–145)
Total Bilirubin: 0.5 mg/dL (ref 0.3–1.2)
Total Protein: 7.4 g/dL (ref 6.5–8.1)

## 2019-03-29 LAB — GLUCOSE, CAPILLARY
Glucose-Capillary: 208 mg/dL — ABNORMAL HIGH (ref 70–99)
Glucose-Capillary: 361 mg/dL — ABNORMAL HIGH (ref 70–99)
Glucose-Capillary: 350 mg/dL — ABNORMAL HIGH (ref 70–99)
Glucose-Capillary: 316 mg/dL — ABNORMAL HIGH (ref 70–99)

## 2019-03-29 LAB — CBC
HCT: 34 % — ABNORMAL LOW (ref 36.0–46.0)
Hemoglobin: 11.3 g/dL — ABNORMAL LOW (ref 12.0–15.0)
MCH: 27 pg (ref 26.0–34.0)
MCHC: 33.2 g/dL (ref 30.0–36.0)
MCV: 81.3 fL (ref 80.0–100.0)
Platelets: 437 10*3/uL — ABNORMAL HIGH (ref 150–400)
RBC: 4.18 MIL/uL (ref 3.87–5.11)
RDW: 13.2 % (ref 11.5–15.5)
WBC: 8.9 10*3/uL (ref 4.0–10.5)
nRBC: 0 % (ref 0.0–0.2)

## 2019-03-29 LAB — TROPONIN I
Troponin I: <0.03 ng/mL (ref <0.03)
Troponin I: <0.03 ng/mL (ref <0.03)

## 2019-03-29 LAB — TSH: TSH: <0.01 u[IU]/mL — ABNORMAL LOW (ref 0.350–4.500)

## 2019-03-29 LAB — C-REACTIVE PROTEIN: CRP: 3.1 mg/dL — ABNORMAL HIGH (ref <1.0)

## 2019-03-29 LAB — D-DIMER, QUANTITATIVE: D-Dimer, Quant: 1.14 ug/mL-FEU — ABNORMAL HIGH (ref 0.00–0.50)

## 2019-03-29 MED ORDER — DIPHENHYDRAMINE HCL 25 MG PO CAPS
25.0000 mg | ORAL_CAPSULE | Freq: Once | ORAL | Status: AC
  Administered 2019-03-29: 01:00:00 25 mg via ORAL
  Filled 2019-03-29: qty 1

## 2019-03-29 MED ORDER — PREDNISONE 10 MG PO TABS
40.0000 mg | ORAL_TABLET | Freq: Two times a day (BID) | ORAL | Status: DC
  Administered 2019-03-30 – 2019-03-31 (×3): 40 mg via ORAL
  Filled 2019-03-29 (×4): qty 4

## 2019-03-29 MED ORDER — PANTOPRAZOLE SODIUM 40 MG PO TBEC
40.0000 mg | DELAYED_RELEASE_TABLET | Freq: Two times a day (BID) | ORAL | Status: DC
  Administered 2019-03-29 – 2019-04-03 (×10): 40 mg via ORAL
  Filled 2019-03-29 (×10): qty 1

## 2019-03-29 MED ORDER — DILTIAZEM HCL 90 MG PO TABS
90.0000 mg | ORAL_TABLET | Freq: Four times a day (QID) | ORAL | Status: DC
  Filled 2019-03-29 (×4): qty 1

## 2019-03-29 MED ORDER — DILTIAZEM HCL 30 MG PO TABS
90.0000 mg | ORAL_TABLET | Freq: Four times a day (QID) | ORAL | Status: DC
  Administered 2019-03-29 – 2019-03-30 (×3): 90 mg via ORAL
  Filled 2019-03-29 (×4): qty 3

## 2019-03-29 MED ORDER — POTASSIUM CHLORIDE CRYS ER 20 MEQ PO TBCR
40.0000 meq | EXTENDED_RELEASE_TABLET | Freq: Once | ORAL | Status: AC
  Administered 2019-03-29: 40 meq via ORAL
  Filled 2019-03-29: qty 2

## 2019-03-29 NOTE — TOC Initial Note (Addendum)
Transition of Care [facility]) - Initial/Assessment Note    Patient Details  Name: Kayla Roberts MRN: [id number] Date of Birth: 14-Apr-1956  [facility] of [facility] CM/SW Contact:    Midge Minium RN, BSN, NCM-BC, ACM-RN [phone] (working remotely) Phone Number: 03/29/2019, 1:39 PM  Clinical Narrative:                 63 yo female presented with PNA d/t COVID-19. PMH: hypertension, hyperlipidemia, morbid obesity, GERD, depression, anxiety, asthma. CM spoke to the patient via phone to discuss the POC. Patient states she lives at home with her daughters, one daughter works at [employer] at [facility] [employer] and her grandchildren (2) are 5yo. Patient was independent with her ADLs PTA with no DME in use. PCP: Dr. Velna Ochs. DM discussed the POC with DME (BSC, RW)  recommended per PT/OT recommendations with patient agreeable. DME preference provided with [employer] selected; AVS updated. CM continue to follow for possible home oxygen needs. Patient stated her grandchildren will be home and available while her daughter is working. CM team will continue to follow for dispositional needs.    Expected Discharge Plan: Home/Self Care Barriers to Discharge: Continued Medical Work up   Patient Goals and CMS Choice Patient states their goals for this hospitalization and ongoing recovery are:: "to get better, I'm getting better each day" CMS Medicare.gov Compare Post Acute Care list provided to:: Patient Choice offered to / list presented to : Patient  Expected Discharge Plan and Services Expected Discharge Plan: Home/Self Care In-house Referral: Clinical Social Work Discharge Planning Services: CM Consult Post Acute Care Choice: Durable Medical Equipment Living arrangements for the past 2 months: Single Family Home                 DME Arranged: Bedside commode, Oxygen, Walker rolling DME Agency: Paramedic       HH Arranged: NA [place] Agency: NA        Prior Living Arrangements/Services Living arrangements  for the past 2 months: Single Family Home Lives with:: Self, Adult Children, Other (Comment)(Grandchildren) Patient language and need for interpreter reviewed:: Yes Do you feel safe going back to the place where you live?: Yes      Need for Family Participation in Patient Care: Yes (Comment) Care giver support system in place?: Yes (comment)   Criminal Activity/Legal Involvement Pertinent to Current Situation/Hospitalization: No - Comment as needed  Activities of Daily Living Home Assistive Devices/Equipment: None ADL Screening (condition at time of admission) Patient's cognitive ability adequate to safely complete daily activities?: Yes Is the patient deaf or have difficulty hearing?: No Does the patient have difficulty seeing, even when wearing glasses/contacts?: No Does the patient have difficulty concentrating, remembering, or making decisions?: No Patient able to express need for assistance with ADLs?: Yes Does the patient have difficulty dressing or bathing?: Yes Independently performs ADLs?: No Does the patient have difficulty walking or climbing stairs?: Yes Weakness of Legs: Both Weakness of Arms/Hands: None  Permission Sought/Granted Permission sought to share information with : Case Manager Permission granted to share information with : Yes, Verbal Permission Granted     Permission granted to share info w AGENCY: [facility] DME agency liaison        Emotional Assessment   Attitude/Demeanor/Rapport: Engaged Affect (typically observed): Pleasant, Hopeful, Appropriate, Accepting Orientation: : Oriented to Self, Oriented to Situation, Oriented to Place, Oriented to  Time Alcohol / Substance Use: Not Applicable Psych Involvement: No (comment)  Admission diagnosis:  [facility], pneumonia Patient Active Problem List  Diagnosis Date Noted  . Acute on chronic respiratory failure with hypoxia ([place]) 03/25/2019  . Hyperglycemia 03/25/2019  . Pneumonia due to COVID-19 virus 03/23/2019   . Community acquired pneumonia of left lung 03/21/2019  . Acute midline low back pain without sciatica 12/06/2018  . Post-viral cough syndrome 06/26/2018  . Chest pain 03/20/2018  . Internal hemorrhoid 10/13/2017  . Healthcare maintenance 10/17/2016  . Morbid obesity ([place]) 09/23/2016  . Primary localized osteoarthritis of knee 02/02/2016  . ANEMIA NOS 09/17/2007  . HYPERCHOLESTEROLEMIA 09/25/2006  . Major depression, chronic 09/25/2006  . Essential hypertension 09/25/2006  . Moderate persistent asthma 09/25/2006  . GERD 09/25/2006   PCP:  Velna Ochs, MD Pharmacy:   [facility] [place], [address] [address] AT [place] [place] [address] Phone: [phone] Fax: [phone]     Social Determinants of Health (SDOH) Interventions    Readmission Risk Interventions No flowsheet data found.

## 2019-03-29 NOTE — Progress Notes (Signed)
Physical Therapy Treatment Patient Details Name: Kayla Roberts MRN: [id number] DOB: August 29, 1956 Today's Date: 03/29/2019    History of Present Illness 63 y.o. female with past medical history of hypertension, hyperlipidemia, morbid obesity, GERD, depression, anxiety, asthma. admitted to [facility] with pna d/t covid 19; pt went into abib with RVR 4/30, on oral cardizem    PT Comments    Pt feeling well but with continued incr HR/tachy (went into abib with RVR yesterday); reviewed gentle bed exercises with multiple rests; pt tol well; HR 110--125max)  Follow Up Recommendations  No PT follow up     Equipment Recommendations  Rolling walker with 5" wheels;3in1 (PT)    Recommendations for Other Services       Precautions / Restrictions Precautions Precaution Comments: monitor VS--pt went into afib with RVR 4/30; on oral cardizem; remains tachy  Restrictions Weight Bearing Restrictions: No(Simultaneous filing. User may not have seen previous data.)    Mobility  Bed Mobility               General bed mobility comments: NT d/t incr HR  Transfers                    Ambulation/Gait                 Stairs             Wheelchair Mobility    Modified Rankin (Stroke Patients Only)       Balance                                            Cognition Arousal/Alertness: Awake/alert Behavior During Therapy: WFL for tasks assessed/performed Overall Cognitive Status: Within Functional Limits for tasks assessed                                        Exercises General Exercises - Lower Extremity Ankle Circles/Pumps: AROM;Both;15 reps Quad Sets: AROM;Both;10 reps Gluteal Sets: AROM;Both;10 reps Short Arc Quad: AROM;10 reps;Both Heel Slides: AROM;10 reps;Supine;Both Hip ABduction/ADduction: AROM;Both;10 reps;Supine    General Comments        Pertinent Vitals/Pain Pain Assessment: No/denies pain    Home Living                      Prior Function            PT Goals (current goals can now be found in the care plan section) Acute Rehab PT Goals Patient Stated Goal: home soon PT Goal Formulation: With patient Time For Goal Achievement: 04/08/19 Potential to Achieve Goals: Good Progress towards PT goals: Progressing toward goals    Frequency    Min 4X/week      PT Plan Current plan remains appropriate    Co-evaluation              AM-PAC PT "6 Clicks" Mobility   Outcome Measure  Help needed turning from your back to your side while in a flat bed without using bedrails?: A Little Help needed moving from lying on your back to sitting on the side of a flat bed without using bedrails?: A Little Help needed moving to and from a bed to a chair (including a wheelchair)?: A Little Help needed standing up from a chair using  your arms (e.g., wheelchair or bedside chair)?: A Little Help needed to walk in hospital room?: A Little Help needed climbing 3-5 steps with a railing? : A Little 6 Click Score: 18    End of Session   Activity Tolerance: Treatment limited secondary to medical complications (Comment)(incr HR, tachy) Patient left: in bed;with call bell/phone within reach   PT Visit Diagnosis: Unsteadiness on feet (R26.81)     Time: 1032-1050 PT Time Calculation (min) (ACUTE ONLY): 18 min  Charges:  $Therapeutic Exercise: 8-22 mins                     Kenyon Ana, PT  Pager: [phone] Acute Rehab Dept [facility]): [phone]   03/29/2019    [facility] [place] 03/29/2019, 10:50 AM

## 2019-03-29 NOTE — Progress Notes (Signed)
Inpatient Diabetes Program Recommendations  AACE/ADA: New Consensus Statement on Inpatient Glycemic Control (2015)  Target Ranges:  Prepandial:   less than 140 mg/dL      Peak postprandial:   less than 180 mg/dL (1-2 hours)      Critically ill patients:  140 - 180 mg/dL   Results for Kayla Roberts, Kayla Roberts (MRN [id number]) as of 03/29/2019 08:57  Ref. Range 03/28/2019 07:37 03/28/2019 12:07 03/28/2019 16:34 03/28/2019 20:57  Glucose-Capillary Latest Ref Range: 70 - 99 mg/dL 161 (H)  4 units NOVOLOG +  15 units LANTUS given at 9:00am  193 (H)  4 units NOVOLOG  263 (H)  11 units NOVOLOG  377 (H)  5 units NOVOLOG    Results for Kayla Roberts, Kayla Roberts (MRN [id number]) as of 03/29/2019 08:57  Ref. Range 03/29/2019 07:56  Glucose-Capillary Latest Ref Range: 70 - 99 mg/dL 208 (H)     Admit with: Acute Hypoxic Resp. Failure due to Acute Covid 19 Viral Illness   New Diagnosis of DM made this admission as well with A1c level of 7%  Current Insulin Orders: Lantus 15 units Daily         Novolog Resistant Correction Scale/ SSI (0-20 units) TID AC + HS      Getting Solumedrol 40 mg BID.  Fasting CBG this AM elevated slightly to 208 mg/dl.  Note patient with new diagnosis of DM this admission.  Per MD notes, "She has been on a steroid taper recently.  All this could be due to just steroids.  She will need outpatient monitoring as well".   MD- Please consider the following in-hospital insulin adjustments:  1. Increase Lantus slightly to 17 units Daily while patient remains on steroids  If dose already given this AM, please order an extra Lantus 2 units X 1 dose to be given this AM as well to equal a total of 17 units   2. Start Novolog Meal Coverage: Novolog 4 units TID with meals  (Please add the following Hold Parameters: Hold if pt eats <50% of meal, Hold if pt NPO)   3. Given patient was on Prednisone prior to admission, this may be the cause for part of her Hemoglobin A1c elevation to 7%.   Plan to call pt today to discuss her A1c and glucose levels with her.  Not sure if she will need any oral meds like Metformin at time of d/c??  May want to have pt follow up with her PCP at the [facility] clinic and have her PCP address her elevated glucose levels/ elevated A1c/ possible new diagnosis of DM as well.    --Will follow patient during hospitalization--  Wyn Quaker RN, MSN, CDE Diabetes Coordinator Inpatient Glycemic Control Team Team Pager: [phone] (8a-5p)

## 2019-03-29 NOTE — Progress Notes (Signed)
Marvel Plan from the [facility] Department [phone] called to get information from the pt. Confirmed pts phone number with her and updated pt.

## 2019-03-29 NOTE — Progress Notes (Signed)
PROGRESS NOTE  Kayla Roberts ERX:[id number] DOB: 03-01-1956 DOA: 03/23/2019  PCP: Velna Ochs, MD  Brief History/Interval Summary:  63 y.o.femalewith past medical history of hypertension, hyperlipidemia, morbid obesity, GERD, depression, anxiety, asthma, presented from home with complaint of worsening shortness of breath, fever, cough and lethargy for last 1 week. In ED, patient was saturating 90% on 2 L nasal cannula, chest x-ray with worsening multilobar pneumonia, COVID-19 test came back positive.  Consultants: None  Procedures: None  Antibiotics: Azithromycin  Subjective/Interval History: Overnight patient developed chest pain.  This was retrosternal.  Described as a pressure-like sensation.  EKG was done which did not show any ischemic changes.  Did show atrial fibrillation.  Heart rate was stable at that time.  Troponin was noted to be normal.  She was given morphine with improvement in her symptoms.  She denies any chest pain this morning.  No nausea vomiting.  Seems to be slightly anxious.     Assessment/Plan:  Acute Hypoxic Resp. Failure due to Acute Covid 19 Viral Illness during the ongoing 2020 Covid 19 Pandemic Patient seems to be doing better from a respiratory standpoint.  Her oxygen requirement continues to decrease.  She currently is on 2 L of oxygen by nasal cannula.  No wheezing heard today.  Wean down steroids.  Continue Xopenex inhalers.  Incentive spirometry.  CRP was 20.2--23.6--14.4--8.3-4.3-3.1 -D-dimer 1.84--1.63--1.37--1.22- 0.98-1.14  Patient has not required experimental therapies such as Actemra. Patient has been given Solu-Medrol.  Wheezing has improved.  Change to oral steroids.  Continue azithromycin.   Patient has also been diuresed with furosemide.  Weight is noted to be 141.7 kg today.  It was 145.2 kg on April 25.  Strict ins and outs.  Daily weights. Chest x-ray done on 4/30 showed stable bilateral lung opacities without any  worsening.  Chest pain As discussed above patient had chest pain overnight.  Troponins x1.  We will repeat another troponin this afternoon.  EKG did not show any ischemic changes.  We will put her on a PPI.  Atrial fibrillation with RVR Patient was placed on oral Cardizem.  Dose was increased yesterday.  Heart rate is much better controlled.  TSH noted to be low.  We will check free T4 and free T3.  Lovenox dose was increased to therapeutic yesterday.  Will need to transition to oral anticoagulation.  No bleeding noted so far.  Change to long-acting Cardizem tomorrow depending on heart rate trends over the next 24 hours.  Essential hypertension Due to soft blood pressure patient's hydrochlorothiazide, amlodipine and ACE inhibitor were discontinued.  Currently on Cardizem.  Blood pressure is stable.  History of hyperlipidemia Continue current medications.  Hyperglycemia/newly diagnosed diabetes mellitus HbA1c 7.0.   Patient without previous history of diagnosed diabetes.  She has been on a steroid taper recently.  All this could be due to just steroids.  She will need outpatient monitoring as well.  For now continue Lantus and SSI.  Plan to wean down steroids starting today.  Morbid obesity  -Body mass index is 48.67 kg/m.  Hypokalemia Replete potassium as she is on furosemide.  Magnesium 2.4.    Normocytic anemia Likely dilutional.  No evidence of blood loss.  Monitor closely.   Code Status : Full  Family Communication  :  Discussed with patient and daughter on a daily basis  Disposition Plan  :  Unclear for now  Barriers For Discharge :  Wait for further improvement in respiratory status and arrhythmia.  Continue to  mobilize.  Consults  :  None  Procedures  : None  DVT Prophylaxis  :  lovenox 140 mg every 12 hours  Medications:  Scheduled: . aspirin EC  81 mg Oral Daily  . atorvastatin  20 mg Oral q1800  . azithromycin  500 mg Oral Daily  .  beclomethasone  2 puff Inhalation BID  . diltiazem  60 mg Oral Q6H  . enoxaparin (LOVENOX) injection  140 mg Subcutaneous Q12H  . furosemide  60 mg Intravenous Daily  . guaiFENesin  1,200 mg Oral BID  . insulin aspart  0-20 Units Subcutaneous TID WC  . insulin aspart  0-5 Units Subcutaneous QHS  . insulin glargine  15 Units Subcutaneous Daily  . Ipratropium-Albuterol  2 puff Inhalation QID  . methylPREDNISolone (SOLU-MEDROL) injection  40 mg Intravenous Q12H  . pantoprazole  40 mg Oral Daily  . senna  1 tablet Oral BID  . vitamin C  500 mg Oral Daily  . zinc sulfate  220 mg Oral Daily   Continuous:  BLT:JQZESPQZRAQTM **OR** acetaminophen, chlorpheniramine-HYDROcodone, guaiFENesin-codeine, levalbuterol, magnesium hydroxide, ondansetron **OR** ondansetron (ZOFRAN) IV, sodium chloride, sodium phosphate, sorbitol   Objective:  Vital Signs  Vitals:   03/28/19 1919 03/28/19 2059 03/29/19 0500 03/29/19 0801  BP: 106/70 132/68 132/79 123/77  Pulse:  88  (!) 105  Resp:  19    Temp:  97.8 F (36.6 C) 98.6 F (37 C) 97.7 F (36.5 C)  TempSrc:  Oral Oral Oral  SpO2:  98%  100%  Weight:   (!) 141.7 kg   Height:        Intake/Output Summary (Last 24 hours) at 03/29/2019 1017 Last data filed at 03/29/2019 0600 Gross per 24 hour  Intake 480 ml  Output 1800 ml  Net -1320 ml   Filed Weights   03/23/19 1800 03/28/19 0923 03/29/19 0500  Weight: (!) 142 kg (!) 141.5 kg (!) 141.7 kg   General appearance: Awake alert.  In no distress.  Morbidly obese Resp: Improved air entry bilaterally.  Mildly tachypneic.  Coarse breath sounds.  No wheezing heard today.   Cardio: S1-S2 is irregularly irregular.  Not as tachycardic as yesterday.  Telemetry shows atrial fibrillation. HR has been in the 80-100 range GI: Abdomen is soft.  Nontender nondistended.  Bowel sounds are present normal.  No masses organomegaly Extremities: No edema.  Full range of motion of lower extremities. Neurologic: Alert and  oriented x3.  No focal neurological deficits.    Lab Results:  Data Reviewed: I have personally reviewed following labs and imaging studies  CBC: Recent Labs  Lab 03/23/19 1303  03/25/19 0505 03/26/19 0455 03/27/19 0500 03/28/19 0535 03/28/19 2342  WBC 9.2   < > 4.7 7.4 10.2 8.8 8.9  NEUTROABS 8.1*  --   --   --   --   --   --   HGB 10.6*   < > 10.5* 10.4* 10.6* 10.3* 11.3*  HCT 31.9*   < > 32.3* 32.2* 31.1* 31.9* 34.0*  MCV 82.9   < > 82.2 81.5 81.2 82.4 81.3  PLT 263   < > 334 403* 421* 416* 437*   < > = values in this interval not displayed.    Basic Metabolic Panel: Recent Labs  Lab 03/25/19 0505 03/26/19 0455 03/27/19 0500 03/28/19 0535 03/28/19 2342  NA 137 137 138 139 137  K 4.7 4.4 4.0 4.2 3.6  CL 97* 97* 98 99 96*  CO2 30 28 30  31  32  GLUCOSE 250* 219* 205* 181* 320*  BUN 30* 47* 54* 48* 37*  CREATININE 1.06* 1.28* 1.15* 1.03* 1.04*  CALCIUM 8.9 9.1 9.2 9.3 9.2  MG 1.9  --   --  2.4  --     GFR: Estimated Creatinine Clearance: 82 mL/min (A) (by C-G formula based on SCr of 1.04 mg/dL (H)).  Liver Function Tests: Recent Labs  Lab 03/23/19 1303 03/26/19 0455 03/27/19 0500 03/28/19 2342  AST 26 18 16 23   ALT 23 18 18 26   ALKPHOS 104 101 97 107  BILITOT 0.7 0.5 0.4 0.5  PROT 7.5 7.4 7.4 7.4  ALBUMIN 3.5 3.2* 3.3* 3.3*    Cardiac Enzymes: Recent Labs  Lab 03/23/19 1303 03/28/19 2342  TROPONINI <0.03 <0.03    HbA1C: Recent Labs    03/27/19 0500  HGBA1C 7.0*    CBG: Recent Labs  Lab 03/28/19 0737 03/28/19 1207 03/28/19 1634 03/28/19 2057 03/29/19 0756  GLUCAP 161* 193* 263* 377* 208*    Anemia Panel: Recent Labs    03/27/19 0500 03/28/19 0535  FERRITIN 173 161    Recent Results (from the past 240 hour(s))  SARS Coronavirus 2 [facility] order, Performed in [facility] hospital lab)     Status: Abnormal   Collection Time: 03/23/19  1:03 PM  Result Value Ref Range Status   SARS Coronavirus 2 POSITIVE (A) NEGATIVE Final     Comment: RESULT CALLED TO, READ BACK BY AND VERIFIED WITH: P DOWD,RN 03/23/19 1457 RHOLMES (NOTE) If result is NEGATIVE SARS-CoV-2 target nucleic acids are NOT DETECTED. The SARS-CoV-2 RNA is generally detectable in upper and lower  respiratory specimens during the acute phase of infection. The lowest  concentration of SARS-CoV-2 viral copies this assay can detect is 250  copies / mL. A negative result does not preclude SARS-CoV-2 infection  and should not be used as the sole basis for treatment or other  patient management decisions.  A negative result may occur with  improper specimen collection / handling, submission of specimen other  than nasopharyngeal swab, presence of viral mutation(s) within the  areas targeted by this assay, and inadequate number of viral copies  (<250 copies / mL). A negative result must be combined with clinical  observations, patient history, and epidemiological information. If result is POSITIVE SARS-CoV-2 target nucleic acids are DETECTED. The SA RS-CoV-2 RNA is generally detectable in upper and lower  respiratory specimens during the acute phase of infection.  Positive  results are indicative of active infection with SARS-CoV-2.  Clinical  correlation with patient history and other diagnostic information is  necessary to determine patient infection status.  Positive results do  not rule out bacterial infection or co-infection with other viruses. If result is PRESUMPTIVE POSTIVE SARS-CoV-2 nucleic acids MAY BE PRESENT.   A presumptive positive result was obtained on the submitted specimen  and confirmed on repeat testing.  While 2019 novel coronavirus  (SARS-CoV-2) nucleic acids may be present in the submitted sample  additional confirmatory testing may be necessary for epidemiological  and / or clinical management purposes  to differentiate between  SARS-CoV-2 and other Sarbecovirus currently known to infect humans.  If clinically indicated  additional testing with an alternate test  methodology [phone]) is advi sed. The SARS-CoV-2 RNA is generally  detectable in upper and lower respiratory specimens during the acute  phase of infection. The expected result is Negative. Fact Sheet for Patients:  StrictlyIdeas.no Fact Sheet for Healthcare Providers: BankingDealers.co.za This test is not  yet approved or cleared by the [place] and has been authorized for detection and/or diagnosis of SARS-CoV-2 by FDA under an Emergency Use Authorization (EUA).  This EUA will remain in effect (meaning this test can be used) for the duration of the COVID-19 declaration under Section 564(b)(1) of the Act, 21 U.S.C. section 360bbb-3(b)(1), unless the authorization is terminated or revoked sooner. Performed at [facility], [place] [address] [place], [place] [address]       Radiology Studies: Dg Chest Port 1 View  Result Date: 03/28/2019 CLINICAL DATA:  Pneumonia. EXAM: PORTABLE CHEST 1 VIEW COMPARISON:  Radiograph of March 25, 2019. FINDINGS: Stable cardiomegaly. No pneumothorax or pleural effusion is noted. Stable bilateral lung opacities are noted, right greater than left, concerning for pneumonia. Bony thorax is unremarkable. IMPRESSION: Stable bilateral lung opacities are noted concerning, right greater than left. Electronically Signed   By: Marijo Conception M.D.   On: 03/28/2019 07:42       LOS: 6 days   [facility] Pager on [email or web address]  03/29/2019, 10:17 AM

## 2019-03-30 LAB — BASIC METABOLIC PANEL
Anion gap: 11 (ref 5–15)
BUN: 33 mg/dL — ABNORMAL HIGH (ref 8–23)
CO2: 32 mmol/L (ref 22–32)
Calcium: 9.5 mg/dL (ref 8.9–10.3)
Chloride: 98 mmol/L (ref 98–111)
Creatinine, Ser: 0.94 mg/dL (ref 0.44–1.00)
GFR calc Af Amer: >60 mL/min (ref >60)
GFR calc non Af Amer: >60 mL/min (ref >60)
Glucose, Bld: 187 mg/dL — ABNORMAL HIGH (ref 70–99)
Potassium: 4.1 mmol/L (ref 3.5–5.1)
Sodium: 141 mmol/L (ref 135–145)

## 2019-03-30 LAB — C-REACTIVE PROTEIN: CRP: 1.5 mg/dL — ABNORMAL HIGH (ref <1.0)

## 2019-03-30 LAB — CBC
HCT: 35.3 % — ABNORMAL LOW (ref 36.0–46.0)
Hemoglobin: 11.5 g/dL — ABNORMAL LOW (ref 12.0–15.0)
MCH: 26.9 pg (ref 26.0–34.0)
MCHC: 32.6 g/dL (ref 30.0–36.0)
MCV: 82.7 fL (ref 80.0–100.0)
Platelets: 480 10*3/uL — ABNORMAL HIGH (ref 150–400)
RBC: 4.27 MIL/uL (ref 3.87–5.11)
RDW: 13.2 % (ref 11.5–15.5)
WBC: 13.6 10*3/uL — ABNORMAL HIGH (ref 4.0–10.5)
nRBC: 0 % (ref 0.0–0.2)

## 2019-03-30 LAB — GLUCOSE, CAPILLARY
Glucose-Capillary: 196 mg/dL — ABNORMAL HIGH (ref 70–99)
Glucose-Capillary: 290 mg/dL — ABNORMAL HIGH (ref 70–99)
Glucose-Capillary: 310 mg/dL — ABNORMAL HIGH (ref 70–99)
Glucose-Capillary: 244 mg/dL — ABNORMAL HIGH (ref 70–99)

## 2019-03-30 LAB — FERRITIN: Ferritin: 192 ng/mL (ref 11–307)

## 2019-03-30 MED ORDER — DILTIAZEM HCL 25 MG/5ML IV SOLN: 10.0000 mg | Freq: Once | INTRAVENOUS | Status: DC

## 2019-03-30 MED ORDER — POTASSIUM CHLORIDE CRYS ER 20 MEQ PO TBCR
20.0000 meq | EXTENDED_RELEASE_TABLET | Freq: Once | ORAL | Status: AC
  Administered 2019-03-30: 20 meq via ORAL
  Filled 2019-03-30: qty 1

## 2019-03-30 MED ORDER — DILTIAZEM HCL-DEXTROSE 100-5 MG/100ML-% IV SOLN (PREMIX)
10.0000 mg | Freq: Once | INTRAVENOUS | Status: AC
  Administered 2019-03-30: 10 mg via INTRAVENOUS
  Filled 2019-03-30: qty 10

## 2019-03-30 MED ORDER — DILTIAZEM HCL ER COATED BEADS 300 MG PO CP24
300.0000 mg | ORAL_CAPSULE | Freq: Every day | ORAL | Status: DC
  Administered 2019-03-30 – 2019-03-31 (×2): 300 mg via ORAL
  Filled 2019-03-30 (×2): qty 1

## 2019-03-30 MED ORDER — DILTIAZEM HCL-DEXTROSE 100-5 MG/100ML-% IV SOLN (PREMIX)
10.0000 mg | Freq: Once | INTRAVENOUS | Status: DC
  Filled 2019-03-30: qty 100

## 2019-03-30 NOTE — Progress Notes (Signed)
notified MD pt HR sustaining 130-150 pt respondent  states she feels "different and dizzy. transferred back to bed. BP 156/102. New orders received.

## 2019-03-30 NOTE — Progress Notes (Signed)
SATURATION QUALIFICATIONS: (This note is used to comply with regulatory documentation for home oxygen)  Patient Saturations on Room Air at Rest = 96%  Patient Saturations on Room Air while Ambulating = 93%  Pt is not requiring oxygen therapy at this time.

## 2019-03-30 NOTE — Progress Notes (Signed)
Pt HR currently in the 90s and low 110s. Pt is still comfortable.

## 2019-03-30 NOTE — Progress Notes (Signed)
Pt HR in the 150-160s while at rest. MD notified and new orders placed. Pt is asymptomatic.

## 2019-03-30 NOTE — Progress Notes (Signed)
Physical Therapy Treatment Patient Details Name: Kayla Roberts MRN: [id number] DOB: [birth year] Today's Date: 03/30/2019    History of Present Illness 63 y.o. female with past medical history of hypertension, hyperlipidemia, morbid obesity, GERD, depression, anxiety, asthma. admitted to [facility] with pna d/t covid 19; pt went into abib with RVR 4/30, on oral cardizem    PT Comments    Pt progressing slowy,  mobility limited d/t incr HR/tachy with activity althoough only requiring min assist for transfers; may need HHPT at d/c; continue PT POC  Follow Up Recommendations  Home health PT     Equipment Recommendations  Rolling walker with 5" wheels;3in1 (PT)    Recommendations for Other Services       Precautions / Restrictions Precautions Precaution Comments: monitor VS--pt went into afib with RVR 4/30; on long acting oral cardizem; remains tachy  Restrictions Weight Bearing Restrictions: No    Mobility  Bed Mobility Overal bed mobility: Needs Assistance Bed Mobility: Supine to Sit     Supine to sit: Supervision;HOB elevated     General bed mobility comments: for safety, incr time and effort   Transfers Overall transfer level: Needs assistance Equipment used: None Transfers: Sit to/from [facility] Sit to Stand: Min guard Stand pivot transfers: Min assist;Min guard       General transfer comment: min/guard to min for safe transition to stand, incr time needed  Ambulation/Gait             General Gait Details: deferred d/t HR    Stairs             Wheelchair Mobility    Modified Rankin (Stroke Patients Only)       Balance     Sitting balance-Leahy Scale: Good       Standing balance-Leahy Scale: Fair Standing balance comment: reliant on UEs for dynamic balance                            Cognition Arousal/Alertness: Awake/alert Behavior During Therapy: WFL for tasks assessed/performed Overall Cognitive Status:  Within Functional Limits for tasks assessed                                 General Comments: pt upset about being stuck 5x for attmepted IV access, tearful      Exercises      General Comments General comments (skin integrity, edema, etc.): pt with HR max 152 with transfer bed to chair; extremely tachy; SpO2 in upper 90s on 1L, O2 removed with RN present in attempt to wean to RA      Pertinent Vitals/Pain Pain Assessment: No/denies pain    Home Living                      Prior Function            PT Goals (current goals can now be found in the care plan section) Acute Rehab PT Goals Patient Stated Goal: home soon PT Goal Formulation: With patient Time For Goal Achievement: 04/08/19 Potential to Achieve Goals: Good Progress towards PT goals: Progressing toward goals    Frequency    Min 4X/week      PT Plan Current plan remains appropriate;Discharge plan needs to be updated    Co-evaluation              AM-PAC PT "6 Clicks" Mobility  Outcome Measure  Help needed turning from your back to your side while in a flat bed without using bedrails?: A Little Help needed moving from lying on your back to sitting on the side of a flat bed without using bedrails?: A Little Help needed moving to and from a bed to a chair (including a wheelchair)?: A Little Help needed standing up from a chair using your arms (e.g., wheelchair or bedside chair)?: A Little Help needed to walk in hospital room?: A Lot Help needed climbing 3-5 steps with a railing? : A Lot 6 Click Score: 16    End of Session   Activity Tolerance: Treatment limited secondary to medical complications (Comment)(HR) Patient left: in chair;with call bell/phone within reach;with nursing/sitter in room Nurse Communication: Mobility status PT Visit Diagnosis: Unsteadiness on feet (R26.81)     Time: 7897-8478 PT Time Calculation (min) (ACUTE ONLY): 23 min  Charges:  $Therapeutic  Activity: 23-37 mins                     Kenyon Ana, PT  Pager: [phone] Acute Rehab Dept [facility]): [phone]   03/30/2019    [facility] 03/30/2019, 4:12 PM

## 2019-03-30 NOTE — Progress Notes (Signed)
PROGRESS NOTE  Kayla Roberts CHE:[id number] DOB: 1956-05-22 DOA: 03/23/2019  PCP: Velna Ochs, MD  Brief History/Interval Summary:  63 y.o.femalewith past medical history of hypertension, hyperlipidemia, morbid obesity, GERD, depression, anxiety, asthma, presented from home with complaint of worsening shortness of breath, fever, cough and lethargy for last 1 week. In ED, patient was saturating 90% on 2 L nasal cannula, chest x-ray with worsening multilobar pneumonia, COVID-19 test came back positive.  Consultants: None  Procedures: None  Antibiotics: Azithromycin  Subjective/Interval History: Patient states that she is feeling much better this morning.  No further episodes of chest pain.  Shortness of breath is improved.  No nausea vomiting.  Remains anxious.      Assessment/Plan:  Acute Hypoxic Resp. Failure due to Acute Covid 19 Viral Illness during the ongoing 2020 Covid 19 Pandemic  CRP was 20.2--23.6--14.4--8.3-4.3-3.1-1.5 -D-dimer 1.84--1.63--1.37--1.22- 0.98-1.14  Patient remains afebrile.  No fever in the last 3 days. Respiratory status is stable.  She is now saturating in the mid 90s on 2 L of oxygen by nasal cannula. Will need home oxygen assessment. Wheezing appears to have improved.  Continue to wean down steroids.  Continue Xopenex inhalers.  Incentive spirometry. Inflammatory markers have improved. Complete course of azithromycin. Patient did not require experimental therapy such as Actemra. She was given Lasix with good diuresis.  Since respiratory status has improved hold off on further doses for now. Chest x-ray done on 4/30 showed stable bilateral lung opacities without any worsening.  Atypical chest pain Chest pain probably due to acid reflux.  Troponin levels were normal x2.  EKG did not show any ischemic changes.  Continue twice daily PPI.    Atrial fibrillation with RVR Patient is heart rate was elevated yesterday afternoon.  Dose of the short  acting Cardizem was increased.  Heart rate better controlled over the last 12 hours.  We will change her to long-acting Cardizem today. TSH noted to be low at less than 0.01.  Free T4 and free T3 levels have been ordered and are pending as of this morning.  Continue therapeutic Lovenox.  Will eventually need to transition to oral anticoagulation.  No bleeding noted so far.  She will also need echocardiogram in the outpatient setting.  Essential hypertension Due to soft blood pressures initially patient's hydrochlorothiazide, amlodipine and ACE inhibitor were discontinued.  Currently on Cardizem.  Blood pressure is stable.  History of hyperlipidemia Continue current medications.  Hyperglycemia/newly diagnosed diabetes mellitus HbA1c 7.0.   Patient without previous history of diagnosed diabetes.  She has been on a steroid taper recently.  All this could be due to just steroids.  She will need outpatient monitoring as well.  For now continue Lantus and SSI.  Patient was changed over to oral prednisone yesterday.  Continue to wean down.  CBGs have improved.  Will need further management of this in the outpatient setting.  Morbid obesity  Body mass index is 48.67 kg/m.  Hypokalemia Potassium level is normal this morning.    Normocytic anemia Likely dilutional.  No evidence of blood loss.  Monitor closely.   Code Status : Full  Family Communication  :  Discussed with patient and daughter on a daily basis  Disposition Plan  :  Seen by PT.  She will need home health.  Might need home oxygen.  Barriers For Discharge :  Wait for further improvement in heart rate control.  Home O2 assessment.  Anticipate discharge in the next 24 to 48 hours.  Consults  :  None  Procedures  : None  DVT Prophylaxis  :  lovenox 140 mg every 12 hours  Medications:  Scheduled: . aspirin EC  81 mg Oral Daily  . atorvastatin  20 mg Oral q1800  . azithromycin  500 mg Oral Daily  .  beclomethasone  2 puff Inhalation BID  . diltiazem  300 mg Oral Daily  . enoxaparin (LOVENOX) injection  140 mg Subcutaneous Q12H  . guaiFENesin  1,200 mg Oral BID  . insulin aspart  0-20 Units Subcutaneous TID WC  . insulin aspart  0-5 Units Subcutaneous QHS  . insulin glargine  15 Units Subcutaneous Daily  . Ipratropium-Albuterol  2 puff Inhalation QID  . pantoprazole  40 mg Oral BID  . predniSONE  40 mg Oral BID WC  . senna  1 tablet Oral BID  . vitamin C  500 mg Oral Daily  . zinc sulfate  220 mg Oral Daily   Continuous:  PJA:[id number] **OR** acetaminophen, chlorpheniramine-HYDROcodone, guaiFENesin-codeine, levalbuterol, magnesium hydroxide, ondansetron **OR** ondansetron (ZOFRAN) IV, sodium chloride, sodium phosphate, sorbitol   Objective:  Vital Signs  Vitals:   03/29/19 1528 03/29/19 1738 03/29/19 2100 03/30/19 0500  BP:  130/81    Pulse:      Resp:      Temp: 97.8 F (36.6 C)  97.6 F (36.4 C) 98.3 F (36.8 C)  TempSrc: Oral  Oral Oral  SpO2:      Weight:    (!) 142.8 kg  Height:        Intake/Output Summary (Last 24 hours) at 03/30/2019 0948 Last data filed at 03/30/2019 0839 Gross per 24 hour  Intake 2100 ml  Output 3151 ml  Net -1051 ml   Filed Weights   03/28/19 0923 03/29/19 0500 03/30/19 0500  Weight: (!) 141.5 kg (!) 141.7 kg (!) 142.8 kg   General appearance: Awake alert.  In no distress.  Morbidly obese  Resp: Normal effort at rest.  Scattered wheezes bilaterally.  Improved from before.  Few crackles at the bases.  No rhonchi.   Cardio: S1-S2 is irregularly irregular.  No S3-S4.  No rubs murmurs or bruit.  Telemetry shows atrial fibrillation with heart rates between 90-110.  GI: Abdomen is soft.  Nontender nondistended.  Bowel sounds are present normal.  No masses organomegaly Extremities: No edema.  Full range of motion of lower extremities. Neurologic: Alert and oriented x3.  No focal neurological deficits.    Lab Results:  Data Reviewed:  I have personally reviewed following labs and imaging studies  CBC: Recent Labs  Lab 03/23/19 1303  03/26/19 0455 03/27/19 0500 03/28/19 0535 03/28/19 2342 03/30/19 0500  WBC 9.2   < > 7.4 10.2 8.8 8.9 13.6*  NEUTROABS 8.1*  --   --   --   --   --   --   HGB 10.6*   < > 10.4* 10.6* 10.3* 11.3* 11.5*  HCT 31.9*   < > 32.2* 31.1* 31.9* 34.0* 35.3*  MCV 82.9   < > 81.5 81.2 82.4 81.3 82.7  PLT 263   < > 403* 421* 416* 437* 480*   < > = values in this interval not displayed.    Basic Metabolic Panel: Recent Labs  Lab 03/25/19 0505 03/26/19 0455 03/27/19 0500 03/28/19 0535 03/28/19 2342 03/30/19 0500  NA 137 137 138 139 137 141  K 4.7 4.4 4.0 4.2 3.6 4.1  CL 97* 97* 98 99 96* 98  CO2 30 28 30  31 32 32  GLUCOSE 250* 219* 205* 181* 320* 187*  BUN 30* 47* 54* 48* 37* 33*  CREATININE 1.06* 1.28* 1.15* 1.03* 1.04* 0.94  CALCIUM 8.9 9.1 9.2 9.3 9.2 9.5  MG 1.9  --   --  2.4  --   --     GFR: Estimated Creatinine Clearance: 91.2 mL/min (by C-G formula based on SCr of 0.94 mg/dL).  Liver Function Tests: Recent Labs  Lab 03/23/19 1303 03/26/19 0455 03/27/19 0500 03/28/19 2342  AST 26 18 16 23   ALT 23 18 18 26   ALKPHOS 104 101 97 107  BILITOT 0.7 0.5 0.4 0.5  PROT 7.5 7.4 7.4 7.4  ALBUMIN 3.5 3.2* 3.3* 3.3*    Cardiac Enzymes: Recent Labs  Lab 03/23/19 1303 03/28/19 2342 03/29/19 1200  TROPONINI <0.03 <0.03 <0.03    CBG: Recent Labs  Lab 03/29/19 0756 03/29/19 1148 03/29/19 1617 03/29/19 2048 03/30/19 0752  GLUCAP 208* 361* 350* 316* 196*    Anemia Panel: Recent Labs    03/28/19 0535 03/30/19 0500  FERRITIN 161 192    Recent Results (from the past 240 hour(s))  SARS Coronavirus 2 [facility] order, Performed in [place] hospital lab)     Status: Abnormal   Collection Time: 03/23/19  1:03 PM  Result Value Ref Range Status   SARS Coronavirus 2 POSITIVE (A) NEGATIVE Final    Comment: RESULT CALLED TO, READ BACK BY AND VERIFIED WITH: P DOWD,RN  03/23/19 1457 RHOLMES (NOTE) If result is NEGATIVE SARS-CoV-2 target nucleic acids are NOT DETECTED. The SARS-CoV-2 RNA is generally detectable in upper and lower  respiratory specimens during the acute phase of infection. The lowest  concentration of SARS-CoV-2 viral copies this assay can detect is 250  copies / mL. A negative result does not preclude SARS-CoV-2 infection  and should not be used as the sole basis for treatment or other  patient management decisions.  A negative result may occur with  improper specimen collection / handling, submission of specimen other  than nasopharyngeal swab, presence of viral mutation(s) within the  areas targeted by this assay, and inadequate number of viral copies  (<250 copies / mL). A negative result must be combined with clinical  observations, patient history, and epidemiological information. If result is POSITIVE SARS-CoV-2 target nucleic acids are DETECTED. The SA RS-CoV-2 RNA is generally detectable in upper and lower  respiratory specimens during the acute phase of infection.  Positive  results are indicative of active infection with SARS-CoV-2.  Clinical  correlation with patient history and other diagnostic information is  necessary to determine patient infection status.  Positive results do  not rule out bacterial infection or co-infection with other viruses. If result is PRESUMPTIVE POSTIVE SARS-CoV-2 nucleic acids MAY BE PRESENT.   A presumptive positive result was obtained on the submitted specimen  and confirmed on repeat testing.  While 2019 novel coronavirus  (SARS-CoV-2) nucleic acids may be present in the submitted sample  additional confirmatory testing may be necessary for epidemiological  and / or clinical management purposes  to differentiate between  SARS-CoV-2 and other Sarbecovirus currently known to infect humans.  If clinically indicated additional testing with an alternate test  methodology [phone]) is advi sed.  The SARS-CoV-2 RNA is generally  detectable in upper and lower respiratory specimens during the acute  phase of infection. The expected result is Negative. Fact Sheet for Patients:  StrictlyIdeas.no Fact Sheet for Healthcare Providers: BankingDealers.co.za This test is not yet approved or cleared  by the [place] and has been authorized for detection and/or diagnosis of SARS-CoV-2 by FDA under an Emergency Use Authorization (EUA).  This EUA will remain in effect (meaning this test can be used) for the duration of the COVID-19 declaration under Section 564(b)(1) of the Act, 21 U.S.C. section 360bbb-3(b)(1), unless the authorization is terminated or revoked sooner. Performed at [facility], [place] [address], [place], [place] [address]       Radiology Studies: No results found.     LOS: 7 days   [place] Hedman [employer] on [email or web address]  03/30/2019, 9:48 AM

## 2019-03-31 LAB — BASIC METABOLIC PANEL
Anion gap: 9 (ref 5–15)
BUN: 38 mg/dL — ABNORMAL HIGH (ref 8–23)
CO2: 32 mmol/L (ref 22–32)
Calcium: 9.1 mg/dL (ref 8.9–10.3)
Chloride: 96 mmol/L — ABNORMAL LOW (ref 98–111)
Creatinine, Ser: 1.05 mg/dL — ABNORMAL HIGH (ref 0.44–1.00)
GFR calc Af Amer: >60 mL/min (ref >60)
GFR calc non Af Amer: 56 mL/min — ABNORMAL LOW (ref >60)
Glucose, Bld: 213 mg/dL — ABNORMAL HIGH (ref 70–99)
Potassium: 4.3 mmol/L (ref 3.5–5.1)
Sodium: 137 mmol/L (ref 135–145)

## 2019-03-31 LAB — MAGNESIUM: Magnesium: 2.2 mg/dL (ref 1.7–2.4)

## 2019-03-31 LAB — T3, FREE: T3, Free: 2.8 pg/mL (ref 2.0–4.4)

## 2019-03-31 LAB — GLUCOSE, CAPILLARY
Glucose-Capillary: 221 mg/dL — ABNORMAL HIGH (ref 70–99)
Glucose-Capillary: 375 mg/dL — ABNORMAL HIGH (ref 70–99)
Glucose-Capillary: 283 mg/dL — ABNORMAL HIGH (ref 70–99)

## 2019-03-31 MED ORDER — LIVING WELL WITH DIABETES BOOK
Freq: Once | Status: AC
  Administered 2019-04-01: 10:00:00
  Filled 2019-03-31: qty 1

## 2019-03-31 MED ORDER — IPRATROPIUM-ALBUTEROL 20-100 MCG/ACT IN AERS
2.0000 | INHALATION_SPRAY | Freq: Two times a day (BID) | RESPIRATORY_TRACT | Status: DC
  Administered 2019-03-31 – 2019-04-01 (×4): 2 via RESPIRATORY_TRACT
  Filled 2019-03-31: qty 4

## 2019-03-31 MED ORDER — PREDNISONE 10 MG PO TABS
40.0000 mg | ORAL_TABLET | Freq: Every day | ORAL | Status: DC
  Administered 2019-04-01: 40 mg via ORAL
  Filled 2019-03-31: qty 4

## 2019-03-31 MED ORDER — INSULIN STARTER KIT- PEN NEEDLES (ENGLISH)
1.0000 | Freq: Once | Status: DC
  Filled 2019-03-31: qty 1

## 2019-03-31 MED ORDER — APIXABAN 5 MG PO TABS
5.0000 mg | ORAL_TABLET | Freq: Two times a day (BID) | ORAL | Status: DC
  Administered 2019-03-31 – 2019-04-03 (×6): 5 mg via ORAL
  Filled 2019-03-31 (×6): qty 1

## 2019-03-31 MED ORDER — DILTIAZEM HCL ER COATED BEADS 180 MG PO CP24
360.0000 mg | ORAL_CAPSULE | Freq: Every day | ORAL | Status: DC
  Administered 2019-04-01 – 2019-04-03 (×3): 360 mg via ORAL
  Filled 2019-03-31 (×4): qty 2

## 2019-03-31 NOTE — Progress Notes (Signed)
PROGRESS NOTE        PATIENT DETAILS Name: Kayla Roberts Age: 63 y.o. Sex: female Date of Birth: 1956/05/11 Admit Date: 03/23/2019 Admitting Physician Terrilee Croak, MD [id number], Hoyle Sauer, MD  Brief Narrative: Patient is a 63 y.o. female with PMHx of HTN, dyslipidemia, GERD, depression, anxiety who presented with fever, cough and shortness of breath-was found to have acute hypoxic respiratory failure in the setting of COVID 19 viral pneumonia.  Hospital course complicated by development of A. fib with RVR.  Subjective: Awake-alert-feeling better-still occasionally gets tachycardic.  On room air this morning.  Assessment/Plan: Acute hypoxic respiratory failure secondary to COVID-19 viral pneumonia: Better-titrated down to room air today.  Continue Zithromax-change prednisone to daily dosing.  A. fib with RVR: Rate still not optimal-in the 120s this morning-increase long-acting Cardizem to 360 mg daily.  Will transition from Lovenox to Eliquis.  Patient will need outpatient follow-up with cardiology.  Newly diagnosed DM-2: CBGs on the higher side-but patient on steroids.  A1c 7.0.  Continue Lantus and SSI.  Hypertension: Blood pressure appears reasonable with Cardizem.  Morbid obesity   DVT Prophylaxis: Full dose anticoagulation with Lovenox  Code Status: Full code   Family Communication: None at bedside  Disposition Plan: Remain inpatient-hopefully home tomorrow if clinical improvement continues  Antimicrobial agents: Anti-infectives (From admission, onward)   Start     Dose/Rate Route Frequency Ordered Stop   03/27/19 1100  azithromycin (ZITHROMAX) tablet 500 mg     500 mg Oral Daily 03/27/19 1046 03/31/19 0958      Procedures: None  CONSULTS:  None  Time spent: 25- minutes-Greater than 50% of this time was spent in counseling, explanation of diagnosis, planning of further management, and coordination of care.  MEDICATIONS:  Scheduled Meds: . aspirin EC  81 mg Oral Daily  . atorvastatin  20 mg Oral q1800  . beclomethasone  2 puff Inhalation BID  . diltiazem  300 mg Oral Daily  . enoxaparin (LOVENOX) injection  140 mg Subcutaneous Q12H  . guaiFENesin  1,200 mg Oral BID  . insulin aspart  0-20 Units Subcutaneous TID WC  . insulin aspart  0-5 Units Subcutaneous QHS  . insulin glargine  15 Units Subcutaneous Daily  . Ipratropium-Albuterol  2 puff Inhalation BID  . pantoprazole  40 mg Oral BID  . predniSONE  40 mg Oral BID WC  . senna  1 tablet Oral BID  . vitamin C  500 mg Oral Daily  . zinc sulfate  220 mg Oral Daily   Continuous Infusions: PRN Meds:.acetaminophen **OR** acetaminophen, chlorpheniramine-HYDROcodone, guaiFENesin-codeine, levalbuterol, magnesium hydroxide, ondansetron **OR** ondansetron (ZOFRAN) IV, sodium chloride, sodium phosphate, sorbitol   PHYSICAL EXAM: Vital signs: Vitals:   03/31/19 0703 03/31/19 0800 03/31/19 0959 03/31/19 1200  BP:   (!) 119/58   Pulse:      Resp:      Temp:  (!) 97.1 F (36.2 C)  98.4 F (36.9 C)  TempSrc:  Axillary  Oral  SpO2:      Weight: (!) 141 kg     Height:       Filed Weights   03/29/19 0500 03/30/19 0500 03/31/19 0703  Weight: (!) 141.7 kg (!) 142.8 kg (!) 141 kg   Body mass index is 47.26 kg/m.   General appearance :Awake, alert, not in any distress. HEENT: Atraumatic and Normocephalic Neck: supple Resp:Good  air entry bilaterally, no added sounds  CVS: S1 S2 irregular GI: Bowel sounds present, Non tender and not distended with no gaurding, rigidity or rebound.No organomegaly Extremities: B/L Lower Ext shows no edema, both legs are warm to touch Neurology:  speech clear,Non focal, sensation is grossly intact. Musculoskeletal:No digital cyanosis Skin:No Rash, warm and dry Wounds:N/A  I have personally reviewed following labs and imaging studies  LABORATORY DATA: CBC: Recent Labs  Lab 03/26/19 0455 03/27/19 0500 03/28/19 0535  03/28/19 2342 03/30/19 0500  WBC 7.4 10.2 8.8 8.9 13.6*  HGB 10.4* 10.6* 10.3* 11.3* 11.5*  HCT 32.2* 31.1* 31.9* 34.0* 35.3*  MCV 81.5 81.2 82.4 81.3 82.7  PLT 403* 421* 416* 437* 480*    Basic Metabolic Panel: Recent Labs  Lab 03/25/19 0505  03/27/19 0500 03/28/19 0535 03/28/19 2342 03/30/19 0500 03/31/19 0430  NA 137   < > 138 139 137 141 137  K 4.7   < > 4.0 4.2 3.6 4.1 4.3  CL 97*   < > 98 99 96* 98 96*  CO2 30   < > 30 31 32 32 32  GLUCOSE 250*   < > 205* 181* 320* 187* 213*  BUN 30*   < > 54* 48* 37* 33* 38*  CREATININE 1.06*   < > 1.15* 1.03* 1.04* 0.94 1.05*  CALCIUM 8.9   < > 9.2 9.3 9.2 9.5 9.1  MG 1.9  --   --  2.4  --   --  2.2   < > = values in this interval not displayed.    GFR: Estimated Creatinine Clearance: 80.9 mL/min (A) (by C-G formula based on SCr of 1.05 mg/dL (H)).  Liver Function Tests: Recent Labs  Lab 03/26/19 0455 03/27/19 0500 03/28/19 2342  AST 18 16 23   ALT 18 18 26   ALKPHOS 101 97 107  BILITOT 0.5 0.4 0.5  PROT 7.4 7.4 7.4  ALBUMIN 3.2* 3.3* 3.3*   No results for input(s): LIPASE, AMYLASE in the last 168 hours. No results for input(s): AMMONIA in the last 168 hours.  Coagulation Profile: No results for input(s): INR, PROTIME in the last 168 hours.  Cardiac Enzymes: Recent Labs  Lab 03/28/19 2342 03/29/19 1200  TROPONINI <0.03 <0.03    BNP (last 3 results) No results for input(s): PROBNP in the last 8760 hours.  HbA1C: No results for input(s): HGBA1C in the last 72 hours.  CBG: Recent Labs  Lab 03/30/19 1111 03/30/19 1646 03/30/19 2216 03/31/19 0807 03/31/19 1144  GLUCAP 290* 310* 244* 221* 375*    Lipid Profile: No results for input(s): CHOL, HDL, LDLCALC, TRIG, CHOLHDL, LDLDIRECT in the last 72 hours.  Thyroid Function Tests: Recent Labs    03/28/19 2342 03/30/19 0500  TSH <0.010*  --   T3FREE  --  2.8    Anemia Panel: Recent Labs    03/30/19 0500  FERRITIN 192    Urine analysis: No  results found for: COLORURINE, APPEARANCEUR, LABSPEC, PHURINE, GLUCOSEU, HGBUR, BILIRUBINUR, KETONESUR, PROTEINUR, UROBILINOGEN, NITRITE, LEUKOCYTESUR  Sepsis Labs: Lactic Acid, Venous No results found for: LATICACIDVEN  MICROBIOLOGY: Recent Results (from the past 240 hour(s))  SARS Coronavirus 2 [facility] order, Performed in [place] hospital lab)     Status: Abnormal   Collection Time: 03/23/19  1:03 PM  Result Value Ref Range Status   SARS Coronavirus 2 POSITIVE (A) NEGATIVE Final    Comment: RESULT CALLED TO, READ BACK BY AND VERIFIED WITH: P DOWD,RN 03/23/19 1457 RHOLMES (NOTE) If  result is NEGATIVE SARS-CoV-2 target nucleic acids are NOT DETECTED. The SARS-CoV-2 RNA is generally detectable in upper and lower  respiratory specimens during the acute phase of infection. The lowest  concentration of SARS-CoV-2 viral copies this assay can detect is 250  copies / mL. A negative result does not preclude SARS-CoV-2 infection  and should not be used as the sole basis for treatment or other  patient management decisions.  A negative result may occur with  improper specimen collection / handling, submission of specimen other  than nasopharyngeal swab, presence of viral mutation(s) within the  areas targeted by this assay, and inadequate number of viral copies  (<250 copies / mL). A negative result must be combined with clinical  observations, patient history, and epidemiological information. If result is POSITIVE SARS-CoV-2 target nucleic acids are DETECTED. The SA RS-CoV-2 RNA is generally detectable in upper and lower  respiratory specimens during the acute phase of infection.  Positive  results are indicative of active infection with SARS-CoV-2.  Clinical  correlation with patient history and other diagnostic information is  necessary to determine patient infection status.  Positive results do  not rule out bacterial infection or co-infection with other viruses. If result is  PRESUMPTIVE POSTIVE SARS-CoV-2 nucleic acids MAY BE PRESENT.   A presumptive positive result was obtained on the submitted specimen  and confirmed on repeat testing.  While 2019 novel coronavirus  (SARS-CoV-2) nucleic acids may be present in the submitted sample  additional confirmatory testing may be necessary for epidemiological  and / or clinical management purposes  to differentiate between  SARS-CoV-2 and other Sarbecovirus currently known to infect humans.  If clinically indicated additional testing with an alternate test  methodology [phone]) is advi sed. The SARS-CoV-2 RNA is generally  detectable in upper and lower respiratory specimens during the acute  phase of infection. The expected result is Negative. Fact Sheet for Patients:  StrictlyIdeas.no Fact Sheet for Healthcare Providers: BankingDealers.co.za This test is not yet approved or cleared by the [place] FDA and has been authorized for detection and/or diagnosis of SARS-CoV-2 by FDA under an Emergency Use Authorization (EUA).  This EUA will remain in effect (meaning this test can be used) for the duration of the COVID-19 declaration under Section 564(b)(1) of the Act, 21 U.S.C. section 360bbb-3(b)(1), unless the authorization is terminated or revoked sooner. Performed at [facility], [place] [address], [place], [place] [address]     RADIOLOGY STUDIES/RESULTS: Dg Chest Port 1 View  Result Date: 03/28/2019 CLINICAL DATA:  Pneumonia. EXAM: PORTABLE CHEST 1 VIEW COMPARISON:  Radiograph of March 25, 2019. FINDINGS: Stable cardiomegaly. No pneumothorax or pleural effusion is noted. Stable bilateral lung opacities are noted, right greater than left, concerning for pneumonia. Bony thorax is unremarkable. IMPRESSION: Stable bilateral lung opacities are noted concerning, right greater than left. Electronically Signed   By: Marijo Conception M.D.   On: 03/28/2019  07:42   Dg Chest Port 1 View  Result Date: 03/25/2019 CLINICAL DATA:  Hypoxia. EXAM: PORTABLE CHEST 1 VIEW COMPARISON:  None. FINDINGS: The heart size is normal. Bilateral interstitial and airspace disease is present. Lung volumes are low. IMPRESSION: 1. Low lung volumes with multi lobar interstitial and airspace disease concerning for infection/multi lobar pneumonia. Electronically Signed   By: San Morelle M.D.   On: 03/25/2019 10:10   Dg Chest Port 1 View  Result Date: 03/23/2019 CLINICAL DATA:  63 year old female with history of pneumonia. Worsening shortness of breath and  productive cough. EXAM: PORTABLE CHEST 1 VIEW COMPARISON:  Chest x-ray 03/18/2019. FINDINGS: Patchy multifocal ill-defined airspace disease throughout the lungs bilaterally, significantly increased compared to the prior examination, compatible with progressive multilobar pneumonia. No pleural effusions. No evidence of pulmonary edema. Heart size is mildly enlarged. The patient is rotated to the right on today's exam, resulting in distortion of the mediastinal contours and reduced diagnostic sensitivity and specificity for mediastinal pathology. IMPRESSION: 1. Worsening bilateral multilobar pneumonia, concerning for potential viral infection. Electronically Signed   By: Vinnie Langton M.D.   On: 03/23/2019 14:08   Dg Chest Portable 1 View  Result Date: 03/18/2019 CLINICAL DATA:  Cough and shortness of breath for the past 5 days. New onset fever today. EXAM: PORTABLE CHEST 1 VIEW COMPARISON:  Chest x-ray dated March 08, 2018. FINDINGS: Stable mild cardiomegaly. Normal mediastinal contours. Normal pulmonary vascularity. New small opacity in the left mid lung. Minimal bibasilar atelectasis. No focal consolidation, pleural effusion, or pneumothorax. Unchanged pleural thickening at the bilateral costophrenic angles. No acute osseous abnormality. IMPRESSION: 1. New small opacity in the left mid lung could reflect developing  pneumonia given clinical history. Followup PA and lateral chest X-ray is recommended in 3-4 weeks following trial of antibiotic therapy to ensure resolution. Electronically Signed   By: Titus Dubin M.D.   On: 03/18/2019 19:18     LOS: 8 days   Oren Binet, MD  [facility]  If 7PM-7AM, please contact night-coverage  Please page via [email or web address]  Go to [email or web address] and use [facility] universal password to access. If you do not have the password, please contact the hospital operator.  Locate the [facility] provider you are looking for under [facility] and page to a number that you can be directly reached. If you still have difficulty reaching the provider, please page the [facility] (Director on Call) for the Hospitalists listed on amion for assistance.  03/31/2019, 12:42 PM

## 2019-03-31 NOTE — Progress Notes (Signed)
ANTICOAGULATION CONSULT NOTE - Initial Consult  Pharmacy Consult for Apixaban Indication: atrial fibrillation  No Known Allergies  Patient Measurements: Height: 5\' 8"  (172.7 cm) Weight: (!) 310 lb 13.6 oz (141 kg) IBW/kg (Calculated) : 63.9  Vital Signs: Temp: 98.4 F (36.9 C) (05/03 1200) Temp Source: Oral (05/03 1200) BP: 119/58 (05/03 0959)  Labs: Recent Labs    03/28/19 2342 03/29/19 1200 03/30/19 0500 03/31/19 0430  HGB 11.3*  --  11.5*  --   HCT 34.0*  --  35.3*  --   PLT 437*  --  480*  --   CREATININE 1.04*  --  0.94 1.05*  TROPONINI <0.03 <0.03  --   --     Estimated Creatinine Clearance: 80.9 mL/min (A) (by C-G formula based on SCr of 1.05 mg/dL (H)).   Medical History: Past Medical History:  Diagnosis Date  . Anxiety   . Asthma   . Depression   . GERD (gastroesophageal reflux disease)   . Hyperlipidemia   . Hypertension   . Morbid obesity ([place])     Assessment: 83 YOF with new onset Afib on therapeutic anticoagulation with Lovenox. Plan is to transition patient to oral anticoagulation with apixaban. Last Lovenox dose this AM.    Plan:  - Will initiate apixaban 5 mg bid starting with the PM dose tonight.   - Monitor for s/s of bleeding - Apixaban education  Napoleon Form 03/31/2019,3:09 PM

## 2019-03-31 NOTE — Progress Notes (Signed)
ANTICOAGULATION CONSULT NOTE - Follow Up Consult  Pharmacy Consult for Lovenox Indication: atrial fibrillation  No Known Allergies  Patient Measurements: Height: 5\' 8"  (172.7 cm) Weight: (!) 310 lb 13.6 oz (141 kg) IBW/kg (Calculated) : 63.9  Vital Signs: Temp: 97.1 F (36.2 C) (05/03 0800) Temp Source: Axillary (05/03 0800) BP: 115/65 (05/03 0240)  Labs: Recent Labs    03/28/19 2342 03/29/19 1200 03/30/19 0500 03/31/19 0430  HGB 11.3*  --  11.5*  --   HCT 34.0*  --  35.3*  --   PLT 437*  --  480*  --   CREATININE 1.04*  --  0.94 1.05*  TROPONINI <0.03 <0.03  --   --     Estimated Creatinine Clearance: 80.9 mL/min (A) (by C-G formula based on SCr of 1.05 mg/dL (H)).   Medical History: Past Medical History:  Diagnosis Date  . Anxiety   . Asthma   . Depression   . GERD (gastroesophageal reflux disease)   . Hyperlipidemia   . Hypertension   . Morbid obesity ([place])     Medications:  Medications Prior to Admission  Medication Sig Dispense Refill Last Dose  . albuterol (PROVENTIL HFA;VENTOLIN HFA) 108 (90 Base) MCG/ACT inhaler INHALE 2 PUFFS INTO THE LUNGS EVERY 6 HOURS AS NEEDED FOR WHEEZING OR SHORTNESS OF BREATH (Patient taking differently: Inhale 2 puffs into the lungs every 6 (six) hours as needed for wheezing or shortness of breath. ) 18 g 1 03/23/2019 at Unknown time  . amLODipine (NORVASC) 5 MG tablet TAKE 1 TABLET(5 MG) BY MOUTH DAILY (Patient taking differently: Take 5 mg by mouth daily. ) 90 tablet 1 03/23/2019 at Unknown time  . aspirin 81 MG tablet Take 81 mg by mouth daily.    03/22/2019 at Unknown time  . atorvastatin (LIPITOR) 20 MG tablet Take 1 tablet (20 mg total) by mouth daily. 30 tablet 11 03/23/2019 at Unknown time  . [EXPIRED] doxycycline (VIBRAMYCIN) 100 MG capsule Take 1 capsule (100 mg total) by mouth 2 (two) times daily for 7 days. 14 capsule 0 03/23/2019 at Unknown time  . Flaxseed, Linseed, (FLAX SEEDS PO) Take 1 tablet by mouth daily.    03/22/2019 at Unknown time  . guaiFENesin-codeine 100-10 MG/5ML syrup Take 5 mLs by mouth at bedtime as needed for cough. 118 mL 0 Past Week at Unknown time  . hydrochlorothiazide (HYDRODIURIL) 25 MG tablet Take 25 mg by mouth daily.   03/23/2019 at Unknown time  . lisinopril (PRINIVIL,ZESTRIL) 20 MG tablet Take 1 tablet (20 mg total) by mouth daily. 30 tablet 2 03/23/2019 at Unknown time  . predniSONE (STERAPRED UNI-PAK 21 TAB) 10 MG (21) TBPK tablet Take by mouth daily. Take 6 tabs by mouth daily  for 2 days, then 5 tabs for 2 days, then 4 tabs for 2 days, then 3 tabs for 2 days, 2 tabs for 2 days, then 1 tab by mouth daily for 2 days 42 tablet 0 03/23/2019 at Unknown time  . QVAR REDIHALER 40 MCG/ACT inhaler INHALE 2 PUFFS INTO THE LUNGS TWICE DAILY 10.6 g 5 Past Week at Unknown time    Assessment: 40 YOF with new onset Afib on therapeutic anticoagulation with Lovenox. H/H low stable, Plt high. SCr 1.05   Goal of Therapy:  Anti-Xa level 0.6-1 units/ml 4hrs after LMWH dose given Monitor platelets by anticoagulation protocol: Yes   Plan:  -Lovenox 140 mg (1 mg/kg) twice daily.  -Monitory CBC and renal fx  -Monitor for any s/s of  bleeding  -F/u transition to oral anticoag   Albertina Parr, PharmD., BCPS Clinical Pharmacist Clinical phone for 03/31/19 until 4pm: [phone] If after 4pm, please refer to [facility] for unit-specific pharmacist

## 2019-03-31 NOTE — Progress Notes (Signed)
Inpatient Diabetes Program Recommendations  AACE/ADA: New Consensus Statement on Inpatient Glycemic Control (2015)  Target Ranges:  Prepandial:   less than 140 mg/dL      Peak postprandial:   less than 180 mg/dL (1-2 hours)      Critically ill patients:  140 - 180 mg/dL   Lab Results  Component Value Date   GLUCAP 283 (H) 03/31/2019   HGBA1C 7.0 (H) 03/27/2019    Review of Glycemic Control  Diabetes history: New-onset Outpatient Diabetes medications: None Current orders for Inpatient glycemic control: Lantus 15 QD, Novolog 0-20 units tidwc and hs  On Prednisone 40 mg with breakfast HgbA1C 7.0% indicating diagnosis of diabetes 70/30 insulin may be preferred at discharge instead of 4 shots/day of Lantus and Novolog. Can get Novolin 70/30 in a pen for $44 at [facility].  PCP - [facility]   Inpatient Diabetes Program Recommendations:   (for discharge)  Novolin 70/30 15 units bid Metformin 500 mg bid Will order Living Well with Diabetes book and Insulin pen starter kit. RN to begin teaching insulin pen administration Will need f/u with PCP within week of discharge for diabetes mgmt  Pt to schedule appt with CDE at [facility] for newly-diagnosed DM Will need prescription for glucose meter  (#     ) and insulin pen needles (#      )  Please use each patient interaction to provide diabetes education. Please review Living Well with Diabetes booklet with the patient, have patient watch patient education videos on diabetes, and instruct on insulin administration. Please allow patient to be actively engaged with diabetes management by allowing patient to check own glucose and self-administer insulin injections. Diabetes Coordinator will follow up with patient and reinforce diabetes education.  Will speak with pt in am and communicate with RN regarding diabetes education, insulin pen administration and glucose monitoring in am on 5/4.  Thank you. Lorenda Peck, RD, LDN,  CDE Inpatient Diabetes Coordinator [phone]

## 2019-04-01 ENCOUNTER — Inpatient Hospital Stay (HOSPITAL_COMMUNITY): Payer: Self-pay

## 2019-04-01 LAB — GLUCOSE, CAPILLARY
Glucose-Capillary: 203 mg/dL — ABNORMAL HIGH (ref 70–99)
Glucose-Capillary: 104 mg/dL — ABNORMAL HIGH (ref 70–99)
Glucose-Capillary: 220 mg/dL — ABNORMAL HIGH (ref 70–99)
Glucose-Capillary: 334 mg/dL — ABNORMAL HIGH (ref 70–99)
Glucose-Capillary: 243 mg/dL — ABNORMAL HIGH (ref 70–99)

## 2019-04-01 LAB — URINALYSIS, ROUTINE W REFLEX MICROSCOPIC
Bilirubin Urine: NEGATIVE
Glucose, UA: 50 mg/dL — AB
Hgb urine dipstick: NEGATIVE
Ketones, ur: NEGATIVE mg/dL
Leukocytes,Ua: NEGATIVE
Nitrite: NEGATIVE
Protein, ur: NEGATIVE mg/dL
Specific Gravity, Urine: 1.017 (ref 1.005–1.030)
pH: 9 — ABNORMAL HIGH (ref 5.0–8.0)

## 2019-04-01 LAB — CBC
HCT: 32.4 % — ABNORMAL LOW (ref 36.0–46.0)
Hemoglobin: 10.9 g/dL — ABNORMAL LOW (ref 12.0–15.0)
MCH: 27.5 pg (ref 26.0–34.0)
MCHC: 33.6 g/dL (ref 30.0–36.0)
MCV: 81.8 fL (ref 80.0–100.0)
Platelets: 342 10*3/uL (ref 150–400)
RBC: 3.96 MIL/uL (ref 3.87–5.11)
RDW: 13.5 % (ref 11.5–15.5)
WBC: 11.3 10*3/uL — ABNORMAL HIGH (ref 4.0–10.5)
nRBC: 0.3 % — ABNORMAL HIGH (ref 0.0–0.2)

## 2019-04-01 LAB — BASIC METABOLIC PANEL
Anion gap: 8 (ref 5–15)
BUN: 30 mg/dL — ABNORMAL HIGH (ref 8–23)
CO2: 33 mmol/L — ABNORMAL HIGH (ref 22–32)
Calcium: 9.1 mg/dL (ref 8.9–10.3)
Chloride: 99 mmol/L (ref 98–111)
Creatinine, Ser: 0.96 mg/dL (ref 0.44–1.00)
GFR calc Af Amer: >60 mL/min (ref >60)
GFR calc non Af Amer: >60 mL/min (ref >60)
Glucose, Bld: 96 mg/dL (ref 70–99)
Potassium: 3.9 mmol/L (ref 3.5–5.1)
Sodium: 140 mmol/L (ref 135–145)

## 2019-04-01 LAB — T4, FREE: Free T4: 1.71 ng/dL (ref 0.82–1.77)

## 2019-04-01 MED ORDER — PREDNISONE 10 MG PO TABS: 30.0000 mg | ORAL_TABLET | Freq: Every day | ORAL | Status: DC

## 2019-04-01 MED ORDER — HYDROCORTISONE ACETATE 25 MG RE SUPP
25.0000 mg | Freq: Two times a day (BID) | RECTAL | Status: DC
  Administered 2019-04-01 – 2019-04-03 (×4): 25 mg via RECTAL
  Filled 2019-04-01 (×6): qty 1

## 2019-04-01 NOTE — Progress Notes (Signed)
Spoke with patient's daughter to update on condition. I will call around 6 to update again

## 2019-04-01 NOTE — Progress Notes (Signed)
   03/31/19 2348  Vitals  BP 108/67  BP Location Left Arm  BP Method Automatic  Patient Position (if appropriate) Sitting  Oxygen Therapy  O2 Device Room Air  MEWS Score  MEWS RR 0  MEWS Pulse 0  MEWS Systolic 0  MEWS LOC 0  MEWS Temp 0  MEWS Score 0  MEWS Score Color Green  Provider Notification  Provider Name/Title Purohoit MD  Date Provider Notified 03/31/19  Time Provider Notified 2349  Notification Type Page  Notification Reason Change in status  Response No new orders  Date of Provider Response 04/01/19   Called to patient's room.  She had gotten up to [facility].Marland Kitchen  She had formed stool, brown in color.  She had large amount of bright red urine.  He HR had jumped to 150s sustained, AFIB.  No c/o dizziness or SOB.  Back to bed.  HR now in 80s, AFIB.  She also c/o of pink tinged sputum.  B/P 108/67.  Dr. Twana First made aware.  Call MD if patient becomes acutely ill.  Otherwise, continue to monitor during the night and will look at AM labs.  Discussed with patient and advised for her not to get up without calling for help.  Will continue to monitor patient.  Earleen Reaper RN-BC, [employer]

## 2019-04-01 NOTE — Progress Notes (Signed)
Pt  Physical Therapy Treatment Patient Details Name: Kayla Roberts MRN: [id number] DOB: July 13, 1956 Today's Date: 04/01/2019    History of Present Illness 63 y.o. female with past medical history of hypertension, hyperlipidemia, morbid obesity, GERD, depression, anxiety, asthma. admitted to [facility] with pna d/t covid 19; pt went into abib with RVR 4/30, on oral cardizem    PT Comments    The patient is on RA with saturation >94%, HR up to 137,during  Mobility, Only ambulated near reclioner as remained on monitor. Continue PT.   Follow Up Recommendations  Home health PT     Equipment Recommendations  Rolling walker with 5" wheels;3in1 (PT)(bariatric)    Recommendations for Other Services       Precautions / Restrictions Precautions Precautions: Other (comment) Precaution Comments: monitor VS--pt went into afib with RVR 4/30; on long acting oral cardizem; remains tachy     Mobility  Bed Mobility               General bed mobility comments: OOB  Transfers Overall transfer level: Needs assistance   Transfers: Sit to/from Stand Sit to Stand: Min guard         General transfer comment: min/guard to min for safe transition to stand, incr time needed  Ambulation/Gait Ambulation/Gait assistance: Min guard Gait Distance (Feet): 10 Feet   Gait Pattern/deviations: Step-through pattern     General Gait Details: 10' forward and back x 3, HR jumps to 137.   Stairs             Wheelchair Mobility    Modified Rankin (Stroke Patients Only)       Balance                                            Cognition Arousal/Alertness: Awake/alert                                            Exercises General Exercises - Lower Extremity Long Arc Quad: AROM;Seated;Both;10 reps Hip Flexion/Marching: AROM;Standing;5 reps;Both    General Comments        Pertinent Vitals/Pain Pain Assessment: No/denies pain    Home Living                       Prior Function            PT Goals (current goals can now be found in the care plan section) Progress towards PT goals: Progressing toward goals    Frequency    Min 4X/week      PT Plan Current plan remains appropriate;Discharge plan needs to be updated    Co-evaluation              AM-PAC PT "6 Clicks" Mobility   Outcome Measure  Help needed turning from your back to your side while in a flat bed without using bedrails?: A Little Help needed moving from lying on your back to sitting on the side of a flat bed without using bedrails?: A Little Help needed moving to and from a bed to a chair (including a wheelchair)?: A Little Help needed standing up from a chair using your arms (e.g., wheelchair or bedside chair)?: A Little Help needed to walk in hospital room?: A Lot Help  needed climbing 3-5 steps with a railing? : A Lot 6 Click Score: 16    End of Session   Activity Tolerance: Treatment limited secondary to medical complications (Comment)(HR incr.) Patient left: in chair;with call bell/phone within reach Nurse Communication: Mobility status(HR) PT Visit Diagnosis: Unsteadiness on feet (R26.81)     Time: 1282-0813 PT Time Calculation (min) (ACUTE ONLY): 26 min  Charges:  $Gait Training: 23-37 mins                     [place] Pager [phone] Office [phone]    Claretha Cooper 04/01/2019, 5:04 PM

## 2019-04-01 NOTE — Progress Notes (Signed)
PROGRESS NOTE        PATIENT DETAILS Name: Kayla Roberts Age: 63 y.o. Sex: female Date of Birth: [birth year] Admit Date: 03/23/2019 Admitting Physician Terrilee Croak, MD DXA:JOINOMVE, Hoyle Sauer, MD  Brief Narrative: Patient is a 63 y.o. female with PMHx of HTN, dyslipidemia, GERD, depression, anxiety who presented with fever, cough and shortness of breath-was found to have acute hypoxic respiratory failure in the setting of COVID 19 viral pneumonia.  Hospital course complicated by development of A. fib with RVR.  Subjective: Claims to have one episode of hematochezia this morning-has a known history of hemorrhoids.  Apparently still tachycardic with ambulation.  Assessment/Plan: Acute hypoxic respiratory failure secondary to COVID-19 viral pneumonia: Slowly improving-less symptoms-on Zithromax-continue to titrate down dosing of prednisone.  Continue supportive care-encourage ambulation.    A. fib with RVR: Remains in atrial fibrillation-heart rate still not optimal-increased to the 150s range earlier this morning with minimal ambulation.  Will get first dose of 60 mg of long-acting Cardizem today--we will follow closely-if remains tachycardic on Cardizem-we will add beta-blocker.  Continue Eliquis-had one episode of hematochezia today-we will watch closely-if hematochezia reoccurs-Eliquis will be discontinued.  We will plan on outpatient follow-up with cardiology for consideration of DC cardioversion if still in atrial fibrillation.   Hematochezia: 1 episode on 5/4-she has a known history of hemorrhoids-on Eliquis for atrial fibrillation-we will watch closely for now-if hematochezia reoccurs-stop Eliquis.  H&H remains stable.  Newly diagnosed DM-2: CBGs relatively stable-suspect requirements for insulin may decrease as steroid dosing is being tapered down.  For now cautiously continue with Lantus and SSI.  A1c 7.0.    Hypertension: Blood pressure appears reasonable  with Cardizem.  Morbid obesity   DVT Prophylaxis: Full dose anticoagulation with Eliquis  Code Status: Full code   Family Communication: None at bedside  Disposition Plan: Remain inpatient-hopefully home soon if clinical improvement continues  Antimicrobial agents: Anti-infectives (From admission, onward)   Start     Dose/Rate Route Frequency Ordered Stop   03/27/19 1100  azithromycin (ZITHROMAX) tablet 500 mg     500 mg Oral Daily 03/27/19 1046 03/31/19 0958      Procedures: None  CONSULTS:  None  Time spent: 25- minutes-Greater than 50% of this time was spent in counseling, explanation of diagnosis, planning of further management, and coordination of care.  MEDICATIONS: Scheduled Meds:  apixaban  5 mg Oral BID   atorvastatin  20 mg Oral q1800   beclomethasone  2 puff Inhalation BID   diltiazem  360 mg Oral Daily   guaiFENesin  1,200 mg Oral BID   hydrocortisone  25 mg Rectal BID   insulin aspart  0-20 Units Subcutaneous TID WC   insulin aspart  0-5 Units Subcutaneous QHS   insulin glargine  15 Units Subcutaneous Daily   insulin starter kit- pen needles  1 kit Other Once   Ipratropium-Albuterol  2 puff Inhalation BID   pantoprazole  40 mg Oral BID   predniSONE  40 mg Oral Q breakfast   senna  1 tablet Oral BID   vitamin C  500 mg Oral Daily   zinc sulfate  220 mg Oral Daily   Continuous Infusions: PRN Meds:.acetaminophen **OR** acetaminophen, chlorpheniramine-HYDROcodone, guaiFENesin-codeine, levalbuterol, magnesium hydroxide, ondansetron **OR** ondansetron (ZOFRAN) IV, sodium chloride, sodium phosphate, sorbitol   PHYSICAL EXAM: Vital signs: Vitals:   04/01/19 0400 04/01/19 0500  04/01/19 0600 04/01/19 0813  BP:    112/72  Pulse: 81 96 98 (!) 105  Resp: _0 (!) 21  Temp:    98.8 F (37.1 C)  TempSrc:    Oral  SpO2: 96% 95% 91% 94%  Weight:  (!) 142.7 kg    Height:       Filed Weights   03/30/19 0500 03/31/19 0703 04/01/19  0500  Weight: (!) 142.8 kg (!) 141 kg (!) 142.7 kg   Body mass index is 47.83 kg/m.   General appearance:Awake, alert, not in any distress.  Eyes:no scleral icterus. HEENT: Atraumatic and Normocephalic Neck: supple, no JVD. Resp:Good air entry bilaterally,no rales or rhonchi CVS: S1 S2 regular, no murmurs.  GI: Bowel sounds present, Non tender and not distended with no gaurding, rigidity or rebound. Extremities: B/L Lower Ext shows no edema, both legs are warm to touch Neurology:  Non focal Psychiatric: Normal judgment and insight. Normal mood. Musculoskeletal:No digital cyanosis Skin:No Rash, warm and dry Wounds:N/A  I have personally reviewed following labs and imaging studies  LABORATORY DATA: CBC: Recent Labs  Lab 03/27/19 0500 03/28/19 0535 03/28/19 2342 03/30/19 0500 04/01/19 0818  WBC 10.2 8.8 8.9 13.6* 11.3*  HGB 10.6* 10.3* 11.3* 11.5* 10.9*  HCT 31.1* 31.9* 34.0* 35.3* 32.4*  MCV 81.2 82.4 81.3 82.7 81.8  PLT 421* 416* 437* 480* 253    Basic Metabolic Panel: Recent Labs  Lab 03/28/19 0535 03/28/19 2342 03/30/19 0500 03/31/19 0430 04/01/19 0818  NA 139 137 141 137 140  K 4.2 3.6 4.1 4.3 3.9  CL 99 96* 98 96* 99  CO2 31 32 32 32 33*  GLUCOSE 181* 320* 187* 213* 96  BUN 48* 37* 33* 38* 30*  CREATININE 1.03* 1.04* 0.94 1.05* 0.96  CALCIUM 9.3 9.2 9.5 9.1 9.1  MG 2.4  --   --  2.2  --     GFR: Estimated Creatinine Clearance: 89.2 mL/min (by C-G formula based on SCr of 0.96 mg/dL).  Liver Function Tests: Recent Labs  Lab 03/26/19 0455 03/27/19 0500 03/28/19 2342  AST _1 ALT _2 ALKPHOS 101 97 107  BILITOT 0.5 0.4 0.5  PROT 7.4 7.4 7.4  ALBUMIN 3.2* 3.3* 3.3*   No results for input(s): LIPASE, AMYLASE in the last 168 hours. No results for input(s): AMMONIA in the last 168 hours.  Coagulation Profile: No results for input(s): INR, PROTIME in the last 168 hours.  Cardiac Enzymes: Recent Labs  Lab 03/28/19 2342  03/29/19 1200  TROPONINI <0.03 <0.03    BNP (last 3 results) No results for input(s): PROBNP in the last 8760 hours.  HbA1C: No results for input(s): HGBA1C in the last 72 hours.  CBG: Recent Labs  Lab 03/31/19 0807 03/31/19 1144 03/31/19 1707 03/31/19 2032 04/01/19 0813  GLUCAP 221* 375* 203* 283* 104*    Lipid Profile: No results for input(s): CHOL, HDL, LDLCALC, TRIG, CHOLHDL, LDLDIRECT in the last 72 hours.  Thyroid Function Tests: Recent Labs    03/30/19 0500  FREET4 1.71  T3FREE 2.8    Anemia Panel: Recent Labs    03/30/19 0500  FERRITIN 192    Urine analysis: No results found for: COLORURINE, APPEARANCEUR, LABSPEC, PHURINE, GLUCOSEU, HGBUR, BILIRUBINUR, KETONESUR, PROTEINUR, UROBILINOGEN, NITRITE, LEUKOCYTESUR  Sepsis Labs: Lactic Acid, Venous No results found for: LATICACIDVEN  MICROBIOLOGY: Recent Results (from the past 240 hour(s))  SARS Coronavirus 2 [facility] order, Performed in [facility] hospital lab)  Status: Abnormal   Collection Time: 03/23/19  1:03 PM  Result Value Ref Range Status   SARS Coronavirus 2 POSITIVE (A) NEGATIVE Final    Comment: RESULT CALLED TO, READ BACK BY AND VERIFIED WITH: P DOWD,RN 03/23/19 1457 RHOLMES (NOTE) If result is NEGATIVE SARS-CoV-2 target nucleic acids are NOT DETECTED. The SARS-CoV-2 RNA is generally detectable in upper and lower  respiratory specimens during the acute phase of infection. The lowest  concentration of SARS-CoV-2 viral copies this assay can detect is 250  copies / mL. A negative result does not preclude SARS-CoV-2 infection  and should not be used as the sole basis for treatment or other  patient management decisions.  A negative result may occur with  improper specimen collection / handling, submission of specimen other  than nasopharyngeal swab, presence of viral mutation(s) within the  areas targeted by this assay, and inadequate number of viral copies  (<250 copies / mL). A  negative result must be combined with clinical  observations, patient history, and epidemiological information. If result is POSITIVE SARS-CoV-2 target nucleic acids are DETECTED. The SA RS-CoV-2 RNA is generally detectable in upper and lower  respiratory specimens during the acute phase of infection.  Positive  results are indicative of active infection with SARS-CoV-2.  Clinical  correlation with patient history and other diagnostic information is  necessary to determine patient infection status.  Positive results do  not rule out bacterial infection or co-infection with other viruses. If result is PRESUMPTIVE POSTIVE SARS-CoV-2 nucleic acids MAY BE PRESENT.   A presumptive positive result was obtained on the submitted specimen  and confirmed on repeat testing.  While 2019 novel coronavirus  (SARS-CoV-2) nucleic acids may be present in the submitted sample  additional confirmatory testing may be necessary for epidemiological  and / or clinical management purposes  to differentiate between  SARS-CoV-2 and other Sarbecovirus currently known to infect humans.  If clinically indicated additional testing with an alternate test  methodology [phone]) is advi sed. The SARS-CoV-2 RNA is generally  detectable in upper and lower respiratory specimens during the acute  phase of infection. The expected result is Negative. Fact Sheet for Patients:  StrictlyIdeas.no Fact Sheet for Healthcare Providers: BankingDealers.co.za This test is not yet approved or cleared by the [place] FDA and has been authorized for detection and/or diagnosis of SARS-CoV-2 by FDA under an Emergency Use Authorization (EUA).  This EUA will remain in effect (meaning this test can be used) for the duration of the COVID-19 declaration under Section 564(b)(1) of the Act, 21 U.S.C. section 360bbb-3(b)(1), unless the authorization is terminated or revoked sooner. Performed  at [facility], [place] [address], [place], [place] [address]     RADIOLOGY STUDIES/RESULTS: Dg Chest Port 1 View  Result Date: 03/28/2019 CLINICAL DATA:  Pneumonia. EXAM: PORTABLE CHEST 1 VIEW COMPARISON:  Radiograph of March 25, 2019. FINDINGS: Stable cardiomegaly. No pneumothorax or pleural effusion is noted. Stable bilateral lung opacities are noted, right greater than left, concerning for pneumonia. Bony thorax is unremarkable. IMPRESSION: Stable bilateral lung opacities are noted concerning, right greater than left. Electronically Signed   By: Marijo Conception M.D.   On: 03/28/2019 07:42   Dg Chest Port 1 View  Result Date: 03/25/2019 CLINICAL DATA:  Hypoxia. EXAM: PORTABLE CHEST 1 VIEW COMPARISON:  None. FINDINGS: The heart size is normal. Bilateral interstitial and airspace disease is present. Lung volumes are low. IMPRESSION: 1. Low lung volumes with multi lobar interstitial and airspace  disease concerning for infection/multi lobar pneumonia. Electronically Signed   By: San Morelle M.D.   On: 03/25/2019 10:10   Dg Chest Port 1 View  Result Date: 03/23/2019 CLINICAL DATA:  63 year old female with history of pneumonia. Worsening shortness of breath and productive cough. EXAM: PORTABLE CHEST 1 VIEW COMPARISON:  Chest x-ray 03/18/2019. FINDINGS: Patchy multifocal ill-defined airspace disease throughout the lungs bilaterally, significantly increased compared to the prior examination, compatible with progressive multilobar pneumonia. No pleural effusions. No evidence of pulmonary edema. Heart size is mildly enlarged. The patient is rotated to the right on today's exam, resulting in distortion of the mediastinal contours and reduced diagnostic sensitivity and specificity for mediastinal pathology. IMPRESSION: 1. Worsening bilateral multilobar pneumonia, concerning for potential viral infection. Electronically Signed   By: Vinnie Langton M.D.   On: 03/23/2019 14:08   Dg  Chest Portable 1 View  Result Date: 03/18/2019 CLINICAL DATA:  Cough and shortness of breath for the past 5 days. New onset fever today. EXAM: PORTABLE CHEST 1 VIEW COMPARISON:  Chest x-ray dated March 08, 2018. FINDINGS: Stable mild cardiomegaly. Normal mediastinal contours. Normal pulmonary vascularity. New small opacity in the left mid lung. Minimal bibasilar atelectasis. No focal consolidation, pleural effusion, or pneumothorax. Unchanged pleural thickening at the bilateral costophrenic angles. No acute osseous abnormality. IMPRESSION: 1. New small opacity in the left mid lung could reflect developing pneumonia given clinical history. Followup PA and lateral chest X-ray is recommended in 3-4 weeks following trial of antibiotic therapy to ensure resolution. Electronically Signed   By: Titus Dubin M.D.   On: 03/18/2019 19:18     LOS: 9 days   Oren Binet, MD  [facility]  If 7PM-7AM, please contact night-coverage  Please page via [email or web address]  Go to [email or web address] and use [place] universal password to access. If you do not have the password, please contact the hospital operator.  Locate the [facility] provider you are looking for under [facility] and page to a number that you can be directly reached. If you still have difficulty reaching the provider, please page the [facility] (Director on Call) for the Hospitalists listed on amion for assistance.  04/01/2019, 11:49 AM

## 2019-04-01 NOTE — Care Management (Signed)
Eliquis benefits check sent and pending.  Lubertha Leite RN, BSN, NCM-BC, ACM-RN [phone] 

## 2019-04-01 NOTE — Progress Notes (Signed)
Spoke with daughter Karrie Meres. I told her that not much has changed over the course of the day and that we would know a bit more after the ECHO tomorrow.

## 2019-04-01 NOTE — Progress Notes (Signed)
ECHO tech called me back to tell me that a Dr. Meda Coffee decided to not have this echocardiogram completed at this time. No further details were relayed to this nurse.

## 2019-04-01 NOTE — Progress Notes (Signed)
Called over to ECHO and vascular let me know that those techs were gone for the day. She stated she would try to get a hold of them to see why the ECHO reads as "exam begun". It may mean that it will be done tomorrow, but this patient's nurse should follow-up tomorrow (5/5) to make sure the exam is completed.

## 2019-04-01 NOTE — Progress Notes (Signed)
Completed a Video Chat with the patient using the RN's iphone via Facetime.  Pt told me she has no family members with diabetes.  Was taking Prednisone prior to admission.  This may have affected pt's current A1c level of 7%.  Pt's PCP is Dr. Philipp Ovens with the [facility].  Pt plans to see her PCP after discharge for follow up.  Spoke with pt about new diagnosis.  Discussed A1C results with her and explained what an A1C is, basic pathophysiology of DM Type 2, basic home care, importance of checking CBGs and maintaining good CBG control to prevent long-term and short-term complications.  Reviewed signs and symptoms of hypoglycemia and how to treat hypoglycemia at home.  Also reviewed blood sugar goals and A1c goals for home.    Educated patient on insulin pen use at home.  Reviewed all steps of insulin pen including attachment of needle, 2-unit air shot, dialing up dose, giving injection, rotation of injection sites, removing needle, disposal of sharps, storage of unused insulin, disposal of insulin etc.  Pt was able to see me demonstrate the pen device over Facetime.  Plan to attach step by step instructions to pt's AVS as well at her request.  Have asked RNs caring for patient to please allow patient to give all injections here in hospital as much as possible for practice.  MD to give patient Rxs for insulin pens and insulin pen needles.    RNs to provide ongoing basic DM education at bedside with this patient.  Have ordered educational booklet, insulin starter kit, and DM videos.  Have also placed RD consult for DM diet education for this patient.     --Will follow patient during hospitalization--  Wyn Quaker RN, MSN, CDE Diabetes Coordinator Inpatient Glycemic Control Team Team Pager: [phone] (8a-5p)

## 2019-04-02 DIAGNOSIS — I480 Paroxysmal atrial fibrillation: Secondary | ICD-10-CM

## 2019-04-02 LAB — CBC
HCT: 31.4 % — ABNORMAL LOW (ref 36.0–46.0)
Hemoglobin: 10.2 g/dL — ABNORMAL LOW (ref 12.0–15.0)
MCH: 26.9 pg (ref 26.0–34.0)
MCHC: 32.5 g/dL (ref 30.0–36.0)
MCV: 82.8 fL (ref 80.0–100.0)
Platelets: 357 10*3/uL (ref 150–400)
RBC: 3.79 MIL/uL — ABNORMAL LOW (ref 3.87–5.11)
RDW: 13.6 % (ref 11.5–15.5)
WBC: 10.8 10*3/uL — ABNORMAL HIGH (ref 4.0–10.5)
nRBC: 0.3 % — ABNORMAL HIGH (ref 0.0–0.2)

## 2019-04-02 LAB — BASIC METABOLIC PANEL
Anion gap: 8 (ref 5–15)
BUN: 22 mg/dL (ref 8–23)
CO2: 31 mmol/L (ref 22–32)
Calcium: 8.9 mg/dL (ref 8.9–10.3)
Chloride: 100 mmol/L (ref 98–111)
Creatinine, Ser: 0.79 mg/dL (ref 0.44–1.00)
GFR calc Af Amer: >60 mL/min (ref >60)
GFR calc non Af Amer: >60 mL/min (ref >60)
Glucose, Bld: 137 mg/dL — ABNORMAL HIGH (ref 70–99)
Potassium: 4 mmol/L (ref 3.5–5.1)
Sodium: 139 mmol/L (ref 135–145)

## 2019-04-02 LAB — GLUCOSE, CAPILLARY
Glucose-Capillary: 122 mg/dL — ABNORMAL HIGH (ref 70–99)
Glucose-Capillary: 165 mg/dL — ABNORMAL HIGH (ref 70–99)
Glucose-Capillary: 297 mg/dL — ABNORMAL HIGH (ref 70–99)

## 2019-04-02 MED ORDER — METOPROLOL TARTRATE 25 MG PO TABS
25.0000 mg | ORAL_TABLET | Freq: Three times a day (TID) | ORAL | Status: DC
  Administered 2019-04-02: 25 mg via ORAL
  Filled 2019-04-02: qty 1

## 2019-04-02 MED ORDER — METOPROLOL TARTRATE 25 MG PO TABS
25.0000 mg | ORAL_TABLET | Freq: Two times a day (BID) | ORAL | Status: DC
  Administered 2019-04-03: 25 mg via ORAL
  Filled 2019-04-02: qty 1

## 2019-04-02 MED ORDER — PREDNISONE 10 MG PO TABS
20.0000 mg | ORAL_TABLET | Freq: Every day | ORAL | Status: DC
  Administered 2019-04-02 – 2019-04-03 (×2): 20 mg via ORAL
  Filled 2019-04-02 (×2): qty 2

## 2019-04-02 MED ORDER — IPRATROPIUM-ALBUTEROL 20-100 MCG/ACT IN AERS
2.0000 | INHALATION_SPRAY | Freq: Four times a day (QID) | RESPIRATORY_TRACT | Status: DC | PRN
  Filled 2019-04-02: qty 4

## 2019-04-02 NOTE — Consult Note (Signed)
CARDIOLOGY CONSULT NOTE  Patient ID: Kayla Roberts MRN: [id number] DOB/AGE: 01-26-1956 63 y.o.  Admit date: 03/23/2019 Referring Physician: [facility] Primary Physician:  Velna Ochs, MD Reason for Consultation: Atrial fibrillation   I connected with the patient on 04/02/2019 by a video enabled telemedicine application and verified that I am speaking with the correct person using two identifiers.     I discussed the limitations of evaluation and management by telemedicine and the availability of in person appointments. The patient expressed understanding and agreed to proceed.   This visit type was conducted due to national recommendations for restrictions regarding the COVID-19 Pandemic (e.g. social distancing).  This format is felt to be most appropriate for this patient at this time.  All issues noted in this document were discussed and addressed.  No physical exam was performed (except for noted visual exam findings with Tele health visits).  The patient has consented to conduct a Tele health visit and understands insurance will be billed.    HPI:   63 y.o. Caucasian female  with hypertension, seasonal asthma, morbid obesity, currently admitted with COVID pneumonia. We were consulted for management of newly diagnosed atrial fibrillation.  Patient has been hospitalized since 03/23/2019 with acute on respiratory failure.  She was tested positive for COVID-19 on 03/23/2019.  She has improved from her pneumonia standpoint, now requiring no oxygen.  She is on tapering dose of prednisone, along with antibiotics.  It appears that patient developed atrial fibrillation sometime around 03/28/2019, and has been in RVR.  She was initially started on diltiazem 360 mg once daily.  She was also started on Eliquis 5 mg twice daily.  She has had some hematochezia, which is being monitored by the primary team.  She does have baseline mild anemia.  Today, after my initial consultation she was  started on metoprolol 25 mg, of which she has received 1 dose.  Her heart rate is now better controlled, without any signs of hypertension.    At baseline, she lives fairly sedentary lifestyle.  She previously worked at [employer] as a Charity fundraiser.  She denies any chest pain, shortness of breath, prior to this hospital admission.  Echocardiogram in 03/2018 showed structurally normal heart.  Past Medical History:  Diagnosis Date  . Anxiety   . Asthma   . Depression   . GERD (gastroesophageal reflux disease)   . Hyperlipidemia   . Hypertension   . Morbid obesity ([place])      Past Surgical History:  Procedure Laterality Date  . CERVICAL DISCECTOMY  2002  . COLONOSCOPY WITH PROPOFOL N/A 01/18/2019   Procedure: COLONOSCOPY WITH PROPOFOL;  Surgeon: Clarene Essex, MD;  Location: [facility]COPY;  Service: Endoscopy;  Laterality: N/A;  . POLYPECTOMY  01/18/2019   Procedure: POLYPECTOMY;  Surgeon: Clarene Essex, MD;  Location: [facility] ENDOSCOPY;  Service: Endoscopy;;     Family History  Problem Relation Age of Onset  . Hypertension Mother   . Hyperlipidemia Mother   . Heart disease Father   . Hyperlipidemia Father   . Hypertension Father   . Hypertension Brother   . Heart disease Brother   . Hypertension Daughter   . Diabetes Sister      Social History: Social History   Socioeconomic History  . Marital status: Widowed    Spouse name: Not on file  . Number of children: Not on file  . Years of education: Not on file  . Highest education level: Not on file  Occupational History  .  Not on file  Social Needs  . Financial resource strain: Not on file  . Food insecurity:    Worry: Not on file    Inability: Not on file  . Transportation needs:    Medical: Not on file    Non-medical: Not on file  Tobacco Use  . Smoking status: Never Smoker  . Smokeless tobacco: Never Used  Substance and Sexual Activity  . Alcohol use: No  . Drug use: No  . Sexual activity: Not on file   Lifestyle  . Physical activity:    Days per week: Not on file    Minutes per session: Not on file  . Stress: Not on file  Relationships  . Social connections:    Talks on phone: Not on file    Gets together: Not on file    Attends religious service: Not on file    Active member of club or organization: Not on file    Attends meetings of clubs or organizations: Not on file    Relationship status: Not on file  . Intimate partner violence:    Fear of current or ex partner: Not on file    Emotionally abused: Not on file    Physically abused: Not on file    Forced sexual activity: Not on file  Other Topics Concern  . Not on file  Social History Narrative  . Not on file     Medications Prior to Admission  Medication Sig Dispense Refill Last Dose  . albuterol (PROVENTIL HFA;VENTOLIN HFA) 108 (90 Base) MCG/ACT inhaler INHALE 2 PUFFS INTO THE LUNGS EVERY 6 HOURS AS NEEDED FOR WHEEZING OR SHORTNESS OF BREATH (Patient taking differently: Inhale 2 puffs into the lungs every 6 (six) hours as needed for wheezing or shortness of breath. ) 18 g 1 03/23/2019 at Unknown time  . amLODipine (NORVASC) 5 MG tablet TAKE 1 TABLET(5 MG) BY MOUTH DAILY (Patient taking differently: Take 5 mg by mouth daily. ) 90 tablet 1 03/23/2019 at Unknown time  . aspirin 81 MG tablet Take 81 mg by mouth daily.    03/22/2019 at Unknown time  . atorvastatin (LIPITOR) 20 MG tablet Take 1 tablet (20 mg total) by mouth daily. 30 tablet 11 03/23/2019 at Unknown time  . [EXPIRED] doxycycline (VIBRAMYCIN) 100 MG capsule Take 1 capsule (100 mg total) by mouth 2 (two) times daily for 7 days. 14 capsule 0 03/23/2019 at Unknown time  . Flaxseed, Linseed, (FLAX SEEDS PO) Take 1 tablet by mouth daily.   03/22/2019 at Unknown time  . guaiFENesin-codeine 100-10 MG/5ML syrup Take 5 mLs by mouth at bedtime as needed for cough. 118 mL 0 Past Week at Unknown time  . hydrochlorothiazide (HYDRODIURIL) 25 MG tablet Take 25 mg by mouth daily.    03/23/2019 at Unknown time  . lisinopril (PRINIVIL,ZESTRIL) 20 MG tablet Take 1 tablet (20 mg total) by mouth daily. 30 tablet 2 03/23/2019 at Unknown time  . predniSONE (STERAPRED UNI-PAK 21 TAB) 10 MG (21) TBPK tablet Take by mouth daily. Take 6 tabs by mouth daily  for 2 days, then 5 tabs for 2 days, then 4 tabs for 2 days, then 3 tabs for 2 days, 2 tabs for 2 days, then 1 tab by mouth daily for 2 days 42 tablet 0 03/23/2019 at Unknown time  . QVAR REDIHALER 40 MCG/ACT inhaler INHALE 2 PUFFS INTO THE LUNGS TWICE DAILY 10.6 g 5 Past Week at Unknown time    Review of Systems  Constitution:  Negative for decreased appetite, malaise/fatigue, weight gain and weight loss.  HENT: Negative for congestion.   Eyes: Negative for visual disturbance.  Cardiovascular: Negative for chest pain, dyspnea on exertion, leg swelling, palpitations and syncope.  Respiratory: Negative for shortness of breath (Much improved).   Endocrine: Negative for cold intolerance.  Hematologic/Lymphatic: Does not bruise/bleed easily.  Skin: Negative for itching and rash.  Musculoskeletal: Negative for myalgias.  Gastrointestinal: Negative for abdominal pain, nausea and vomiting.  Genitourinary: Negative for dysuria.  Neurological: Negative for dizziness and weakness.  Psychiatric/Behavioral: The patient is not nervous/anxious.   All other systems reviewed and are negative.     Physical Exam: Physical Exam  Constitutional: She is oriented to person, place, and time. She appears well-developed and well-nourished. No distress.  Pulmonary/Chest: Effort normal.  Musculoskeletal:        General: No edema.  Neurological: She is alert and oriented to person, place, and time.  Psychiatric: She has a normal mood and affect.  Nursing note and vitals reviewed.    Labs:   Lab Results  Component Value Date   WBC 10.8 (H) 04/02/2019   HGB 10.2 (L) 04/02/2019   HCT 31.4 (L) 04/02/2019   MCV 82.8 04/02/2019   PLT 357  04/02/2019    Recent Labs  Lab 03/28/19 2342  04/02/19 0450  NA 137   < > 139  K 3.6   < > 4.0  CL 96*   < > 100  CO2 32   < > 31  BUN 37*   < > 22  CREATININE 1.04*   < > 0.79  CALCIUM 9.2   < > 8.9  PROT 7.4  --   --   BILITOT 0.5  --   --   ALKPHOS 107  --   --   ALT 26  --   --   AST 23  --   --   GLUCOSE 320*   < > 137*   < > = values in this interval not displayed.    Lipid Panel     Component Value Date/Time   CHOL 175 02/02/2016 1538   TRIG 61 02/02/2016 1538   HDL 84 02/02/2016 1538   CHOLHDL 2.1 02/02/2016 1538   VLDL 12 02/02/2016 1538   LDLCALC 79 02/02/2016 1538    BNP (last 3 results) Recent Labs    03/23/19 1303  BNP 38.2    HEMOGLOBIN A1C Lab Results  Component Value Date   HGBA1C 7.0 (H) 03/27/2019   MPG 154.2 03/27/2019    Cardiac Panel (last 3 results) Recent Labs    03/23/19 1303 03/28/19 2342 03/29/19 1200  TROPONINI <0.03 <0.03 <0.03    Lab Results  Component Value Date   TROPONINI <0.03 03/29/2019     TSH Recent Labs    03/28/19 2342  TSH <0.010*      Radiology: No results found.  Scheduled Meds: . apixaban  5 mg Oral BID  . atorvastatin  20 mg Oral q1800  . beclomethasone  2 puff Inhalation BID  . diltiazem  360 mg Oral Daily  . guaiFENesin  1,200 mg Oral BID  . hydrocortisone  25 mg Rectal BID  . insulin aspart  0-20 Units Subcutaneous TID WC  . insulin glargine  15 Units Subcutaneous Daily  . insulin starter kit- pen needles  1 kit Other Once  . metoprolol tartrate  25 mg Oral TID  . pantoprazole  40 mg Oral BID  . predniSONE  20  mg Oral Q breakfast  . senna  1 tablet Oral BID  . vitamin C  500 mg Oral Daily  . zinc sulfate  220 mg Oral Daily   Continuous Infusions: PRN Meds:.acetaminophen **OR** acetaminophen, chlorpheniramine-HYDROcodone, guaiFENesin-codeine, Ipratropium-Albuterol, levalbuterol, magnesium hydroxide, ondansetron **OR** ondansetron (ZOFRAN) IV, sodium chloride, sodium phosphate,  sorbitol  CARDIAC STUDIES:  EKG 03/28/2019: Afib w/RVR  Telemetry personally reviewed by me through video conferencing. Afib with controlled ventricular rate  Echocardiogram 03/31/2019: - Left ventricle: Global longitudinal LV strain is normal at -21.9%   The cavity size was normal. There was mild concentric   hypertrophy. Systolic function was normal. The estimated ejection   fraction was in the range of 60% to 65%. Wall motion was normal;   there were no regional wall motion abnormalities. There was an   increased relative contribution of atrial contraction to   ventricular filling. Doppler parameters are consistent with   abnormal left ventricular relaxation (grade 1 diastolic   dysfunction). - Aortic valve: Trileaflet; mildly thickened, mildly calcified   leaflets. - Mitral valve: Calcified annulus. - Tricuspid valve: There was trivial regurgitation. - Pulmonic valve: There was trivial regurgitation. - Pulmonary arteries: PA peak pressure: 32 mm Hg (S).    Assessment & Recommendations:  63 y.o. Caucasian female  with hypertension, seasonal asthma, morbid obesity, currently admitted with COVID pneumonia. We were consulted for management of newly diagnosed atrial fibrillation.  Paroxysmal Afib: Rate now much better controlled. Recommend metoprolol 25 mg bid, along with diltiazem 360 mg. Either one or both of her baseline antihypertensive medications-lisinopril and HCTZ-could be discontinued depending on her blood pressure readings.  CHA2DS2VASc score 3, annual stroke risk 3%. Agree with Eliquis 5 mg bid. The benefits will need to be weighed with her bleeding risks, in the setting of hematochezia.  I suspect Afib is triggered by her risk factors of obesity and hypertension, and acute COVID pneumonia. If it persists for 4-6 weeks, could consider cardioversion.  Thank you for the consult. I will arrange outpatient follow up.   Nigel Mormon, MD 04/02/2019, 3:28  PM [facility] Pager: [phone] Office: [phone] If no answer Cell [phone]

## 2019-04-02 NOTE — Progress Notes (Signed)
PROGRESS NOTE        PATIENT DETAILS Name: Kayla Roberts Age: 63 y.o. Sex: female Date of Birth: [birth year] Admit Date: 03/23/2019 Admitting Physician Terrilee Croak, MD DJM:[id number], Hoyle Sauer, MD  Brief Narrative: Patient is a 63 y.o. female with PMHx of HTN, dyslipidemia, GERD, depression, anxiety who presented with fever, cough and shortness of breath-was found to have acute hypoxic respiratory failure in the setting of COVID 19 viral pneumonia.  Hospital course complicated by development of A. fib with RVR.  Subjective: No further episodes of hematochezia  Assessment/Plan: Acute hypoxic respiratory failure secondary to COVID-19 viral pneumonia: Slowly continues to improve-continue to taper down dosing of prednisone.  Has completed a course of Zithromax.  Continue supportive care.  A. fib with RVR: Heart rate in the 130s at rest this morning-continue Cardizem 360 mg daily, have added beta-blocker.  Spoke with Dr. Einar Gip over the phone-subsequently spoke with Dr. Vonda Antigua will do a telemedicine consultation.  Continue Eliquis.  Echocardiogram probably can be done in the outpatient setting-echo a year ago demonstrated preserved EF.  If remains in persistent A. fib over the next few weeks-can consider outpatient DC cardioversion.  Hematochezia: 1 episode on 5/4-no further episodes since then-she has a known history of hemorrhoids-on Eliquis for atrial fibrillation-we will watch closely for now-if hematochezia reoccurs-stop Eliquis.  H&H remains stable.  Newly diagnosed DM-2: CBGs stable this morning-suspect as steroids continue to be tapered down-CBGs will continue to improve.  Continue Lantus and SSI-A1c 7.0.  We will reassess tomorrow whether or not she needs insulin or can be discharged on oral hypoglycemic agents.  Hypertension: Blood pressure appears reasonable with Cardizem.  Morbid obesity   DVT Prophylaxis: Full dose anticoagulation with Eliquis   Code Status: Full code   Family Communication: None at bedside  Disposition Plan: Remain inpatient-hopefully home soon if clinical improvement continues  Antimicrobial agents: Anti-infectives (From admission, onward)   Start     Dose/Rate Route Frequency Ordered Stop   03/27/19 1100  azithromycin (ZITHROMAX) tablet 500 mg     500 mg Oral Daily 03/27/19 1046 03/31/19 0958      Procedures: None  CONSULTS: Cardiology  Time spent: 25- minutes-Greater than 50% of this time was spent in counseling, explanation of diagnosis, planning of further management, and coordination of care.  MEDICATIONS: Scheduled Meds: . apixaban  5 mg Oral BID  . atorvastatin  20 mg Oral q1800  . beclomethasone  2 puff Inhalation BID  . diltiazem  360 mg Oral Daily  . guaiFENesin  1,200 mg Oral BID  . hydrocortisone  25 mg Rectal BID  . insulin aspart  0-20 Units Subcutaneous TID WC  . insulin glargine  15 Units Subcutaneous Daily  . insulin starter kit- pen needles  1 kit Other Once  . metoprolol tartrate  25 mg Oral TID  . pantoprazole  40 mg Oral BID  . predniSONE  20 mg Oral Q breakfast  . senna  1 tablet Oral BID  . vitamin C  500 mg Oral Daily  . zinc sulfate  220 mg Oral Daily   Continuous Infusions: PRN Meds:.acetaminophen **OR** acetaminophen, chlorpheniramine-HYDROcodone, guaiFENesin-codeine, Ipratropium-Albuterol, levalbuterol, magnesium hydroxide, ondansetron **OR** ondansetron (ZOFRAN) IV, sodium chloride, sodium phosphate, sorbitol   PHYSICAL EXAM: Vital signs: Vitals:   04/02/19 0800 04/02/19 0821 04/02/19 0908 04/02/19 1205  BP:  (!) 110/54 (!) 126/57 [place] Kitchen)  130/105  Pulse: 82 (!) 104 (!) 130 (!) 112  Resp: 19 11 (!) 22   Temp:  98.9 F (37.2 C)    TempSrc:  Axillary    SpO2: 94% 97% 98%   Weight:      Height:       Filed Weights   03/31/19 0703 04/01/19 0500 04/02/19 0500  Weight: (!) 141 kg (!) 142.7 kg (!) 139.4 kg   Body mass index is 46.73 kg/m.   General  appearance:Awake, alert, not in any distress.  Eyes:no scleral icterus. HEENT: Atraumatic and Normocephalic Neck: supple, no JVD. Resp:Good air entry bilaterally,no rales or rhonchi CVS: S1 S2 irregular GI: Bowel sounds present, Non tender and not distended with no gaurding, rigidity or rebound. Extremities: B/L Lower Ext shows no edema, both legs are warm to touch Neurology:  Non focal Psychiatric: Normal judgment and insight. Normal mood. Musculoskeletal:No digital cyanosis Skin:No Rash, warm and dry Wounds:N/A  I have personally reviewed following labs and imaging studies  LABORATORY DATA: CBC: Recent Labs  Lab 03/28/19 0535 03/28/19 2342 03/30/19 0500 04/01/19 0818 04/02/19 0450  WBC 8.8 8.9 13.6* 11.3* 10.8*  HGB 10.3* 11.3* 11.5* 10.9* 10.2*  HCT 31.9* 34.0* 35.3* 32.4* 31.4*  MCV 82.4 81.3 82.7 81.8 82.8  PLT 416* 437* 480* 342 572    Basic Metabolic Panel: Recent Labs  Lab 03/28/19 0535 03/28/19 2342 03/30/19 0500 03/31/19 0430 04/01/19 0818 04/02/19 0450  NA 139 137 141 137 140 139  K 4.2 3.6 4.1 4.3 3.9 4.0  CL 99 96* 98 96* 99 100  CO2 31 32 32 32 33* 31  GLUCOSE 181* 320* 187* 213* 96 137*  BUN 48* 37* 33* 38* 30* 22  CREATININE 1.03* 1.04* 0.94 1.05* 0.96 0.79  CALCIUM 9.3 9.2 9.5 9.1 9.1 8.9  MG 2.4  --   --  2.2  --   --     GFR: Estimated Creatinine Clearance: 105.5 mL/min (by C-G formula based on SCr of 0.79 mg/dL).  Liver Function Tests: Recent Labs  Lab 03/27/19 0500 03/28/19 2342  AST 16 23  ALT 18 26  ALKPHOS 97 107  BILITOT 0.4 0.5  PROT 7.4 7.4  ALBUMIN 3.3* 3.3*   No results for input(s): LIPASE, AMYLASE in the last 168 hours. No results for input(s): AMMONIA in the last 168 hours.  Coagulation Profile: No results for input(s): INR, PROTIME in the last 168 hours.  Cardiac Enzymes: Recent Labs  Lab 03/28/19 2342 03/29/19 1200  TROPONINI <0.03 <0.03    BNP (last 3 results) No results for input(s): PROBNP in the  last 8760 hours.  HbA1C: No results for input(s): HGBA1C in the last 72 hours.  CBG: Recent Labs  Lab 04/01/19 1134 04/01/19 1625 04/01/19 2103 04/02/19 0748 04/02/19 1143  GLUCAP 220* 334* 243* 122* 165*    Lipid Profile: No results for input(s): CHOL, HDL, LDLCALC, TRIG, CHOLHDL, LDLDIRECT in the last 72 hours.  Thyroid Function Tests: No results for input(s): TSH, T4TOTAL, FREET4, T3FREE, THYROIDAB in the last 72 hours.  Anemia Panel: No results for input(s): VITAMINB12, FOLATE, FERRITIN, TIBC, IRON, RETICCTPCT in the last 72 hours.  Urine analysis:    Component Value Date/Time   COLORURINE YELLOW 04/01/2019 0734   APPEARANCEUR HAZY (A) 04/01/2019 0734   LABSPEC 1.017 04/01/2019 0734   PHURINE 9.0 (H) 04/01/2019 0734   GLUCOSEU 50 (A) 04/01/2019 0734   HGBUR NEGATIVE 04/01/2019 0734   BILIRUBINUR NEGATIVE 04/01/2019 0734   KETONESUR NEGATIVE  04/01/2019 [place] 04/01/2019 0734   NITRITE NEGATIVE 04/01/2019 0734   LEUKOCYTESUR NEGATIVE 04/01/2019 0734    Sepsis Labs: Lactic Acid, Venous No results found for: LATICACIDVEN  MICROBIOLOGY: No results found for this or any previous visit (from the past 240 hour(s)).  RADIOLOGY STUDIES/RESULTS: Dg Chest Port 1 View  Result Date: 03/28/2019 CLINICAL DATA:  Pneumonia. EXAM: PORTABLE CHEST 1 VIEW COMPARISON:  Radiograph of March 25, 2019. FINDINGS: Stable cardiomegaly. No pneumothorax or pleural effusion is noted. Stable bilateral lung opacities are noted, right greater than left, concerning for pneumonia. Bony thorax is unremarkable. IMPRESSION: Stable bilateral lung opacities are noted concerning, right greater than left. Electronically Signed   By: Marijo Conception M.D.   On: 03/28/2019 07:42   Dg Chest Port 1 View  Result Date: 03/25/2019 CLINICAL DATA:  Hypoxia. EXAM: PORTABLE CHEST 1 VIEW COMPARISON:  None. FINDINGS: The heart size is normal. Bilateral interstitial and airspace disease is present.  Lung volumes are low. IMPRESSION: 1. Low lung volumes with multi lobar interstitial and airspace disease concerning for infection/multi lobar pneumonia. Electronically Signed   By: San Morelle M.D.   On: 03/25/2019 10:10   Dg Chest Port 1 View  Result Date: 03/23/2019 CLINICAL DATA:  63 year old female with history of pneumonia. Worsening shortness of breath and productive cough. EXAM: PORTABLE CHEST 1 VIEW COMPARISON:  Chest x-ray 03/18/2019. FINDINGS: Patchy multifocal ill-defined airspace disease throughout the lungs bilaterally, significantly increased compared to the prior examination, compatible with progressive multilobar pneumonia. No pleural effusions. No evidence of pulmonary edema. Heart size is mildly enlarged. The patient is rotated to the right on today's exam, resulting in distortion of the mediastinal contours and reduced diagnostic sensitivity and specificity for mediastinal pathology. IMPRESSION: 1. Worsening bilateral multilobar pneumonia, concerning for potential viral infection. Electronically Signed   By: Vinnie Langton M.D.   On: 03/23/2019 14:08   Dg Chest Portable 1 View  Result Date: 03/18/2019 CLINICAL DATA:  Cough and shortness of breath for the past 5 days. New onset fever today. EXAM: PORTABLE CHEST 1 VIEW COMPARISON:  Chest x-ray dated March 08, 2018. FINDINGS: Stable mild cardiomegaly. Normal mediastinal contours. Normal pulmonary vascularity. New small opacity in the left mid lung. Minimal bibasilar atelectasis. No focal consolidation, pleural effusion, or pneumothorax. Unchanged pleural thickening at the bilateral costophrenic angles. No acute osseous abnormality. IMPRESSION: 1. New small opacity in the left mid lung could reflect developing pneumonia given clinical history. Followup PA and lateral chest X-ray is recommended in 3-4 weeks following trial of antibiotic therapy to ensure resolution. Electronically Signed   By: Titus Dubin M.D.   On: 03/18/2019  19:18     LOS: 10 days   Oren Binet, MD  [facility]  If 7PM-7AM, please contact night-coverage  Please page via [email or web address]  Go to [email or web address] and use [facility]'s universal password to access. If you do not have the password, please contact the hospital operator.  Locate the [facility] provider you are looking for under [facility] and page to a number that you can be directly reached. If you still have difficulty reaching the provider, please page the [facility] (Director on Call) for the Hospitalists listed on amion for assistance.  04/02/2019, 1:15 PM

## 2019-04-02 NOTE — Progress Notes (Addendum)
Inpatient Diabetes Program Recommendations  AACE/ADA: New Consensus Statement on Inpatient Glycemic Control (2015)  Target Ranges:  Prepandial:   less than 140 mg/dL      Peak postprandial:   less than 180 mg/dL (1-2 hours)      Critically ill patients:  140 - 180 mg/dL   Lab Results  Component Value Date   GLUCAP 243 (H) 04/01/2019   HGBA1C 7.0 (H) 03/27/2019   Results for Kayla Roberts, Kayla Roberts (MRN [id number]) as of 04/02/2019 08:41  Ref. Range 04/01/2019 08:13 04/01/2019 11:34 04/01/2019 16:25 04/01/2019 21:03  Glucose-Capillary Latest Ref Range: 70 - 99 mg/dL 104 (H) 220 (H)  No coverage given 334 (H)   Novolog 15 units correction  243 (H)    Review of Glycemic Control  Diabetes history: New-onset Outpatient Diabetes medications: None Current orders for Inpatient glycemic control: Lantus 15 units daily                                                                          Novolog resistant (0-20 units) correction tid   Patient's prednisone is being tapered. Yesterday's Prednisone dose was 40 mg/daily. Today just received 20 mg (new dose). Expect insulin needs to decrease as steroids are reduced.   Looking at increasing blood glucose yesterday throughout the day, noted she did not receive Novolog correction yesterday (should have received 7 units) at lunch most likely contributing to the 382m/dl at dinner. Fasting blood glucose was 137 this am via labs.   Spoke to bedside RN today LBella Kennedyand she has had patient adminiser self insulin injections. Has at bedside the "living well with diabetes book" and "insulin pen starter kit". Has has insulin pen education per diabetes coordinator yesterday via facetime.   MD will decide tomorrow if needs to go home on insulin.    MD -   Please order a glucometer ([phone] at discharge. IF patient to go home on insulin she prefers insulin PEN and will also need pen needles (([phone]   -- Will follow during hospitalization.--  SJonna ClarkRN,  MSN Diabetes Coordinator Inpatient Glycemic Control Team Team Pager: [phone](8am-5pm)

## 2019-04-02 NOTE — TOC Benefit Eligibility Note (Signed)
Transition of Care [facility]) Benefit Eligibility Note    Patient Details  Name: Kayla Roberts MRN: [id number] Date of Birth: [birth year]   Medication/Dose: ELIQUIS   2.5 MG BID   AND   ELIQUIS  5 MG BID(APIXABAN : NON-FORMULARY)  Covered?: Yes     Prescription Coverage Preferred Pharmacy: YES([facility]   AND  [facility])  Spoke with Person/Company/Phone Number:: [employer]([facility] RX #  [phone])  Co-Pay: $ 3.90   FOR EACH PRESCRIPTION  Prior Approval: No  Deductible: Met  Additional Notes: SECONDARY INS: MEDICAID  ACCESS, EFF-DATE- 09-28-2017(CO-PAY- $3.90 FOR EACH PRESCRIPTION)    Memory Argue Phone Number: 04/02/2019, 10:08 AM

## 2019-04-03 DIAGNOSIS — U071 COVID-19: Secondary | ICD-10-CM | POA: Diagnosis not present

## 2019-04-03 LAB — GLUCOSE, CAPILLARY
Glucose-Capillary: 275 mg/dL — ABNORMAL HIGH (ref 70–99)
Glucose-Capillary: 234 mg/dL — ABNORMAL HIGH (ref 70–99)
Glucose-Capillary: 126 mg/dL — ABNORMAL HIGH (ref 70–99)
Glucose-Capillary: 249 mg/dL — ABNORMAL HIGH (ref 70–99)

## 2019-04-03 MED ORDER — APIXABAN 5 MG PO TABS: 5.0000 mg | ORAL_TABLET | Freq: Two times a day (BID) | ORAL | 0 refills | Status: DC

## 2019-04-03 MED ORDER — METOPROLOL TARTRATE 25 MG PO TABS: 25.0000 mg | ORAL_TABLET | Freq: Two times a day (BID) | ORAL | 0 refills | Status: DC

## 2019-04-03 MED ORDER — DILTIAZEM HCL ER COATED BEADS 360 MG PO CP24: 360.0000 mg | ORAL_CAPSULE | Freq: Every day | ORAL | 0 refills | Status: DC

## 2019-04-03 MED ORDER — BLOOD GLUCOSE METER KIT: PACK | 0 refills | Status: DC

## 2019-04-03 MED ORDER — METFORMIN HCL 500 MG PO TABS: 500.0000 mg | ORAL_TABLET | Freq: Two times a day (BID) | ORAL | 0 refills | Status: DC

## 2019-04-03 MED ORDER — ASCORBIC ACID 500 MG PO TABS: 500.0000 mg | ORAL_TABLET | Freq: Every day | ORAL | 0 refills | Status: DC

## 2019-04-03 MED ORDER — ZINC SULFATE 220 (50 ZN) MG PO CAPS: 220.0000 mg | ORAL_CAPSULE | Freq: Every day | ORAL | 0 refills | Status: AC

## 2019-04-03 NOTE — TOC Transition Note (Addendum)
Transition of Care [facility]) - CM/SW Discharge Note   Patient Details  Name: Kayla Roberts MRN: [id number] Date of Birth: 05-20-1956  Transition of Care [facility]) CM/SW Contact:  Midge Minium RN, BSN, NCM-BC, ACM-RN [phone] Phone Number: 04/03/2019, 11:56 AM   Clinical Narrative:    Patient is medically stable to transition home. HH has been arranged with [facility], with the liaison Adela Lank RN, updated on the patient tentative DC home today; 3n1, RW and tub bench will be serviced by [place]. [facility] will provide the patients 3n1 and RW prior to the patient transitioning home. Huey Romans will deliver the tub bench to the patients residence, with the patient agreeable to pay any out-of-pocket costs if not covered by her insurance. No further needs from CM.    Final next level of care: [place] Barriers to Discharge: No Barriers Identified   Patient Goals and CMS Choice Patient states their goals for this hospitalization and ongoing recovery are:: "to get better, I'm getting better each day" CMS Medicare.gov Compare Post Acute Care list provided to:: Patient Choice offered to / list presented to : Patient  Discharge Plan and Services In-house Referral: Clinical Social Work Discharge Planning Services: CM Consult Post Acute Care Choice: Durable Medical Equipment          DME Arranged: Bedside commode, Oxygen, Walker rolling DME Agency: [place] Date DME Agency Contacted: 04/03/19 Time DME Agency Contacted: [id number] Representative spoke with at DME Agency: Learta Codding HH Arranged: RN, Disease Management, PT, OT, Nurse's Aide [place] Agency: [place] Date [place]: 04/03/19 Time [place]: [id number] Representative spoke with at [place]: Adela Lank RN

## 2019-04-03 NOTE — Discharge Instructions (Addendum)
Person Under Monitoring Name: Kayla Roberts  Location: [place] [address]   Infection Prevention Recommendations for Individuals Confirmed to have, or Being Evaluated for, 2019 Novel Coronavirus (COVID-19) Infection Who Receive Care at Home  Individuals who are confirmed to have, or are being evaluated for, COVID-19 should follow the prevention steps below until a healthcare provider or local or state health department says they can return to normal activities.  Stay home except to get medical care You should restrict activities outside your home, except for getting medical care. Do not go to work, school, or public areas, and do not use public transportation or taxis.  Call ahead before visiting your doctor Before your medical appointment, call the healthcare provider and tell them that you have, or are being evaluated for, COVID-19 infection. This will help the healthcare providers office take steps to keep other people from getting infected. Ask your healthcare provider to call the local or state health department.  Monitor your symptoms Seek prompt medical attention if your illness is worsening (e.g., difficulty breathing). Before going to your medical appointment, call the healthcare provider and tell them that you have, or are being evaluated for, COVID-19 infection. Ask your healthcare provider to call the local or state health department.  Wear a facemask You should wear a facemask that covers your nose and mouth when you are in the same room with other people and when you visit a healthcare provider. People who live with or visit you should also wear a facemask while they are in the same room with you.  Separate yourself from other people in your home As much as possible, you should stay in a different room from other people in your home. Also, you should use a separate bathroom, if available.  Avoid sharing household items You should not  share dishes, drinking glasses, cups, eating utensils, towels, bedding, or other items with other people in your home. After using these items, you should wash them thoroughly with soap and water.  Cover your coughs and sneezes Cover your mouth and nose with a tissue when you cough or sneeze, or you can cough or sneeze into your sleeve. Throw used tissues in a lined trash can, and immediately wash your hands with soap and water for at least 20 seconds or use an alcohol-based hand rub.  Wash your [facility] your hands often and thoroughly with soap and water for at least 20 seconds. You can use an alcohol-based hand sanitizer if soap and water are not available and if your hands are not visibly dirty. Avoid touching your eyes, nose, and mouth with unwashed hands.   Prevention Steps for Caregivers and Household Members of Individuals Confirmed to have, or Being Evaluated for, COVID-19 Infection Being Cared for in the Home  If you live with, or provide care at home for, a person confirmed to have, or being evaluated for, COVID-19 infection please follow these guidelines to prevent infection:  Follow healthcare providers instructions Make sure that you understand and can help the patient follow any healthcare provider instructions for all care.  Provide for the patients basic needs You should help the patient with basic needs in the home and provide support for getting groceries, prescriptions, and other personal needs.  Monitor the patients symptoms If they are getting sicker, call his or her medical provider and tell them that the patient has, or is being evaluated for, COVID-19 infection. This will help the healthcare providers office take  steps to keep other people from getting infected. Ask the healthcare provider to call the local or state health department.  Limit the number of people who have contact with the patient  If possible, have only one caregiver for the  patient.  Other household members should stay in another home or place of residence. If this is not possible, they should stay  in another room, or be separated from the patient as much as possible. Use a separate bathroom, if available.  Restrict visitors who do not have an essential need to be in the home.  Keep older adults, very young children, and other sick people away from the patient Keep older adults, very young children, and those who have compromised immune systems or chronic health conditions away from the patient. This includes people with chronic heart, lung, or kidney conditions, diabetes, and cancer.  Ensure good ventilation Make sure that shared spaces in the home have good air flow, such as from an air conditioner or an opened window, weather permitting.  Wash your hands often  Wash your hands often and thoroughly with soap and water for at least 20 seconds. You can use an alcohol based hand sanitizer if soap and water are not available and if your hands are not visibly dirty.  Avoid touching your eyes, nose, and mouth with unwashed hands.  Use disposable paper towels to dry your hands. If not available, use dedicated cloth towels and replace them when they become wet.  Wear a facemask and gloves  Wear a disposable facemask at all times in the room and gloves when you touch or have contact with the patients blood, body fluids, and/or secretions or excretions, such as sweat, saliva, sputum, nasal mucus, vomit, urine, or feces.  Ensure the mask fits over your nose and mouth tightly, and do not touch it during use.  Throw out disposable facemasks and gloves after using them. Do not reuse.  Wash your hands immediately after removing your facemask and gloves.  If your personal clothing becomes contaminated, carefully remove clothing and launder. Wash your hands after handling contaminated clothing.  Place all used disposable facemasks, gloves, and other waste in a lined  container before disposing them with other household waste.  Remove gloves and wash your hands immediately after handling these items.  Do not share dishes, glasses, or other household items with the patient  Avoid sharing household items. You should not share dishes, drinking glasses, cups, eating utensils, towels, bedding, or other items with a patient who is confirmed to have, or being evaluated for, COVID-19 infection.  After the person uses these items, you should wash them thoroughly with soap and water.  Wash laundry thoroughly  Immediately remove and wash clothes or bedding that have blood, body fluids, and/or secretions or excretions, such as sweat, saliva, sputum, nasal mucus, vomit, urine, or feces, on them.  Wear gloves when handling laundry from the patient.  Read and follow directions on labels of laundry or clothing items and detergent. In general, wash and dry with the warmest temperatures recommended on the label.  Clean all areas the individual has used often  Clean all touchable surfaces, such as counters, tabletops, doorknobs, bathroom fixtures, toilets, phones, keyboards, tablets, and bedside tables, every day. Also, clean any surfaces that may have blood, body fluids, and/or secretions or excretions on them.  Wear gloves when cleaning surfaces the patient has come in contact with.  Use a diluted bleach solution (e.g., dilute bleach with 1 part bleach  and 10 parts water) or a household disinfectant with a label that says EPA-registered for coronaviruses. To make a bleach solution at home, add 1 tablespoon of bleach to 1 quart (4 cups) of water. For a larger supply, add  cup of bleach to 1 gallon (16 cups) of water.  Read labels of cleaning products and follow recommendations provided on product labels. Labels contain instructions for safe and effective use of the cleaning product including precautions you should take when applying the product, such as wearing gloves or  eye protection and making sure you have good ventilation during use of the product.  Remove gloves and wash hands immediately after cleaning.  Monitor yourself for signs and symptoms of illness Caregivers and household members are considered close contacts, should monitor their health, and will be asked to limit movement outside of the home to the extent possible. Follow the monitoring steps for close contacts listed on the symptom monitoring form.   ? If you have additional questions, contact your local health department or call the epidemiologist on call at [phone] (available 24/7). ? This guidance is subject to change. For the most up-to-date guidance from [facility], please refer to their website: [email or web address].pl      Symptoms of Hypoglycemia: Silly, Sweaty, Shaky Check sugar if you have your meter.  If near or less than 80 mg/dl, treat with 1/2 cup juice or soda or take glucose tablets Check sugar 15 minutes after treatment.  If sugar still near or less than 80 mg/al and symptomatic, treat again and may need a snack with some protein (peanut butter with crackers, etc)   Insulin Pen Instructions: 1. Remove Insulin pen cap and clean pen 1st with alcohol rub and then clean skin 2nd with alcohol rub 2. Twist insulin pen needle onto pen (right tighty) 3. Remove outer cap and inner cap from needle 4. Dial pen to 2 units and perform prime- press pen to zero and make sure liquid (insulin) comes out of the needle 5. Dial pen to your dose and perform injection into your abdomen 6. Hold needle in skin for 10 seconds after injection 7. Remove needle from insulin pen and discard 8. Place cap back on insulin pen and store safely (at room temperature) 9. Store unused pens in refrigerator and can keep opened insulin pen at room temperature (discard used pen after 30 days)  Rotate you Insulin Injection Sites Every Day  Store your open  Insulin pen at room temperature--Store your unopened insulin pens in the refrigerator--Put your sharp needles in a sturdy plastic container for safety  Fingerstick glucose (sugar) goals for home: Before meals: 80-130 mg/dl 2-Hours after meals: less than 180 mg/dl Hemoglobin A1c goal: 7% or less    Information on my medicine - ELIQUIS (apixaban)  This medication education was reviewed with me or my healthcare representative as part of my discharge preparation.  The pharmacist that spoke with me during my hospital stay was:  Napoleon Form [facility]  Why was Eliquis prescribed for you? Eliquis was prescribed for you to reduce the risk of a blood clot forming that can cause a stroke if you have a medical condition called atrial fibrillation (a type of irregular heartbeat).  What do You need to know about Eliquis ? Take your Eliquis TWICE DAILY - one tablet in the morning and one tablet in the evening with or without food. If you have difficulty swallowing the tablet whole please discuss with your pharmacist how to take the medication safely.  Take Eliquis exactly as prescribed by your doctor and DO NOT stop taking Eliquis without talking to the doctor who prescribed the medication.  Stopping may increase your risk of developing a stroke.  Refill your prescription before you run out.  After discharge, you should have regular check-up appointments with your healthcare provider that is prescribing your Eliquis.  In the future your dose may need to be changed if your kidney function or weight changes by a significant amount or as you get older.  What do you do if you miss a dose? If you miss a dose, take it as soon as you remember on the same day and resume taking twice daily.  Do not take more than one dose of ELIQUIS at the same time to make up a missed dose.  Important Safety Information A possible side effect of Eliquis is bleeding. You should call your healthcare provider right away if  you experience any of the following: ? Bleeding from an injury or your nose that does not stop. ? Unusual colored urine (red or dark brown) or unusual colored stools (red or black). ? Unusual bruising for unknown reasons. ? A serious fall or if you hit your head (even if there is no bleeding).  Some medicines may interact with Eliquis and might increase your risk of bleeding or clotting while on Eliquis. To help avoid this, consult your healthcare provider or pharmacist prior to using any new prescription or non-prescription medications, including herbals, vitamins, non-steroidal anti-inflammatory drugs (NSAIDs) and supplements.  This website has more information on Eliquis (apixaban): http://www.eliquis.com/eliquis/home         Person Under Monitoring Name: Kayla Roberts  Location: [place] [address]   CORONAVIRUS DISEASE 2019 (COVID-19) Guidance for Persons Under Investigation You are being tested for the virus that causes coronavirus disease 2019 (COVID-19). Public health actions are necessary to ensure protection of your health and the health of others, and to prevent further spread of infection. COVID-19 is caused by a virus that can cause symptoms, such as fever, cough, and shortness of breath. The primary transmission from person to person is by coughing or sneezing. On December 27, 2018, the [place] announced a [employer] Emergency of International Concern and on December 28, 2018 the U.S. Department of Health and Human Services declared a public health emergency. If the virus that causesCOVID-19 spreads in the community, it could have severe public health consequences.  As a person under investigation for COVID-19, the [place] advises you to adhere to the following guidance until your test results are reported to you. If your test result is positive, you will  receive additional information from your provider and your local health department at that time.   Remain at home until you are cleared by your health provider or public health authorities.   Keep a log of visitors to your home using the form provided. Any visitors to your home must be aware of your isolation status.  If you plan to move to a new address or leave the county, notify the local health department in your county.  Call a doctor or seek care if you have an urgent medical need. Before seeking medical care, call ahead and get instructions from the provider before arriving at the medical office, clinic or hospital. Notify them that you are being tested for the virus that causes COVID-19 so arrangements can be made, as necessary,  to prevent transmission to others in the healthcare setting. Next, notify the local health department in your county.  If a medical emergency arises and you need to call 911, inform the first responders that you are being tested for the virus that causes COVID-19. Next, notify the local health department in your county.  Adhere to all guidance set forth by the [place] for [facility] of patients that is based on guidance from the Center for Disease Control and Prevention with suspected or confirmed COVID-19. It is provided with this guidance for Persons Under Investigation.  Your health and the health of our community are our top priorities. Public Health officials remain available to provide assistance and counseling to you about COVID-19 and compliance with this guidance.  Provider: ____________________________________________________________ Date: ______/_____/_________  By signing below, you acknowledge that you have read and agree to comply with this Guidance for Persons Under Investigation. ______________________________________________________________ Date: ______/_____/_________  WHO DO I CALL? You can find a list of local  health departments here: [email or web address].[email or web address]/ Health Department: ____________________________________________________________________ Contact Name: ________________________________________________________________________ Telephone: ___________________________________________________________________________  Marice Potter, [place], Communicable Disease Branch COVID-19 Guidance for Persons Under Investigation February 02, 2019

## 2019-04-03 NOTE — Discharge Summary (Signed)
PATIENT DETAILS Name: Kayla Roberts Age: 63 y.o. Sex: female Date of Birth: [birth year] MRN: [id number]. Admitting Physician: Terrilee Croak, MD YQM:VHQIONGE, Hoyle Sauer, MD  Admit Date: 03/23/2019 Discharge date: 04/03/2019  Recommendations for Outpatient Follow-up:  1. Follow up with PCP in 1-2 weeks 2. Please obtain BMP/CBC in one week 3. Please ensure follow-up with cardiology.  Admitted From:  Home  Disposition: Home with home health services   Home Health: Yes  Equipment/Devices: None  Discharge Condition: Stable  CODE STATUS: FULL CODE  Diet recommendation:  Heart Healthy / Carb Modified  Brief Summary: See H&P, Labs, Consult and Test reports for all details in brief, Patient is a 63 y.o. female with PMHx of HTN, dyslipidemia, GERD, depression, anxiety who presented with fever, cough and shortness of breath-was found to have acute hypoxic respiratory failure in the setting of COVID 19 viral pneumonia.  Hospital course complicated by development of A. fib with RVR.  Brief Hospital Course: Acute hypoxic respiratory failure secondary to COVID-19 viral pneumonia:  Much improved by the day of discharge-completed a course of tapering prednisone and Zithromax.  He was much better and is actually requesting discharge.  Down to room air.    A. fib with RVR:  Hospital course complicated by RVR-did have heart rate in the 130s till 5/5-heart rate much better after adding beta-blocker, Cardizem being continued.  Evaluated by cardiology who will arrange for outpatient follow-up.  Continue Eliquis on discharge.  Last echocardiogram 1 year back demonstrated preserved EF-patient will require probably a repeat echocardiogram but this can be done in the outpatient setting, if the patient remains in A. fib in the next few weeks-outpatient cardioversion can be considered by cardiology.  I  Hematochezia: 1 episode on 5/4-no further episodes since then-she has a known history of  hemorrhoids-on Eliquis for atrial fibrillation-we will watch closely for now-if hematochezia reoccurs-may need to stop Eliquis.  H&H remains stable.  Newly diagnosed DM-2:  CBGs were elevated during this hospital stay-but patient was on steroids-which has been now tapered off.  A1c is at 7.0.  We will try patient on metformin-and have patient follow-up with patient's primary care practitioner for further optimization.   Hypertension: Blood pressure appears reasonable with Cardizem metoprolol.  Morbid obesity   Procedures/Studies: None  Discharge Diagnoses:  Active Problems:   Pneumonia due to COVID-19 virus   Acute on chronic respiratory failure with hypoxia (HCC)   Hyperglycemia   Paroxysmal atrial fibrillation [facility])   Discharge Instructions:  Activity:  As tolerated with Full fall precautions use walker/cane & assistance as needed  Discharge Instructions    Call MD for:  difficulty breathing, headache or visual disturbances   Complete by:  As directed    Diet - low sodium heart healthy   Complete by:  As directed    Diet Carb Modified   Complete by:  As directed    Discharge instructions   Complete by:  As directed    Follow with Primary MD  Velna Ochs, MD in 1 week  Follow up with Cardioliogy-they will call you with a follow up appointment  Check your blood sugars 3-4 times a day, keep a record of these readings and take it to your primary care practitioners office.  Please get a complete blood count and chemistry panel checked by your Primary MD at your next visit, and again as instructed by your Primary MD.  Get Medicines reviewed and adjusted: Please take all your medications with you for your next visit  with your Primary MD  Laboratory/radiological data: Please request your Primary MD to go over all hospital tests and procedure/radiological results at the follow up, please ask your Primary MD to get all Hospital records sent to his/her office.  In some  cases, they will be blood work, cultures and biopsy results pending at the time of your discharge. Please request that your primary care M.D. follows up on these results.  Also Note the following: If you experience worsening of your admission symptoms, develop shortness of breath, life threatening emergency, suicidal or homicidal thoughts you must seek medical attention immediately by calling 911 or calling your MD immediately  if symptoms less severe.  You must read complete instructions/literature along with all the possible adverse reactions/side effects for all the Medicines you take and that have been prescribed to you. Take any new Medicines after you have completely understood and accpet all the possible adverse reactions/side effects.   Do not drive when taking Pain medications or sleeping medications (Benzodaizepines)  Do not take more than prescribed Pain, Sleep and Anxiety Medications. It is not advisable to combine anxiety,sleep and pain medications without talking with your primary care practitioner  Special Instructions: If you have smoked or chewed Tobacco  in the last 2 yrs please stop smoking, stop any regular Alcohol  and or any Recreational drug use.  Wear Seat belts while driving.  Please note: You were cared for by a hospitalist during your hospital stay. Once you are discharged, your primary care physician will handle any further medical issues. Please note that NO REFILLS for any discharge medications will be authorized once you are discharged, as it is imperative that you return to your primary care physician (or establish a relationship with a primary care physician if you do not have one) for your post hospital discharge needs so that they can reassess your need for medications and monitor your lab values.   ?   Person Under Monitoring Name: Wyllow Seigler  Location: [place] [address]   Infection Prevention Recommendations for Individuals  Confirmed to have, or Being Evaluated for, 2019 Novel Coronavirus (COVID-19) Infection Who Receive Care at Home  Individuals who are confirmed to have, or are being evaluated for, COVID-19 should follow the prevention steps below until a healthcare provider or local or state health department says they can return to normal activities.  Stay home except to get medical care You should restrict activities outside your home, except for getting medical care. Do not go to work, school, or public areas, and do not use public transportation or taxis.  Call ahead before visiting your doctor Before your medical appointment, call the healthcare provider and tell them that you have, or are being evaluated for, COVID-19 infection. This will help the healthcare providers office take steps to keep other people from getting infected. Ask your healthcare provider to call the local or state health department.  Monitor your symptoms Seek prompt medical attention if your illness is worsening (e.g., difficulty breathing). Before going to your medical appointment, call the healthcare provider and tell them that you have, or are being evaluated for, COVID-19 infection. Ask your healthcare provider to call the local or state health department.  Wear a facemask You should wear a facemask that covers your nose and mouth when you are in the same room with other people and when you visit a healthcare provider. People who live with or visit you should also wear a facemask while they are in  the same room with you.  Separate yourself from other people in your home As much as possible, you should stay in a different room from other people in your home. Also, you should use a separate bathroom, if available.  Avoid sharing household items You should not share dishes, drinking glasses, cups, eating utensils, towels, bedding, or other items with other people in your home. After using these items, you should wash them  thoroughly with soap and water.  Cover your coughs and sneezes Cover your mouth and nose with a tissue when you cough or sneeze, or you can cough or sneeze into your sleeve. Throw used tissues in a lined trash can, and immediately wash your hands with soap and water for at least 20 seconds or use an alcohol-based hand rub.  Wash your [facility] your hands often and thoroughly with soap and water for at least 20 seconds. You can use an alcohol-based hand sanitizer if soap and water are not available and if your hands are not visibly dirty. Avoid touching your eyes, nose, and mouth with unwashed hands.   Prevention Steps for Caregivers and Household Members of Individuals Confirmed to have, or Being Evaluated for, COVID-19 Infection Being Cared for in the Home  If you live with, or provide care at home for, a person confirmed to have, or being evaluated for, COVID-19 infection please follow these guidelines to prevent infection:  Follow healthcare providers instructions Make sure that you understand and can help the patient follow any healthcare provider instructions for all care.  Provide for the patients basic needs You should help the patient with basic needs in the home and provide support for getting groceries, prescriptions, and other personal needs.  Monitor the patients symptoms If they are getting sicker, call his or her medical provider and tell them that the patient has, or is being evaluated for, COVID-19 infection. This will help the healthcare providers office take steps to keep other people from getting infected. Ask the healthcare provider to call the local or state health department.  Limit the number of people who have contact with the patient If possible, have only one caregiver for the patient. Other household members should stay in another home or place of residence. If this is not possible, they should stay in another room, or be separated from the patient as  much as possible. Use a separate bathroom, if available. Restrict visitors who do not have an essential need to be in the home.  Keep older adults, very young children, and other sick people away from the patient Keep older adults, very young children, and those who have compromised immune systems or chronic health conditions away from the patient. This includes people with chronic heart, lung, or kidney conditions, diabetes, and cancer.  Ensure good ventilation Make sure that shared spaces in the home have good air flow, such as from an air conditioner or an opened window, weather permitting.  Wash your hands often Wash your hands often and thoroughly with soap and water for at least 20 seconds. You can use an alcohol based hand sanitizer if soap and water are not available and if your hands are not visibly dirty. Avoid touching your eyes, nose, and mouth with unwashed hands. Use disposable paper towels to dry your hands. If not available, use dedicated cloth towels and replace them when they become wet.  Wear a facemask and gloves Wear a disposable facemask at all times in the room and gloves when you touch  or have contact with the patients blood, body fluids, and/or secretions or excretions, such as sweat, saliva, sputum, nasal mucus, vomit, urine, or feces.  Ensure the mask fits over your nose and mouth tightly, and do not touch it during use. Throw out disposable facemasks and gloves after using them. Do not reuse. Wash your hands immediately after removing your facemask and gloves. If your personal clothing becomes contaminated, carefully remove clothing and launder. Wash your hands after handling contaminated clothing. Place all used disposable facemasks, gloves, and other waste in a lined container before disposing them with other household waste. Remove gloves and wash your hands immediately after handling these items.  Do not share dishes, glasses, or other household items with the  patient Avoid sharing household items. You should not share dishes, drinking glasses, cups, eating utensils, towels, bedding, or other items with a patient who is confirmed to have, or being evaluated for, COVID-19 infection. After the person uses these items, you should wash them thoroughly with soap and water.  Wash laundry thoroughly Immediately remove and wash clothes or bedding that have blood, body fluids, and/or secretions or excretions, such as sweat, saliva, sputum, nasal mucus, vomit, urine, or feces, on them. Wear gloves when handling laundry from the patient. Read and follow directions on labels of laundry or clothing items and detergent. In general, wash and dry with the warmest temperatures recommended on the label.  Clean all areas the individual has used often Clean all touchable surfaces, such as counters, tabletops, doorknobs, bathroom fixtures, toilets, phones, keyboards, tablets, and bedside tables, every day. Also, clean any surfaces that may have blood, body fluids, and/or secretions or excretions on them. Wear gloves when cleaning surfaces the patient has come in contact with. Use a diluted bleach solution (e.g., dilute bleach with 1 part bleach and 10 parts water) or a household disinfectant with a label that says EPA-registered for coronaviruses. To make a bleach solution at home, add 1 tablespoon of bleach to 1 quart (4 cups) of water. For a larger supply, add  cup of bleach to 1 gallon (16 cups) of water. Read labels of cleaning products and follow recommendations provided on product labels. Labels contain instructions for safe and effective use of the cleaning product including precautions you should take when applying the product, such as wearing gloves or eye protection and making sure you have good ventilation during use of the product. Remove gloves and wash hands immediately after cleaning.  Monitor yourself for signs and symptoms of illness Caregivers and household  members are considered close contacts, should monitor their health, and will be asked to limit movement outside of the home to the extent possible. Follow the monitoring steps for close contacts listed on the symptom monitoring form.   ? If you have additional questions, contact your local health department or call the epidemiologist on call at [phone] (available 24/7). ? This guidance is subject to change. For the most up-to-date guidance from [facility], please refer to their website: YouBlogs.pl   Increase activity slowly   Complete by:  As directed    MyChart COVID-19 home monitoring program   Complete by:  Apr 03, 2019    Is the patient willing to use the [facility] [facility] for home monitoring?:  Yes   Temperature monitoring   Complete by:  Apr 03, 2019    After how many days would you like to receive a notification of this patient's flowsheet entries?:  1     Allergies as  of 04/03/2019   No Known Allergies     Medication List    STOP taking these medications   amLODipine 5 MG tablet Commonly known as:  NORVASC   aspirin 81 MG tablet   doxycycline 100 MG capsule Commonly known as:  VIBRAMYCIN   hydrochlorothiazide 25 MG tablet Commonly known as:  HYDRODIURIL   lisinopril 20 MG tablet Commonly known as:  ZESTRIL   predniSONE 10 MG (21) Tbpk tablet Commonly known as:  STERAPRED UNI-PAK 21 TAB     TAKE these medications   albuterol 108 (90 Base) MCG/ACT inhaler Commonly known as:  VENTOLIN HFA INHALE 2 PUFFS INTO THE LUNGS EVERY 6 HOURS AS NEEDED FOR WHEEZING OR SHORTNESS OF BREATH What changed:  See the new instructions.   apixaban 5 MG Tabs tablet Commonly known as:  ELIQUIS Take 1 tablet (5 mg total) by mouth 2 (two) times daily.   ascorbic acid 500 MG tablet Commonly known as:  VITAMIN C Take 1 tablet (500 mg total) by mouth daily. Start taking on:  Apr 04, 2019   atorvastatin 20 MG  tablet Commonly known as:  Lipitor Take 1 tablet (20 mg total) by mouth daily.   blood glucose meter kit and supplies Dispense based on patient and insurance preference. Use up to four times daily as directed. (FOR ICD-10 E10.9, E11.9).   diltiazem 360 MG 24 hr capsule Commonly known as:  CARDIZEM CD Take 1 capsule (360 mg total) by mouth daily. Start taking on:  Apr 04, 2019   FLAX SEEDS PO Take 1 tablet by mouth daily.   guaiFENesin-codeine 100-10 MG/5ML syrup Take 5 mLs by mouth at bedtime as needed for cough.   metFORMIN 500 MG tablet Commonly known as:  Glucophage Take 1 tablet (500 mg total) by mouth 2 (two) times daily with a meal.   metoprolol tartrate 25 MG tablet Commonly known as:  LOPRESSOR Take 1 tablet (25 mg total) by mouth 2 (two) times daily.   Qvar RediHaler 40 MCG/ACT inhaler Generic drug:  beclomethasone INHALE 2 PUFFS INTO THE LUNGS TWICE DAILY   zinc sulfate 220 (50 Zn) MG capsule Take 1 capsule (220 mg total) by mouth daily. Start taking on:  Apr 04, 2019            Durable Medical Equipment  (From admission, onward)         Start     Ordered   03/30/19 1033  For home use only DME Walker rolling  Once    Question:  Patient needs a walker to treat with the following condition  Answer:  Generalized weakness   03/30/19 1032   03/30/19 1030  For home use only DME 3 n 1  Once     03/30/19 [place] Follow up.   Why:  Bedside commode and rolling walker Contact information: [place] [address] [phone]        Care, [facility] Follow up.   Specialty:  [place] Why:  A representative from [facility] will contact you to arrange start date and time for your therapy. Contact information: [place] [address] [phone]        Velna Ochs, MD. Schedule an appointment as soon as possible for a visit  in 1 week(s).   Specialty:  Internal Medicine Contact information: [place]  [place], Manish J, MD Follow up.   Specialties:  Cardiology, Radiology Why:  Office will call you for a follow-up appointment, if you do not hear from them in the next 1-2 weeks, please give them a call. Contact information: [place] [address] [phone]          No Known Allergies  Consultations:   cardiology   Other Procedures/Studies: Dg Chest Port 1 View  Result Date: 03/28/2019 CLINICAL DATA:  Pneumonia. EXAM: PORTABLE CHEST 1 VIEW COMPARISON:  Radiograph of March 25, 2019. FINDINGS: Stable cardiomegaly. No pneumothorax or pleural effusion is noted. Stable bilateral lung opacities are noted, right greater than left, concerning for pneumonia. Bony thorax is unremarkable. IMPRESSION: Stable bilateral lung opacities are noted concerning, right greater than left. Electronically Signed   By: Marijo Conception M.D.   On: 03/28/2019 07:42   Dg Chest Port 1 View  Result Date: 03/25/2019 CLINICAL DATA:  Hypoxia. EXAM: PORTABLE CHEST 1 VIEW COMPARISON:  None. FINDINGS: The heart size is normal. Bilateral interstitial and airspace disease is present. Lung volumes are low. IMPRESSION: 1. Low lung volumes with multi lobar interstitial and airspace disease concerning for infection/multi lobar pneumonia. Electronically Signed   By: San Morelle M.D.   On: 03/25/2019 10:10   Dg Chest Port 1 View  Result Date: 03/23/2019 CLINICAL DATA:  63 year old female with history of pneumonia. Worsening shortness of breath and productive cough. EXAM: PORTABLE CHEST 1 VIEW COMPARISON:  Chest x-ray 03/18/2019. FINDINGS: Patchy multifocal ill-defined airspace disease throughout the lungs bilaterally, significantly increased compared to the prior examination, compatible with progressive multilobar pneumonia. No pleural effusions. No  evidence of pulmonary edema. Heart size is mildly enlarged. The patient is rotated to the right on today's exam, resulting in distortion of the mediastinal contours and reduced diagnostic sensitivity and specificity for mediastinal pathology. IMPRESSION: 1. Worsening bilateral multilobar pneumonia, concerning for potential viral infection. Electronically Signed   By: Vinnie Langton M.D.   On: 03/23/2019 14:08   Dg Chest Portable 1 View  Result Date: 03/18/2019 CLINICAL DATA:  Cough and shortness of breath for the past 5 days. New onset fever today. EXAM: PORTABLE CHEST 1 VIEW COMPARISON:  Chest x-ray dated March 08, 2018. FINDINGS: Stable mild cardiomegaly. Normal mediastinal contours. Normal pulmonary vascularity. New small opacity in the left mid lung. Minimal bibasilar atelectasis. No focal consolidation, pleural effusion, or pneumothorax. Unchanged pleural thickening at the bilateral costophrenic angles. No acute osseous abnormality. IMPRESSION: 1. New small opacity in the left mid lung could reflect developing pneumonia given clinical history. Followup PA and lateral chest X-ray is recommended in 3-4 weeks following trial of antibiotic therapy to ensure resolution. Electronically Signed   By: Titus Dubin M.D.   On: 03/18/2019 19:18      TODAY-DAY OF DISCHARGE:  Subjective:   [name] today has no headache,no chest abdominal pain,no new weakness tingling or numbness, feels much better wants to go home today.   Objective:   Blood pressure 94/64, pulse (!) 123, temperature 98.1 F (36.7 C), temperature source Oral, resp. rate (!) 22, height '5\' 8"'  (1.727 m), weight (!) 143.1 kg, SpO2 93 %.  Intake/Output Summary (Last 24 hours) at 04/03/2019 0954 Last data filed at 04/03/2019 0900 Gross per 24 hour  Intake 880 ml  Output 450 ml  Net 430 ml   Filed Weights   04/01/19 0500 04/02/19 0500 04/03/19 0421  Weight: Marland Kitchen)  142.7 kg (!) 139.4 kg (!) 143.1 kg    Exam: Awake Alert,  Oriented *3, No new F.N deficits, Normal affect .AT,PERRAL Supple Neck,No JVD, No cervical lymphadenopathy appriciated.  Symmetrical Chest wall movement, Good air movement bilaterally, CTAB RRR,No Gallops,Rubs or new Murmurs, No Parasternal Heave +ve B.Sounds, Abd Soft, Non tender, No organomegaly appriciated, No rebound -guarding or rigidity. No Cyanosis, Clubbing or edema, No new Rash or bruise   PERTINENT RADIOLOGIC STUDIES: Dg Chest Port 1 View  Result Date: 03/28/2019 CLINICAL DATA:  Pneumonia. EXAM: PORTABLE CHEST 1 VIEW COMPARISON:  Radiograph of March 25, 2019. FINDINGS: Stable cardiomegaly. No pneumothorax or pleural effusion is noted. Stable bilateral lung opacities are noted, right greater than left, concerning for pneumonia. Bony thorax is unremarkable. IMPRESSION: Stable bilateral lung opacities are noted concerning, right greater than left. Electronically Signed   By: Marijo Conception M.D.   On: 03/28/2019 07:42   Dg Chest Port 1 View  Result Date: 03/25/2019 CLINICAL DATA:  Hypoxia. EXAM: PORTABLE CHEST 1 VIEW COMPARISON:  None. FINDINGS: The heart size is normal. Bilateral interstitial and airspace disease is present. Lung volumes are low. IMPRESSION: 1. Low lung volumes with multi lobar interstitial and airspace disease concerning for infection/multi lobar pneumonia. Electronically Signed   By: San Morelle M.D.   On: 03/25/2019 10:10   Dg Chest Port 1 View  Result Date: 03/23/2019 CLINICAL DATA:  63 year old female with history of pneumonia. Worsening shortness of breath and productive cough. EXAM: PORTABLE CHEST 1 VIEW COMPARISON:  Chest x-ray 03/18/2019. FINDINGS: Patchy multifocal ill-defined airspace disease throughout the lungs bilaterally, significantly increased compared to the prior examination, compatible with progressive multilobar pneumonia. No pleural effusions. No evidence of pulmonary edema. Heart size is mildly enlarged. The patient is rotated to the  right on today's exam, resulting in distortion of the mediastinal contours and reduced diagnostic sensitivity and specificity for mediastinal pathology. IMPRESSION: 1. Worsening bilateral multilobar pneumonia, concerning for potential viral infection. Electronically Signed   By: Vinnie Langton M.D.   On: 03/23/2019 14:08   Dg Chest Portable 1 View  Result Date: 03/18/2019 CLINICAL DATA:  Cough and shortness of breath for the past 5 days. New onset fever today. EXAM: PORTABLE CHEST 1 VIEW COMPARISON:  Chest x-ray dated March 08, 2018. FINDINGS: Stable mild cardiomegaly. Normal mediastinal contours. Normal pulmonary vascularity. New small opacity in the left mid lung. Minimal bibasilar atelectasis. No focal consolidation, pleural effusion, or pneumothorax. Unchanged pleural thickening at the bilateral costophrenic angles. No acute osseous abnormality. IMPRESSION: 1. New small opacity in the left mid lung could reflect developing pneumonia given clinical history. Followup PA and lateral chest X-ray is recommended in 3-4 weeks following trial of antibiotic therapy to ensure resolution. Electronically Signed   By: Titus Dubin M.D.   On: 03/18/2019 19:18     PERTINENT LAB RESULTS: CBC: Recent Labs    04/01/19 0818 04/02/19 0450  WBC 11.3* 10.8*  HGB 10.9* 10.2*  HCT 32.4* 31.4*  PLT 342 357   CMET CMP     Component Value Date/Time   NA 139 04/02/2019 0450   K 4.0 04/02/2019 0450   CL 100 04/02/2019 0450   CO2 31 04/02/2019 0450   GLUCOSE 137 (H) 04/02/2019 0450   BUN 22 04/02/2019 0450   CREATININE 0.79 04/02/2019 0450   CREATININE 0.70 02/02/2016 1538   CALCIUM 8.9 04/02/2019 0450   PROT 7.4 03/28/2019 2342   ALBUMIN 3.3 (L) 03/28/2019 2342   AST 23 03/28/2019  6160   ALT 26 03/28/2019 2342   ALKPHOS 107 03/28/2019 2342   BILITOT 0.5 03/28/2019 2342   GFRNONAA >60 04/02/2019 0450   GFRAA >60 04/02/2019 0450    GFR Estimated Creatinine Clearance: 107.2 mL/min (by C-G formula  based on SCr of 0.79 mg/dL). No results for input(s): LIPASE, AMYLASE in the last 72 hours. No results for input(s): CKTOTAL, CKMB, CKMBINDEX, TROPONINI in the last 72 hours. Invalid input(s): POCBNP No results for input(s): DDIMER in the last 72 hours. No results for input(s): HGBA1C in the last 72 hours. No results for input(s): CHOL, HDL, LDLCALC, TRIG, CHOLHDL, LDLDIRECT in the last 72 hours. No results for input(s): TSH, T4TOTAL, T3FREE, THYROIDAB in the last 72 hours.  Invalid input(s): FREET3 No results for input(s): VITAMINB12, FOLATE, FERRITIN, TIBC, IRON, RETICCTPCT in the last 72 hours. Coags: No results for input(s): INR in the last 72 hours.  Invalid input(s): PT Microbiology: No results found for this or any previous visit (from the past 240 hour(s)).  FURTHER DISCHARGE INSTRUCTIONS:  Get Medicines reviewed and adjusted: Please take all your medications with you for your next visit with your Primary MD  Laboratory/radiological data: Please request your Primary MD to go over all hospital tests and procedure/radiological results at the follow up, please ask your Primary MD to get all Hospital records sent to his/her office.  In some cases, they will be blood work, cultures and biopsy results pending at the time of your discharge. Please request that your primary care M.D. goes through all the records of your hospital data and follows up on these results.  Also Note the following: If you experience worsening of your admission symptoms, develop shortness of breath, life threatening emergency, suicidal or homicidal thoughts you must seek medical attention immediately by calling 911 or calling your MD immediately  if symptoms less severe.  You must read complete instructions/literature along with all the possible adverse reactions/side effects for all the Medicines you take and that have been prescribed to you. Take any new Medicines after you have completely understood and  accpet all the possible adverse reactions/side effects.   Do not drive when taking Pain medications or sleeping medications (Benzodaizepines)  Do not take more than prescribed Pain, Sleep and Anxiety Medications. It is not advisable to combine anxiety,sleep and pain medications without talking with your primary care practitioner  Special Instructions: If you have smoked or chewed Tobacco  in the last 2 yrs please stop smoking, stop any regular Alcohol  and or any Recreational drug use.  Wear Seat belts while driving.  Please note: You were cared for by a hospitalist during your hospital stay. Once you are discharged, your primary care physician will handle any further medical issues. Please note that NO REFILLS for any discharge medications will be authorized once you are discharged, as it is imperative that you return to your primary care physician (or establish a relationship with a primary care physician if you do not have one) for your post hospital discharge needs so that they can reassess your need for medications and monitor your lab values.  Total Time spent coordinating discharge including counseling, education and face to face time equals 345 minutes.  SignedOren Binet 04/03/2019 9:54 AM

## 2019-04-03 NOTE — Progress Notes (Signed)
Physical Therapy Treatment Patient Details Name: Kayla Roberts MRN: [id number] DOB: [birth year] Today's Date: 04/03/2019    History of Present Illness 63 y.o. female with past medical history of hypertension, hyperlipidemia, morbid obesity, GERD, depression, anxiety, asthma. admitted to [facility] with pna d/t covid 19; pt went into abib with RVR 4/30, on oral cardizem    PT Comments    The patient is progressing well. Ambulated 100' then 140'. Plans DC today.   Follow Up Recommendations  Home health PT     Equipment Recommendations  Rolling walker with 5" wheels;3in1 (PT)    Recommendations for Other Services       Precautions / Restrictions Precautions Precautions: Other (comment) Precaution Comments: Watch HR and for Afib Restrictions Weight Bearing Restrictions: No    Mobility  Bed Mobility               General bed mobility comments: In recliner upon arrival  Transfers Overall transfer level: Needs assistance Equipment used: None Transfers: Sit to/from Stand Sit to Stand: Min guard Stand pivot transfers: Min assist;Min guard       General transfer comment: min/guard to min for safe transition to stand, incr time needed  Ambulation/Gait Ambulation/Gait assistance: Min guard Gait Distance (Feet): 100 Feet(then 140') Assistive device: Rolling walker (2 wheeled) Gait Pattern/deviations: Step-through pattern     General Gait Details: patient's HR max 117, sats    Stairs             Wheelchair Mobility    Modified Rankin (Stroke Patients Only)       Balance Overall balance assessment: Needs assistance   Sitting balance-Leahy Scale: Good       Standing balance-Leahy Scale: Fair Standing balance comment: reliant on UEs for dynamic balance                            Cognition Arousal/Alertness: Awake/alert Behavior During Therapy: WFL for tasks assessed/performed Overall Cognitive Status: Within Functional Limits for  tasks assessed                                 General Comments: High motivation      Exercises      General Comments General comments (skin integrity, edema, etc.): HR 70-90 rest. elevated to 117 with mobility; SpO2 >94% on RA; RR 30s; BP 103/63      Pertinent Vitals/Pain Pain Assessment: No/denies pain    Home Living Family/patient expects to be discharged to:: Private residence Living Arrangements: Children Available Help at Discharge: Family Type of Home: House Home Access: Level entry   Home Layout: One level Home Equipment: None      Prior Function Level of Independence: Independent      Comments: lives with dtrs, one dtr works at [employer] at [employer]   PT Goals (current goals can now be found in the care plan section) Acute Rehab PT Goals Patient Stated Goal: home soon Progress towards PT goals: Progressing toward goals    Frequency    Min 5X/week      PT Plan Current plan remains appropriate;Frequency needs to be updated    Co-evaluation PT/OT/SLP Co-Evaluation/Treatment: Yes Reason for Co-Treatment: For patient/therapist safety PT goals addressed during session: Mobility/safety with mobility OT goals addressed during session: ADL's and self-care      AM-PAC PT "6 Clicks" Mobility   Outcome Measure  Help needed turning from  your back to your side while in a flat bed without using bedrails?: A Little Help needed moving from lying on your back to sitting on the side of a flat bed without using bedrails?: A Little Help needed moving to and from a bed to a chair (including a wheelchair)?: A Little Help needed standing up from a chair using your arms (e.g., wheelchair or bedside chair)?: A Little Help needed to walk in hospital room?: A Little Help needed climbing 3-5 steps with a railing? : A Lot 6 Click Score: 17    End of Session   Activity Tolerance: Patient tolerated treatment well Patient left: in chair Nurse Communication: Mobility  status PT Visit Diagnosis: Unsteadiness on feet (R26.81)     Time: 4360-6770 PT Time Calculation (min) (ACUTE ONLY): 21 min  Charges:  $Gait Training: 8-22 mins                     [place] Pager [phone] Office [phone]    Claretha Cooper 04/03/2019, 2:13 PM

## 2019-04-03 NOTE — Evaluation (Signed)
Occupational Therapy Evaluation Patient Details Name: Kayla Roberts MRN: [id number] DOB: January 17, 1956 Today's Date: 04/03/2019    History of Present Illness 63 y.o. female with past medical history of hypertension, hyperlipidemia, morbid obesity, GERD, depression, anxiety, asthma. admitted to [facility] with pna d/t covid 19; pt went into abib with RVR 4/30, on oral cardizem   Clinical Impression   PTA, pt was living with her daughter and grandchildren and was independent with ADLs and IADLs. Currently, pt requires Min Guard A for ADLs and functional mobility using RW. Discussed DME options and recommendation; pt agreeable to tub bench for energy conservation and safety with bathing. Provided education on toilet transfer and tub transfer with 3N1 and tub bench; pt demonstrated and verbalized understanding. Answered all pt questions in preparation for dc later today. Recommend dc to home with HHOT for further OT to optimize safety, independence with ADLs, and return to PLOF.   HR 70-90 rest. elevated to 117 with mobility; SpO2 >94% on RA; RR 30s; BP 103/63    Follow Up Recommendations  Home health OT;Supervision/Assistance - 24 hour    Equipment Recommendations  3 in 1 bedside commode;Tub/shower bench    Recommendations for Other Services PT consult     Precautions / Restrictions Precautions Precautions: Other (comment) Precaution Comments: Watch HR and for Afib Restrictions Weight Bearing Restrictions: No      Mobility Bed Mobility               General bed mobility comments: In recliner upon arrival  Transfers Overall transfer level: Needs assistance Equipment used: None Transfers: Sit to/from Stand Sit to Stand: Min guard         General transfer comment: min/guard to min for safe transition to stand, incr time needed    Balance Overall balance assessment: Needs assistance   Sitting balance-Leahy Scale: Good       Standing balance-Leahy Scale: Fair Standing  balance comment: reliant on UEs for dynamic balance                           ADL either performed or assessed with clinical judgement   ADL Overall ADL's : Needs assistance/impaired Eating/Feeding: Set up;Supervision/ safety;Sitting   Grooming: Min guard;Oral care;Standing Grooming Details (indicate cue type and reason): Min Guard A for safety and pt with increased fatigue requiring to sit as soon as she finished Upper Body Bathing: Min guard;Sitting   Lower Body Bathing: Min guard;Sit to/from stand   Upper Body Dressing : Min guard;Sitting   Lower Body Dressing: Min guard;Sit to/from stand Lower Body Dressing Details (indicate cue type and reason): Pt able to don/doff socks Toilet Transfer: Min Psychiatric nurse Details (indicate cue type and reason): Min Guard A for safety. Educating on use of BSC for home       Tub/Shower Transfer Details (indicate cue type and reason): Educating pt on use of tub bench for safe transfer at home. Discussed different DME options and pt agreeable to tub bench Functional mobility during ADLs: Min guard General ADL Comments: Pt presenting with decreased activity tolerance, endurance, and balance.      Vision         Perception     Praxis      Pertinent Vitals/Pain Pain Assessment: No/denies pain     Hand Dominance Right   Extremity/Trunk Assessment Upper Extremity Assessment Upper Extremity Assessment: Overall WFL for tasks assessed   Lower Extremity Assessment Lower Extremity Assessment: Overall WFL for  tasks assessed   Cervical / Trunk Assessment Cervical / Trunk Assessment: Other exceptions Cervical / Trunk Exceptions: Increased body habitus   Communication Communication Communication: No difficulties   Cognition Arousal/Alertness: Awake/alert Behavior During Therapy: WFL for tasks assessed/performed Overall Cognitive Status: Within Functional Limits for tasks assessed                                  General Comments: High motivation   General Comments  HR 70-90 rest. elevated to 117 with mobility; SpO2 >94% on RA; RR 30s; BP 103/63    Exercises     Shoulder Instructions      Home Living Family/patient expects to be discharged to:: Private residence Living Arrangements: Children Available Help at Discharge: Family Type of Home: House Home Access: Level entry     Home Layout: One level     Bathroom Shower/Tub: Teacher, early years/pre: Standard     Home Equipment: None          Prior Functioning/Environment Level of Independence: Independent        Comments: lives with dtrs, one dtr works at [employer] at [place] List: Decreased strength;Decreased range of motion;Decreased activity tolerance;Impaired balance (sitting and/or standing);Decreased safety awareness;Pain;Decreased knowledge of use of DME or AE;Decreased knowledge of precautions      OT Treatment/Interventions:      OT Goals(Current goals can be found in the care plan section) Acute Rehab OT Goals Patient Stated Goal: home soon OT Goal Formulation: All assessment and education complete, DC therapy  OT Frequency:     Barriers to D/C:            Co-evaluation PT/OT/SLP Co-Evaluation/Treatment: Yes Reason for Co-Treatment: For patient/therapist safety;To address functional/ADL transfers([place] tail end of session for functional mobility in hallway)   OT goals addressed during session: ADL's and self-care      AM-PAC OT "6 Clicks" Daily Activity     Outcome Measure Help from another person eating meals?: None Help from another person taking care of personal grooming?: A Little Help from another person toileting, which includes using toliet, bedpan, or urinal?: A Little Help from another person bathing (including washing, rinsing, drying)?: A Little Help from another person to put on and taking off regular upper body clothing?: A Little Help from another person to put  on and taking off regular lower body clothing?: A Little 6 Click Score: 19   End of Session Equipment Utilized During Treatment: Gait belt;Rolling walker Nurse Communication: Mobility status  Activity Tolerance: Patient tolerated treatment well Patient left: in chair;with call bell/phone within reach;with chair alarm set  OT Visit Diagnosis: Unsteadiness on feet (R26.81);Other abnormalities of gait and mobility (R26.89);Muscle weakness (generalized) (M62.81);Other symptoms and signs involving cognitive function                Time: 1138-1209 OT Time Calculation (min): 31 min Charges:  OT General Charges $OT Visit: 1 Visit OT Evaluation $OT Eval Moderate Complexity: Elmo, OTR/L Acute Rehab Pager: [phone] Office: [place] 04/03/2019, 1:31 PM

## 2019-04-04 ENCOUNTER — Telehealth: Payer: Self-pay | Admitting: Internal Medicine

## 2019-04-04 NOTE — Telephone Encounter (Signed)
Pt wanted to let us know she is home from the hospital. Pls contact [phone]

## 2019-04-04 NOTE — Telephone Encounter (Signed)
Thank you. Please have her scheduled for an [facility] telehealth hospital follow up.

## 2019-04-04 NOTE — Telephone Encounter (Signed)
Patient notified of Kayla Roberts appt sch for 04/08/2019

## 2019-04-05 ENCOUNTER — Other Ambulatory Visit: Payer: Self-pay | Admitting: Oncology

## 2019-04-05 ENCOUNTER — Telehealth: Payer: Self-pay | Admitting: *Deleted

## 2019-04-05 MED ORDER — HYDROCORTISONE ACETATE 25 MG RE SUPP: 25.0000 mg | Freq: Two times a day (BID) | RECTAL | 3 refills | Status: DC

## 2019-04-05 NOTE — Telephone Encounter (Signed)
Spoke with pharmacist-rx for anucort supp cost more than $100.  Per pharmacy, insurance will pay for the cream.. Rx changed to proctozone 2.5%.  Pt aware.Despina Hidden Cassady5/8/202012:01 PM

## 2019-04-05 NOTE — Telephone Encounter (Signed)
[facility] states they will see pt Monday and f/u on problem

## 2019-04-05 NOTE — Telephone Encounter (Signed)
Call from pt - c/o bleeding from hemorrhoids; stated started when she was in the hospital for covid and was given a suppository. And is worse this morning and she's feeling weak. She is on Eliquis. Please advise.

## 2019-04-05 NOTE — Telephone Encounter (Signed)
Need to ask her if bleeding minimal or major. May need to get her in for CBC if signicicant bleeding but this would be unusual in most cases. I can prescribe the suppositories and have her call us back Monday to let us know if they are working.

## 2019-04-05 NOTE — Telephone Encounter (Signed)
Pt called / informed supp changed to cream.

## 2019-04-05 NOTE — Telephone Encounter (Signed)
Noted. I sent Rx for steroid suppositories to her pharmacy

## 2019-04-05 NOTE — Telephone Encounter (Signed)
Called pt - no answer; and vm has not been set up,unable to leave a message.

## 2019-04-05 NOTE — Telephone Encounter (Signed)
Talked to pt -  When asked about her bleeding, stated when she went to the bathroom this morning, there was blood in the commode. And has not been back to the bathroom since this am. Informed supp has been prescribe but not covered by her ins - stated she will pay for it out of pocket. And to call us back on Monday to let us know how she's doing. And if bleeding increases or any other problems to call EMS since she's is in isolation d/t testing + for covid-19. Verbalized understanding.  Also Aquilla Hacker is f/u on cost and/or prior auth for hydrocortisone supp.

## 2019-04-06 ENCOUNTER — Telehealth: Payer: Self-pay

## 2019-04-06 NOTE — Telephone Encounter (Signed)
CM attempted to contact patient post transition with no answer, nor VM set up; unable to leave a message.  Midge Minium RN, BSN, NCM-BC, ACM-RN [phone]

## 2019-04-08 ENCOUNTER — Ambulatory Visit (INDEPENDENT_AMBULATORY_CARE_PROVIDER_SITE_OTHER): Payer: Medicare HMO | Admitting: Internal Medicine

## 2019-04-08 ENCOUNTER — Other Ambulatory Visit: Payer: Self-pay | Admitting: *Deleted

## 2019-04-08 ENCOUNTER — Other Ambulatory Visit: Payer: Self-pay

## 2019-04-08 ENCOUNTER — Telehealth: Payer: Self-pay | Admitting: Internal Medicine

## 2019-04-08 DIAGNOSIS — I480 Paroxysmal atrial fibrillation: Secondary | ICD-10-CM

## 2019-04-08 DIAGNOSIS — R739 Hyperglycemia, unspecified: Secondary | ICD-10-CM

## 2019-04-08 DIAGNOSIS — Z79899 Other long term (current) drug therapy: Secondary | ICD-10-CM

## 2019-04-08 DIAGNOSIS — I10 Essential (primary) hypertension: Secondary | ICD-10-CM

## 2019-04-08 DIAGNOSIS — Z8619 Personal history of other infectious and parasitic diseases: Secondary | ICD-10-CM | POA: Diagnosis not present

## 2019-04-08 DIAGNOSIS — E118 Type 2 diabetes mellitus with unspecified complications: Secondary | ICD-10-CM

## 2019-04-08 DIAGNOSIS — Z7901 Long term (current) use of anticoagulants: Secondary | ICD-10-CM

## 2019-04-08 DIAGNOSIS — K648 Other hemorrhoids: Secondary | ICD-10-CM

## 2019-04-08 DIAGNOSIS — Z8701 Personal history of pneumonia (recurrent): Secondary | ICD-10-CM | POA: Diagnosis not present

## 2019-04-08 DIAGNOSIS — Z7984 Long term (current) use of oral hypoglycemic drugs: Secondary | ICD-10-CM

## 2019-04-08 MED ORDER — METFORMIN HCL 500 MG PO TABS: 500.0000 mg | ORAL_TABLET | Freq: Two times a day (BID) | ORAL | 0 refills | Status: DC

## 2019-04-08 NOTE — Assessment & Plan Note (Signed)
Patient was noted to have atrial fibrillation with RVR while she was in the hospital, she was started on metoprolol 25 mg twice daily, Cardizem 360 mg daily, and Eliquis 5 mg twice daily.  She reports that she is doing well, states that this morning her heart rate went up a little bit to 92 but this is the highest that it has been.  She denies any shortness of breath, chest pain, or palpitations.  Patient has a follow-up with cardiology and they will call her later this week.  -Continue Cardizem 360 mg daily -Continue metoprolol 25 mg twice daily -Continue Eliquis 5 mg twice daily -Follow-up with cardiology, if still in A. fib they may consider cardioversion

## 2019-04-08 NOTE — Assessment & Plan Note (Addendum)
Patient was recently hospitalized and was noted to be in A. fib with RVR during that time, she switched from her prehospital antihypertensives and is now taking metoprolol 25 mg twice daily and Cardizem 360 mg daily.  She reports that her most recent blood pressure was around 140/80.  She denies any issues taking her medications and denies any chest pain, shortness of breath, nausea, vomiting, headaches, lightheadedness or dizziness.  -Continue metoprolol 25 mg twice daily -Continue Cardizem 360 mg daily -Check blood pressure on next in person follow-up -Check BMP

## 2019-04-08 NOTE — Assessment & Plan Note (Signed)
Patient was noted to have hyperglycemia while in the hospital, A1c was 7, she had recently been treated with steroids however that had been tapered off early on in the hospital course but her CBGs were still elevated.  Patient was started on metformin 500 mg twice daily on discharge however patient reports that she been taking it 3 times daily.  Her CBGs have been mildly elevated, they range from 1 20-1 50.  She denies any issues taking her medications.  It is unclear if this may be related to her recent hospitalization and stress, we will continue the metformin at its described dose of twice daily and will recheck an A1c in a few months to evaluate.  -Continue metformin 500 mg twice daily, advised patient to take it twice daily instead of 3 times daily -Check a BMP -Repeat A1c and 1 to 2 months

## 2019-04-08 NOTE — Telephone Encounter (Signed)
Pt missed call, please return call [phone]

## 2019-04-08 NOTE — Assessment & Plan Note (Addendum)
During patient's hospitalization she was started on Eliquis for A. fib with RVR, she noted some increased hematochezia during that time however that had improved. Hgb on discharge was 10.9. After discharge patient noted some increased bright red blood per rectum and was given a prescription for hydrocortisone suppositories, however there is some issues with insurance and she was sent in a hydrocortisone cream.  Patient reports that she has been doing better, bleeding has improved and she denies any lightheadedness, dizziness, or fatigue.    Plan: -Continue hydrocortisone cream as needed -Check CBC to monitor hemoglobin

## 2019-04-08 NOTE — Progress Notes (Signed)
  [facility] Telephone Encounter Continuity Care Appointment  HPI:   This telephone encounter was created for Ms. Michaelle Bottomley on 04/08/2019 for the following purpose/cc follow-up of COVID-19 viral pneumonia, A. fib, diabetes, and hemorrhoids.   Past Medical History:  Past Medical History:  Diagnosis Date  . Anxiety   . Asthma   . Depression   . GERD (gastroesophageal reflux disease)   . Hyperlipidemia   . Hypertension   . Morbid obesity ([place])       ROS:   Reported mild hematochezia, denied any shortness of breath, chest pain, nausea, vomiting, fever, chills, lightheadedness, dizziness, or other issues.   Assessment / Plan / Recommendations:   Please see A&P under problem oriented charting for assessment of the patient's acute and chronic medical conditions.   As always, pt is advised that if symptoms worsen or new symptoms arise, they should go to an urgent care facility or to to ER for further evaluation.   Consent and Medical Decision Making:   Patient discussed with Dr. Beryle Beams  This is a telephone encounter between [facility] and Asencion Noble on 04/08/2019 for follow-up of COVID-19 viral pneumonia, A. fib, diabetes, and hemorrhoids.. The visit was conducted with the patient located at home and Asencion Noble at [facility]. The patient's identity was confirmed using their DOB and current address. The patient has consented to being evaluated through a telephone encounter and understands the associated risks (an examination cannot be done and the patient may need to come in for an appointment) / benefits (allows the patient to remain at home, decreasing exposure to coronavirus). I personally spent 15 minutes on medical discussion.

## 2019-04-09 LAB — CBC
Hematocrit: 27.4 % — ABNORMAL LOW (ref 34.0–46.6)
Hemoglobin: 9.2 g/dL — ABNORMAL LOW (ref 11.1–15.9)
MCH: 28.2 pg (ref 26.6–33.0)
MCHC: 33.6 g/dL (ref 31.5–35.7)
MCV: 84 fL (ref 79–97)
Platelets: 286 10*3/uL (ref 150–450)
RBC: 3.26 x10E6/uL — ABNORMAL LOW (ref 3.77–5.28)
RDW: 15.1 % (ref 11.7–15.4)
WBC: 5.1 10*3/uL (ref 3.4–10.8)

## 2019-04-09 LAB — BMP8+ANION GAP
Anion Gap: 21 mmol/L — ABNORMAL HIGH (ref 10.0–18.0)
BUN/Creatinine Ratio: 17 (ref 12–28)
BUN: 13 mg/dL (ref 8–27)
CO2: 22 mmol/L (ref 20–29)
Calcium: 9 mg/dL (ref 8.7–10.3)
Chloride: 102 mmol/L (ref 96–106)
Creatinine, Ser: 0.78 mg/dL (ref 0.57–1.00)
GFR calc Af Amer: 93 mL/min/{1.73_m2} (ref >59)
GFR calc non Af Amer: 81 mL/min/{1.73_m2} (ref >59)
Glucose: 119 mg/dL — ABNORMAL HIGH (ref 65–99)
Potassium: 4.5 mmol/L (ref 3.5–5.2)
Sodium: 145 mmol/L — ABNORMAL HIGH (ref 134–144)

## 2019-04-09 NOTE — Progress Notes (Addendum)
Medicine attending: Medical history, presenting problems, physical complaints, and medications, reviewed with resident physician Dr Lonia Skinner on the day of the patient telephone consultation and I concur with her evaluation and management plan. Second follow up call since hospital admission for COVID-19 w early pneumonia; did not require ICU or intubation. Respiratory sxs resolved. New onset A fib in hospital. Started beta-blocker & Eliquis. Called w flare of hemorrhoids on Friday. Rx topical steroid cream. Reports significant improvement. No other new issues.

## 2019-04-10 ENCOUNTER — Telehealth: Payer: Self-pay | Admitting: Internal Medicine

## 2019-04-10 ENCOUNTER — Telehealth: Payer: Self-pay

## 2019-04-10 DIAGNOSIS — J9601 Acute respiratory failure with hypoxia: Secondary | ICD-10-CM | POA: Diagnosis not present

## 2019-04-10 DIAGNOSIS — J1281 Pneumonia due to SARS-associated coronavirus: Secondary | ICD-10-CM | POA: Diagnosis not present

## 2019-04-10 DIAGNOSIS — I480 Paroxysmal atrial fibrillation: Secondary | ICD-10-CM | POA: Diagnosis not present

## 2019-04-10 DIAGNOSIS — U071 COVID-19: Secondary | ICD-10-CM | POA: Diagnosis not present

## 2019-04-10 DIAGNOSIS — F329 Major depressive disorder, single episode, unspecified: Secondary | ICD-10-CM | POA: Diagnosis not present

## 2019-04-10 DIAGNOSIS — E119 Type 2 diabetes mellitus without complications: Secondary | ICD-10-CM | POA: Diagnosis not present

## 2019-04-10 DIAGNOSIS — E7800 Pure hypercholesterolemia, unspecified: Secondary | ICD-10-CM | POA: Diagnosis not present

## 2019-04-10 DIAGNOSIS — F419 Anxiety disorder, unspecified: Secondary | ICD-10-CM | POA: Diagnosis not present

## 2019-04-10 DIAGNOSIS — I10 Essential (primary) hypertension: Secondary | ICD-10-CM | POA: Diagnosis not present

## 2019-04-10 NOTE — Telephone Encounter (Signed)
Requesting to speak with a nurse about heart rate. Please call pt back.

## 2019-04-10 NOTE — Telephone Encounter (Signed)
Heather calling from [facility] requesting a phone call back about [place] orders [phone].

## 2019-04-10 NOTE — Telephone Encounter (Signed)
Pt just seen in office yesterday. Pulse was regular. She is on Cardizem, metoprolol, Eliquis. Recent dx of A Fib. Let her know that a heart rate of 68 is well within normal range and she should not be concerned. I believe she also has an upcoming visit with Cardiology.

## 2019-04-10 NOTE — Telephone Encounter (Signed)
Return pt's call - stated she's concern about heart rate; stated this morning it's 68 but it has been 58 . Denies dizziness and lightheadedness. State she called her pharmacist who told her to call her doctor.

## 2019-04-10 NOTE — Telephone Encounter (Signed)
[facility] start of care today 1x week for 1 week 2x week for 1 week 1x week for 2 week Also PT and OT eval and treat VO given, do you agree?

## 2019-04-10 NOTE — Telephone Encounter (Signed)
Called / informed pt her HR of 68 is within nl range and should not be concern per Dr Beryle Beams. Stated ok and she was seen by wound nurse today and her HR was 62 and was reassured this is ok.

## 2019-04-11 ENCOUNTER — Other Ambulatory Visit: Payer: Self-pay | Admitting: Internal Medicine

## 2019-04-11 DIAGNOSIS — I10 Essential (primary) hypertension: Secondary | ICD-10-CM

## 2019-04-11 NOTE — Telephone Encounter (Signed)
I agree, thank you.

## 2019-04-12 ENCOUNTER — Telehealth: Payer: Self-pay | Admitting: Internal Medicine

## 2019-04-12 MED ORDER — HYDROCHLOROTHIAZIDE 25 MG PO TABS: 25.0000 mg | ORAL_TABLET | Freq: Every day | ORAL | 0 refills | Status: DC

## 2019-04-12 NOTE — Telephone Encounter (Signed)
Patient notified that HCTZ has been sent to pharmacy. She is very Patent attorney. HFU/labs sheduled for 05/06/2019 at 11115. Patient states she is careful not to have salt in diet and is good about elevating legs while sitting. Hubbard Hartshorn, RN, BSN

## 2019-04-12 NOTE — Telephone Encounter (Signed)
Reviewed recent visit and phone notes as well as D/C summary of hospitalist, it does appear that HCTZ was stopped at that hospitalzation but no clear indication of why.  I will prescribe HCTZ 25mg  daily will need repeat blood work in 2weeks to 1 month ideally could have an in office visit in that time frame.

## 2019-04-12 NOTE — Telephone Encounter (Signed)
Great, thank you!

## 2019-04-12 NOTE — Telephone Encounter (Signed)
Needs refill on fluid pill [phone]

## 2019-04-12 NOTE — Telephone Encounter (Signed)
No diuretic on current med list. Placed call to patient. States she was given lasix while hospitalized and since d/c her legs are swollen and feels urine output has decreased. Had some SHOB last evening while walking but none today. States she was on HCTZ in past and this helped. States she is waiting for phone call from Cardiology but nothing is scheduled. Requests refill on HCTZ at [facility] on [address]. Please advise. Hubbard Hartshorn, RN, BSN

## 2019-04-15 ENCOUNTER — Other Ambulatory Visit: Payer: Self-pay | Admitting: *Deleted

## 2019-04-15 DIAGNOSIS — I480 Paroxysmal atrial fibrillation: Secondary | ICD-10-CM | POA: Diagnosis not present

## 2019-04-15 DIAGNOSIS — J9601 Acute respiratory failure with hypoxia: Secondary | ICD-10-CM | POA: Diagnosis not present

## 2019-04-15 DIAGNOSIS — I10 Essential (primary) hypertension: Secondary | ICD-10-CM | POA: Diagnosis not present

## 2019-04-15 DIAGNOSIS — E119 Type 2 diabetes mellitus without complications: Secondary | ICD-10-CM | POA: Diagnosis not present

## 2019-04-15 DIAGNOSIS — U071 COVID-19: Secondary | ICD-10-CM | POA: Diagnosis not present

## 2019-04-15 DIAGNOSIS — E7800 Pure hypercholesterolemia, unspecified: Secondary | ICD-10-CM | POA: Diagnosis not present

## 2019-04-15 DIAGNOSIS — F329 Major depressive disorder, single episode, unspecified: Secondary | ICD-10-CM | POA: Diagnosis not present

## 2019-04-15 DIAGNOSIS — F419 Anxiety disorder, unspecified: Secondary | ICD-10-CM | POA: Diagnosis not present

## 2019-04-15 DIAGNOSIS — J1281 Pneumonia due to SARS-associated coronavirus: Secondary | ICD-10-CM | POA: Diagnosis not present

## 2019-04-15 NOTE — Telephone Encounter (Signed)
90 day request for HCTZ declined. Patient needs to follow up (already scheduled 6/8). If labs and BP ok at that visit, can give 90 day supply.

## 2019-04-18 ENCOUNTER — Ambulatory Visit (INDEPENDENT_AMBULATORY_CARE_PROVIDER_SITE_OTHER): Payer: Medicare HMO | Admitting: Cardiology

## 2019-04-18 ENCOUNTER — Ambulatory Visit: Payer: Medicare HMO | Admitting: Cardiology

## 2019-04-18 ENCOUNTER — Other Ambulatory Visit: Payer: Self-pay

## 2019-04-18 ENCOUNTER — Encounter: Payer: Self-pay | Admitting: Cardiology

## 2019-04-18 VITALS — BP 138/76 | HR 78 | Ht 68.0 in | Wt 310.0 lb

## 2019-04-18 DIAGNOSIS — U071 COVID-19: Secondary | ICD-10-CM

## 2019-04-18 DIAGNOSIS — I480 Paroxysmal atrial fibrillation: Secondary | ICD-10-CM | POA: Diagnosis not present

## 2019-04-18 DIAGNOSIS — I4819 Other persistent atrial fibrillation: Secondary | ICD-10-CM

## 2019-04-18 MED ORDER — APIXABAN 5 MG PO TABS: 5.0000 mg | ORAL_TABLET | Freq: Two times a day (BID) | ORAL | 2 refills | Status: DC

## 2019-04-18 MED ORDER — METOPROLOL TARTRATE 25 MG PO TABS: 25.0000 mg | ORAL_TABLET | Freq: Two times a day (BID) | ORAL | 2 refills | Status: DC

## 2019-04-18 MED ORDER — DILTIAZEM HCL ER COATED BEADS 360 MG PO CP24: 360.0000 mg | ORAL_CAPSULE | Freq: Every day | ORAL | 2 refills | Status: DC

## 2019-04-18 NOTE — Progress Notes (Signed)
Virtual Visit via Video Note   Subjective:   Kayla Roberts, female    DOB: 04-Jan-1956, 64 y.o.   MRN: [id number]   I connected with the patient on 04/18/19 by a video enabled telemedicine application and verified that I am speaking with the correct person using two identifiers.     I discussed the limitations of evaluation and management by telemedicine and the availability of in person appointments. The patient expressed understanding and agreed to proceed.   This visit type was conducted due to national recommendations for restrictions regarding the COVID-19 Pandemic (e.g. social distancing).  This format is felt to be most appropriate for this patient at this time.  All issues noted in this document were discussed and addressed.  No physical exam was performed (except for noted visual exam findings with Tele health visits).  The patient has consented to conduct a Tele health visit and understands insurance will be billed.     Chief complaint:  Atrial fibrillation  HPI  63 year old Caucasian female with hypertension, seasonal asthma, morbid obesity, COVID pneumonia in late 02/2019, new diagnosis of paroxysmal atrial fibrillation.  Patient developed atrial fibrillation while admitted at [facility] for COVID pneumonia.  She was started on metoprolol 25 mg twice daily, and diltiazem 360 mg daily with good rate control.  She was also started on Eliquis 5 mg twice daily for anticoagulation.  Since her discharge, she has had no fever, cough, myalgias.  She also denies chest pain or shortness of breath, although her mobility is limited as she recovers from [place].  However, she does endorse frequent palpitations, although heart rate is well controlled.  She has been compliant with metoprolol, diltiazem, and Eliquis.  She does have baseline anemia, with a sickle cell trait.  She has history of bleeding hemorrhoids for which she underwent colonoscopy in February 2020 by Dr. Watt Climes.   Although the report is not available to me, patient tells that she was told that she had no other major abnormalities on the colonoscopy.  Past Medical History:  Diagnosis Date  . Anxiety   . Asthma   . Depression   . GERD (gastroesophageal reflux disease)   . Hyperlipidemia   . Hypertension   . Morbid obesity ([place])      Past Surgical History:  Procedure Laterality Date  . CERVICAL DISCECTOMY  2002  . COLONOSCOPY WITH PROPOFOL N/A 01/18/2019   Procedure: COLONOSCOPY WITH PROPOFOL;  Surgeon: Clarene Essex, MD;  Location: [facility] ENDOSCOPY;  Service: Endoscopy;  Laterality: N/A;  . POLYPECTOMY  01/18/2019   Procedure: POLYPECTOMY;  Surgeon: Clarene Essex, MD;  Location: [facility] ENDOSCOPY;  Service: Endoscopy;;     Social History   Socioeconomic History  . Marital status: Widowed    Spouse name: Not on file  . Number of children: Not on file  . Years of education: Not on file  . Highest education level: Not on file  Occupational History  . Not on file  Social Needs  . Financial resource strain: Not on file  . Food insecurity:    Worry: Not on file    Inability: Not on file  . Transportation needs:    Medical: Not on file    Non-medical: Not on file  Tobacco Use  . Smoking status: Never Smoker  . Smokeless tobacco: Never Used  Substance and Sexual Activity  . Alcohol use: No  . Drug use: No  . Sexual activity: Not on file  Lifestyle  . Physical activity:  Days per week: Not on file    Minutes per session: Not on file  . Stress: Not on file  Relationships  . Social connections:    Talks on phone: Not on file    Gets together: Not on file    Attends religious service: Not on file    Active member of club or organization: Not on file    Attends meetings of clubs or organizations: Not on file    Relationship status: Not on file  . Intimate partner violence:    Fear of current or ex partner: Not on file    Emotionally abused: Not on file    Physically abused: Not on file     Forced sexual activity: Not on file  Other Topics Concern  . Not on file  Social History Narrative  . Not on file     Family History  Problem Relation Age of Onset  . Hypertension Mother   . Hyperlipidemia Mother   . Heart disease Father   . Hyperlipidemia Father   . Hypertension Father   . Hypertension Brother   . Heart disease Brother   . Hypertension Daughter   . Diabetes Sister      Current Outpatient Medications on File Prior to Visit  Medication Sig Dispense Refill  . albuterol (PROVENTIL HFA;VENTOLIN HFA) 108 (90 Base) MCG/ACT inhaler INHALE 2 PUFFS INTO THE LUNGS EVERY 6 HOURS AS NEEDED FOR WHEEZING OR SHORTNESS OF BREATH (Patient taking differently: Inhale 2 puffs into the lungs every 6 (six) hours as needed for wheezing or shortness of breath. ) 18 g 1  . apixaban (ELIQUIS) 5 MG TABS tablet Take 1 tablet (5 mg total) by mouth 2 (two) times daily. 60 tablet 0  . atorvastatin (LIPITOR) 20 MG tablet Take 1 tablet (20 mg total) by mouth daily. 30 tablet 11  . blood glucose meter kit and supplies Dispense based on patient and insurance preference. Use up to four times daily as directed. (FOR ICD-10 E10.9, E11.9). 1 each 0  . diltiazem (CARDIZEM CD) 360 MG 24 hr capsule Take 1 capsule (360 mg total) by mouth daily. 30 capsule 0  . Flaxseed, Linseed, (FLAX SEEDS PO) Take 1 tablet by mouth daily.    Marland Kitchen guaiFENesin-codeine 100-10 MG/5ML syrup Take 5 mLs by mouth at bedtime as needed for cough. 118 mL 0  . hydrochlorothiazide (HYDRODIURIL) 25 MG tablet Take 1 tablet (25 mg total) by mouth daily. 30 tablet 0  . hydrocortisone (ANUSOL-HC) 25 MG suppository Place 1 suppository (25 mg total) rectally every 12 (twelve) hours. 12 suppository 3  . metFORMIN (GLUCOPHAGE) 500 MG tablet Take 1 tablet (500 mg total) by mouth 2 (two) times daily with a meal. 60 tablet 0  . metoprolol tartrate (LOPRESSOR) 25 MG tablet Take 1 tablet (25 mg total) by mouth 2 (two) times daily. 60 tablet 0  . QVAR  REDIHALER 40 MCG/ACT inhaler INHALE 2 PUFFS INTO THE LUNGS TWICE DAILY 10.6 g 5  . vitamin C (VITAMIN C) 500 MG tablet Take 1 tablet (500 mg total) by mouth daily. 7 tablet 0  . zinc sulfate 220 (50 Zn) MG capsule Take 1 capsule (220 mg total) by mouth daily. 7 capsule 0   No current facility-administered medications on file prior to visit.     Cardiovascular studies:  EKG 03/28/2019: Afib w/RVR  Echocardiogram 03/31/2019: - Left ventricle: Global longitudinal LV strain is normal at -21.9% The cavity size was normal. There was mild concentric hypertrophy. Systolic  function was normal. The estimated ejection fraction was in the range of 60% to 65%. Wall motion was normal; there were no regional wall motion abnormalities. There was an increased relative contribution of atrial contraction to ventricular filling. Doppler parameters are consistent with abnormal left ventricular relaxation (grade 1 diastolic dysfunction). - Aortic valve: Trileaflet; mildly thickened, mildly calcified leaflets. - Mitral valve: Calcified annulus. - Tricuspid valve: There was trivial regurgitation. - Pulmonic valve: There was trivial regurgitation. - Pulmonary arteries: PA peak pressure: 32 mm Hg (S).  Recent labs: Results for EMMALOU, HUNGER (MRN [id number]) as of 04/18/2019 09:15  Ref. Range 04/08/2019 15:13  Sodium Latest Ref Range: 134 - 144 mmol/L 145 (H)  Potassium Latest Ref Range: 3.5 - 5.2 mmol/L 4.5  Chloride Latest Ref Range: 96 - 106 mmol/L 102  CO2 Latest Ref Range: 20 - 29 mmol/L 22  Glucose Latest Ref Range: 65 - 99 mg/dL 119 (H)  BUN Latest Ref Range: 8 - 27 mg/dL 13  Creatinine Latest Ref Range: 0.57 - 1.00 mg/dL 0.78  Calcium Latest Ref Range: 8.7 - 10.3 mg/dL 9.0  Anion gap Latest Ref Range: 10.0 - 18.0 mmol/L 21.0 (H)  BUN/Creatinine Ratio Latest Ref Range: 12 - 28  17  GFR, Est Non African American Latest Ref Range: >59 mL/min/1.73 81  GFR, Est African American  Latest Ref Range: >59 mL/min/1.73 93   Results for HILLARY, STRUSS (MRN [id number]) as of 04/18/2019 09:15  Ref. Range 04/08/2019 15:13  WBC Latest Ref Range: 3.4 - 10.8 x10E3/uL 5.1  RBC Latest Ref Range: 3.77 - 5.28 x10E6/uL 3.26 (L)  Hemoglobin Latest Ref Range: 11.1 - 15.9 g/dL 9.2 (L)  HCT Latest Ref Range: 34.0 - 46.6 % 27.4 (L)  MCV Latest Ref Range: 79 - 97 fL 84  MCH Latest Ref Range: 26.6 - 33.0 pg 28.2  MCHC Latest Ref Range: 31.5 - 35.7 g/dL 33.6  RDW Latest Ref Range: 11.7 - 15.4 % 15.1  Platelets Latest Ref Range: 150 - 450 x10E3/uL 286   Results for MELENIE, MINNIEAR (MRN [id number]) as of 04/18/2019 09:15  Ref. Range 04/08/2019 15:13  WBC Latest Ref Range: 3.4 - 10.8 x10E3/uL 5.1  RBC Latest Ref Range: 3.77 - 5.28 x10E6/uL 3.26 (L)  Hemoglobin Latest Ref Range: 11.1 - 15.9 g/dL 9.2 (L)  HCT Latest Ref Range: 34.0 - 46.6 % 27.4 (L)  MCV Latest Ref Range: 79 - 97 fL 84  MCH Latest Ref Range: 26.6 - 33.0 pg 28.2  MCHC Latest Ref Range: 31.5 - 35.7 g/dL 33.6  RDW Latest Ref Range: 11.7 - 15.4 % 15.1  Platelets Latest Ref Range: 150 - 450 x10E3/uL 286   Review of Systems  Constitution: Negative for decreased appetite, malaise/fatigue, weight gain and weight loss.  HENT: Negative for congestion.   Eyes: Negative for visual disturbance.  Cardiovascular: Positive for palpitations. Negative for chest pain, dyspnea on exertion, leg swelling and syncope.  Respiratory: Negative for cough.   Endocrine: Negative for cold intolerance.  Hematologic/Lymphatic: Does not bruise/bleed easily.  Skin: Negative for itching and rash.  Musculoskeletal: Negative for myalgias.  Gastrointestinal: Negative for abdominal pain, nausea and vomiting.  Genitourinary: Negative for dysuria.  Neurological: Negative for dizziness and weakness.  Psychiatric/Behavioral: The patient is not nervous/anxious.   All other systems reviewed and are negative.        Vitals:   04/18/19 0926  BP: 138/76   Pulse: 78   (Measured by the patient using a home BP monitor)  Observation/findings during video visit   Objective:    Physical Exam  Constitutional: She is oriented to person, place, and time. She appears well-developed and well-nourished. No distress.  Pulmonary/Chest: Effort normal.  Neurological: She is alert and oriented to person, place, and time.  Psychiatric: She has a normal mood and affect.  Nursing note and vitals reviewed.       Assessment & Recommendations:   63 year old Caucasian female with hypertension, seasonal asthma, morbid obesity, COVID pneumonia in late 02/2019, new diagnosis of paroxysmal atrial fibrillation.  Paroxysmal Afib: While rate is controlled, she is symptomatic with palpitations.  Continue metoprolol 25 mg bid, along with diltiazem 360 mg.  Continue Eliquis 5 mg bid.  We will plan on outpatient cardioversion in first week of June.  She will need call with test prior to that.  I have discussed the risks, benefits, as well as alternate options with the patient.  While she has history of hemorrhoids, she does not have any active bleeding and has relatively stable hemoglobin.  It would be reasonable to proceed with cardioversion and continue Eliquis indefinitely, unless any major bleeding issues occur.    I have talked to her about weight loss, as obesity is her biggest risk factor for atrial fibrillation.  She may also need sleep study at some point.   I will see her in the office for follow-up visit after cardioversion.    Nigel Mormon, MD [facility] Pager: [phone] Office: [phone] If no answer Cell [phone]

## 2019-04-19 DIAGNOSIS — J1281 Pneumonia due to SARS-associated coronavirus: Secondary | ICD-10-CM | POA: Diagnosis not present

## 2019-04-19 DIAGNOSIS — F419 Anxiety disorder, unspecified: Secondary | ICD-10-CM | POA: Diagnosis not present

## 2019-04-19 DIAGNOSIS — U071 COVID-19: Secondary | ICD-10-CM | POA: Diagnosis not present

## 2019-04-19 DIAGNOSIS — F329 Major depressive disorder, single episode, unspecified: Secondary | ICD-10-CM | POA: Diagnosis not present

## 2019-04-19 DIAGNOSIS — J9601 Acute respiratory failure with hypoxia: Secondary | ICD-10-CM | POA: Diagnosis not present

## 2019-04-19 DIAGNOSIS — E119 Type 2 diabetes mellitus without complications: Secondary | ICD-10-CM | POA: Diagnosis not present

## 2019-04-19 DIAGNOSIS — I480 Paroxysmal atrial fibrillation: Secondary | ICD-10-CM | POA: Diagnosis not present

## 2019-04-19 DIAGNOSIS — I10 Essential (primary) hypertension: Secondary | ICD-10-CM | POA: Diagnosis not present

## 2019-04-19 DIAGNOSIS — E7800 Pure hypercholesterolemia, unspecified: Secondary | ICD-10-CM | POA: Diagnosis not present

## 2019-04-24 DIAGNOSIS — I10 Essential (primary) hypertension: Secondary | ICD-10-CM | POA: Diagnosis not present

## 2019-04-24 DIAGNOSIS — E119 Type 2 diabetes mellitus without complications: Secondary | ICD-10-CM | POA: Diagnosis not present

## 2019-04-24 DIAGNOSIS — J1281 Pneumonia due to SARS-associated coronavirus: Secondary | ICD-10-CM | POA: Diagnosis not present

## 2019-04-24 DIAGNOSIS — E7800 Pure hypercholesterolemia, unspecified: Secondary | ICD-10-CM | POA: Diagnosis not present

## 2019-04-24 DIAGNOSIS — F419 Anxiety disorder, unspecified: Secondary | ICD-10-CM | POA: Diagnosis not present

## 2019-04-24 DIAGNOSIS — F329 Major depressive disorder, single episode, unspecified: Secondary | ICD-10-CM | POA: Diagnosis not present

## 2019-04-24 DIAGNOSIS — I480 Paroxysmal atrial fibrillation: Secondary | ICD-10-CM | POA: Diagnosis not present

## 2019-04-24 DIAGNOSIS — U071 COVID-19: Secondary | ICD-10-CM | POA: Diagnosis not present

## 2019-04-24 DIAGNOSIS — J9601 Acute respiratory failure with hypoxia: Secondary | ICD-10-CM | POA: Diagnosis not present

## 2019-04-25 ENCOUNTER — Other Ambulatory Visit: Payer: Self-pay

## 2019-04-25 DIAGNOSIS — R739 Hyperglycemia, unspecified: Secondary | ICD-10-CM

## 2019-04-25 DIAGNOSIS — E119 Type 2 diabetes mellitus without complications: Secondary | ICD-10-CM

## 2019-04-25 MED ORDER — METFORMIN HCL 1000 MG PO TABS: 1000.0000 mg | ORAL_TABLET | Freq: Two times a day (BID) | ORAL | 11 refills | Status: DC

## 2019-04-25 NOTE — Telephone Encounter (Signed)
Can you clarify what dose of metformin she is taking? Is it 500 mg twice a day? If so, I will increase the dose and send a new prescription. Please tell her not to take it three times a day.

## 2019-04-25 NOTE — Telephone Encounter (Signed)
Called  pt - stated she has been taking Metformin 3 times a day instead of 2 times a day b/c her BS's were high. And the pharmacy told her the ins co will not pay it b/c it is too early.  I called [facility] pharmacy;talked toTas - stated it will cost $47.00 if she pick it up before tomorrow.  I called pt back - informed of price. Stated her BS's are 29 ,117 113, 106, 121 when taking it 3 x a day. BS's are 159, 137, 167, 199 when taking it 2 x a day.

## 2019-04-25 NOTE — Telephone Encounter (Signed)
metFORMIN (GLUCOPHAGE) 500 MG tablet   REFILL REQUEST @  [id number] - [address], [place] AT [address] [phone] (Phone) [phone] (Fax)

## 2019-04-25 NOTE — Telephone Encounter (Signed)
Stated she takes Metformin 500 mg twice a day. Informed her doctor will increase dose, send a new rx; then she will not have to pay $47.00. I reiterated to take it only twice a day; she stated ok.

## 2019-04-25 NOTE — Addendum Note (Signed)
Addended by: Jodean Lima on: 04/25/2019 02:42 PM   Modules accepted: Orders

## 2019-04-25 NOTE — Telephone Encounter (Signed)
Send new prescription for 1,000 mg tablets twice a day. Thank you.

## 2019-04-26 ENCOUNTER — Other Ambulatory Visit (HOSPITAL_COMMUNITY)
Admission: RE | Admit: 2019-04-26 | Discharge: 2019-04-26 | Disposition: A | Discharge status: Home / Self Care | Payer: Medicare HMO | Source: Ambulatory Visit | Attending: Cardiology | Admitting: Cardiology

## 2019-04-26 ENCOUNTER — Telehealth: Payer: Self-pay | Admitting: *Deleted

## 2019-04-26 DIAGNOSIS — J1281 Pneumonia due to SARS-associated coronavirus: Secondary | ICD-10-CM | POA: Diagnosis not present

## 2019-04-26 DIAGNOSIS — F419 Anxiety disorder, unspecified: Secondary | ICD-10-CM | POA: Diagnosis not present

## 2019-04-26 DIAGNOSIS — Z1159 Encounter for screening for other viral diseases: Secondary | ICD-10-CM | POA: Insufficient documentation

## 2019-04-26 DIAGNOSIS — E119 Type 2 diabetes mellitus without complications: Secondary | ICD-10-CM | POA: Diagnosis not present

## 2019-04-26 DIAGNOSIS — F329 Major depressive disorder, single episode, unspecified: Secondary | ICD-10-CM | POA: Diagnosis not present

## 2019-04-26 DIAGNOSIS — J9601 Acute respiratory failure with hypoxia: Secondary | ICD-10-CM | POA: Diagnosis not present

## 2019-04-26 DIAGNOSIS — U071 COVID-19: Secondary | ICD-10-CM | POA: Diagnosis not present

## 2019-04-26 DIAGNOSIS — I480 Paroxysmal atrial fibrillation: Secondary | ICD-10-CM | POA: Diagnosis not present

## 2019-04-26 DIAGNOSIS — E7800 Pure hypercholesterolemia, unspecified: Secondary | ICD-10-CM | POA: Diagnosis not present

## 2019-04-26 DIAGNOSIS — I10 Essential (primary) hypertension: Secondary | ICD-10-CM | POA: Diagnosis not present

## 2019-04-26 NOTE — Telephone Encounter (Signed)
I called pt to inquire about her donating plasma to assist in the recover of other COVID-19 pts. Her voicemail had not been set up and no answered the phone.

## 2019-04-27 LAB — NOVEL CORONAVIRUS, NAA (HOSP ORDER, SEND-OUT TO REF LAB; TAT 18-24 HRS): SARS-CoV-2, NAA: NOT DETECTED

## 2019-04-29 DIAGNOSIS — J1281 Pneumonia due to SARS-associated coronavirus: Secondary | ICD-10-CM | POA: Diagnosis not present

## 2019-04-29 DIAGNOSIS — F419 Anxiety disorder, unspecified: Secondary | ICD-10-CM | POA: Diagnosis not present

## 2019-04-29 DIAGNOSIS — I480 Paroxysmal atrial fibrillation: Secondary | ICD-10-CM | POA: Diagnosis not present

## 2019-04-29 DIAGNOSIS — E7800 Pure hypercholesterolemia, unspecified: Secondary | ICD-10-CM | POA: Diagnosis not present

## 2019-04-29 DIAGNOSIS — J9601 Acute respiratory failure with hypoxia: Secondary | ICD-10-CM | POA: Diagnosis not present

## 2019-04-29 DIAGNOSIS — U071 COVID-19: Secondary | ICD-10-CM | POA: Diagnosis not present

## 2019-04-29 DIAGNOSIS — F329 Major depressive disorder, single episode, unspecified: Secondary | ICD-10-CM | POA: Diagnosis not present

## 2019-04-29 DIAGNOSIS — E119 Type 2 diabetes mellitus without complications: Secondary | ICD-10-CM | POA: Diagnosis not present

## 2019-04-29 DIAGNOSIS — I10 Essential (primary) hypertension: Secondary | ICD-10-CM | POA: Diagnosis not present

## 2019-04-29 NOTE — H&P (Addendum)
Chief complaint:  Atrial fibrillation  HPI  63 year old Caucasian female with hypertension, seasonal asthma, morbid obesity, COVID pneumonia in late 02/2019, new diagnosis of paroxysmal atrial fibrillation.  Patient developed atrial fibrillation while admitted at [facility] for COVID pneumonia.  She was started on metoprolol 25 mg twice daily, and diltiazem 360 mg daily with good rate control.  She was also started on Eliquis 5 mg twice daily for anticoagulation.  Since her discharge, she has had no fever, cough, myalgias.  She also denies chest pain or shortness of breath, although her mobility is limited as she recovers from [place].  However, she does endorse frequent palpitations, although heart rate is well controlled.  She has been compliant with metoprolol, diltiazem, and Eliquis.  She does have baseline anemia, with a sickle cell trait.  She has history of bleeding hemorrhoids for which she underwent colonoscopy in February 2020 by Dr. Watt Climes, with no major bleeding issues.       Past Medical History:  Diagnosis Date  . Anxiety   . Asthma   . Depression   . GERD (gastroesophageal reflux disease)   . Hyperlipidemia   . Hypertension   . Morbid obesity ([place])           Past Surgical History:  Procedure Laterality Date  . CERVICAL DISCECTOMY  2002  . COLONOSCOPY WITH PROPOFOL N/A 01/18/2019   Procedure: COLONOSCOPY WITH PROPOFOL;  Surgeon: Clarene Essex, MD;  Location: [facility];  Service: Endoscopy;  Laterality: N/A;  . POLYPECTOMY  01/18/2019   Procedure: POLYPECTOMY;  Surgeon: Clarene Essex, MD;  Location: [facility];  Service: Endoscopy;;     Social History        Socioeconomic History  . Marital status: Widowed    Spouse name: Not on file  . Number of children: Not on file  . Years of education: Not on file  . Highest education level: Not on file  Occupational History  . Not on file  Social Needs  . Financial resource strain:  Not on file  . Food insecurity:    Worry: Not on file    Inability: Not on file  . Transportation needs:    Medical: Not on file    Non-medical: Not on file  Tobacco Use  . Smoking status: Never Smoker  . Smokeless tobacco: Never Used  Substance and Sexual Activity  . Alcohol use: No  . Drug use: No  . Sexual activity: Not on file  Lifestyle  . Physical activity:    Days per week: Not on file    Minutes per session: Not on file  . Stress: Not on file  Relationships  . Social connections:    Talks on phone: Not on file    Gets together: Not on file    Attends religious service: Not on file    Active member of club or organization: Not on file    Attends meetings of clubs or organizations: Not on file    Relationship status: Not on file  . Intimate partner violence:    Fear of current or ex partner: Not on file    Emotionally abused: Not on file    Physically abused: Not on file    Forced sexual activity: Not on file  Other Topics Concern  . Not on file  Social History Narrative  . Not on file          Family History  Problem Relation Age of Onset  . Hypertension Mother   .  Hyperlipidemia Mother   . Heart disease Father   . Hyperlipidemia Father   . Hypertension Father   . Hypertension Brother   . Heart disease Brother   . Hypertension Daughter   . Diabetes Sister            Current Outpatient Medications on File Prior to Visit  Medication Sig Dispense Refill  . albuterol (PROVENTIL HFA;VENTOLIN HFA) 108 (90 Base) MCG/ACT inhaler INHALE 2 PUFFS INTO THE LUNGS EVERY 6 HOURS AS NEEDED FOR WHEEZING OR SHORTNESS OF BREATH (Patient taking differently: Inhale 2 puffs into the lungs every 6 (six) hours as needed for wheezing or shortness of breath. ) 18 g 1  . apixaban (ELIQUIS) 5 MG TABS tablet Take 1 tablet (5 mg total) by mouth 2 (two) times daily. 60 tablet 0  . atorvastatin (LIPITOR) 20 MG tablet Take 1 tablet (20  mg total) by mouth daily. 30 tablet 11  . blood glucose meter kit and supplies Dispense based on patient and insurance preference. Use up to four times daily as directed. (FOR ICD-10 E10.9, E11.9). 1 each 0  . diltiazem (CARDIZEM CD) 360 MG 24 hr capsule Take 1 capsule (360 mg total) by mouth daily. 30 capsule 0  . Flaxseed, Linseed, (FLAX SEEDS PO) Take 1 tablet by mouth daily.    Marland Kitchen guaiFENesin-codeine 100-10 MG/5ML syrup Take 5 mLs by mouth at bedtime as needed for cough. 118 mL 0  . hydrochlorothiazide (HYDRODIURIL) 25 MG tablet Take 1 tablet (25 mg total) by mouth daily. 30 tablet 0  . hydrocortisone (ANUSOL-HC) 25 MG suppository Place 1 suppository (25 mg total) rectally every 12 (twelve) hours. 12 suppository 3  . metFORMIN (GLUCOPHAGE) 500 MG tablet Take 1 tablet (500 mg total) by mouth 2 (two) times daily with a meal. 60 tablet 0  . metoprolol tartrate (LOPRESSOR) 25 MG tablet Take 1 tablet (25 mg total) by mouth 2 (two) times daily. 60 tablet 0  . QVAR REDIHALER 40 MCG/ACT inhaler INHALE 2 PUFFS INTO THE LUNGS TWICE DAILY 10.6 g 5  . vitamin C (VITAMIN C) 500 MG tablet Take 1 tablet (500 mg total) by mouth daily. 7 tablet 0  . zinc sulfate 220 (50 Zn) MG capsule Take 1 capsule (220 mg total) by mouth daily. 7 capsule 0   No current facility-administered medications on file prior to visit.     Cardiovascular studies:  EKG04/30/2020: Afib w/RVR  Echocardiogram05/01/2019: - Left ventricle: Global longitudinal LV strain is normal at -21.9% The cavity size was normal. There was mild concentric hypertrophy. Systolic function was normal. The estimated ejection fraction was in the range of 60% to 65%. Wall motion was normal; there were no regional wall motion abnormalities. There was an increased relative contribution of atrial contraction to ventricular filling. Doppler parameters are consistent with abnormal left ventricular relaxation (grade 1 diastolic  dysfunction). - Aortic valve: Trileaflet; mildly thickened, mildly calcified leaflets. - Mitral valve: Calcified annulus. - Tricuspid valve: There was trivial regurgitation. - Pulmonic valve: There was trivial regurgitation. - Pulmonary arteries: PA peak pressure: 32 mm Hg (S).  Recent labs: Results for JAILENE, CUPIT (MRN [id number]) as of 04/18/2019 09:15  Ref. Range 04/08/2019 15:13  Sodium Latest Ref Range: 134 - 144 mmol/L 145 (H)  Potassium Latest Ref Range: 3.5 - 5.2 mmol/L 4.5  Chloride Latest Ref Range: 96 - 106 mmol/L 102  CO2 Latest Ref Range: 20 - 29 mmol/L 22  Glucose Latest Ref Range: 65 - 99 mg/dL 119 (H)  BUN Latest Ref Range: 8 - 27 mg/dL 13  Creatinine Latest Ref Range: 0.57 - 1.00 mg/dL 0.78  Calcium Latest Ref Range: 8.7 - 10.3 mg/dL 9.0  Anion gap Latest Ref Range: 10.0 - 18.0 mmol/L 21.0 (H)  BUN/Creatinine Ratio Latest Ref Range: 12 - 28  17  GFR, Est Non African American Latest Ref Range: >59 mL/min/1.73 81  GFR, Est African American Latest Ref Range: >59 mL/min/1.73 93   Results for SHALLON, YAKLIN (MRN [id number]) as of 04/18/2019 09:15  Ref. Range 04/08/2019 15:13  WBC Latest Ref Range: 3.4 - 10.8 x10E3/uL 5.1  RBC Latest Ref Range: 3.77 - 5.28 x10E6/uL 3.26 (L)  Hemoglobin Latest Ref Range: 11.1 - 15.9 g/dL 9.2 (L)  HCT Latest Ref Range: 34.0 - 46.6 % 27.4 (L)  MCV Latest Ref Range: 79 - 97 fL 84  MCH Latest Ref Range: 26.6 - 33.0 pg 28.2  MCHC Latest Ref Range: 31.5 - 35.7 g/dL 33.6  RDW Latest Ref Range: 11.7 - 15.4 % 15.1  Platelets Latest Ref Range: 150 - 450 x10E3/uL 286   Results for JAILEN, LUNG (MRN [id number]) as of 04/18/2019 09:15  Ref. Range 04/08/2019 15:13  WBC Latest Ref Range: 3.4 - 10.8 x10E3/uL 5.1  RBC Latest Ref Range: 3.77 - 5.28 x10E6/uL 3.26 (L)  Hemoglobin Latest Ref Range: 11.1 - 15.9 g/dL 9.2 (L)  HCT Latest Ref Range: 34.0 - 46.6 % 27.4 (L)  MCV Latest Ref Range: 79 - 97 fL 84  MCH Latest Ref Range: 26.6 - 33.0  pg 28.2  MCHC Latest Ref Range: 31.5 - 35.7 g/dL 33.6  RDW Latest Ref Range: 11.7 - 15.4 % 15.1  Platelets Latest Ref Range: 150 - 450 x10E3/uL 286   Review of Systems  Constitution: Negative for decreased appetite, malaise/fatigue, weight gain and weight loss.  HENT: Negative for congestion.   Eyes: Negative for visual disturbance.  Cardiovascular: Positive for palpitations. Negative for chest pain, dyspnea on exertion, leg swelling and syncope.  Respiratory: Negative for cough.   Endocrine: Negative for cold intolerance.  Hematologic/Lymphatic: Does not bruise/bleed easily.  Skin: Negative for itching and rash.  Musculoskeletal: Negative for myalgias.  Gastrointestinal: Negative for abdominal pain, nausea and vomiting.  Genitourinary: Negative for dysuria.  Neurological: Negative for dizziness and weakness.  Psychiatric/Behavioral: The patient is not nervous/anxious.   All other systems reviewed and are negative.           Vitals:   04/18/19 0926  BP: 138/76  Pulse: 78   (Measured by the patient using a home BP monitor)   Observation/findings during video visit   Objective:   Objective    Physical Exam  Physical Exam  Constitutional: She is oriented to person, place, and time. She appears well-developed and well-nourished. No distress.  Morbid obesity  HENT:  Head: Normocephalic and atraumatic.  Eyes: Pupils are equal, round, and reactive to light. Conjunctivae are normal.  Neck: No JVD present.  Cardiovascular: Normal rate, regular rhythm and intact distal pulses.  Pulmonary/Chest: Effort normal and breath sounds normal. She has no wheezes. She has no rales.  Abdominal: Soft. Bowel sounds are normal. There is no rebound.  Musculoskeletal:        General: Edema (1+ b/l) present.  Lymphadenopathy:    She has no cervical adenopathy.  Neurological: She is alert and oriented to person, place, and time. No cranial nerve deficit.  Skin: Skin is warm and  dry.  Psychiatric: She has a normal mood and affect.  Nursing note and vitals reviewed.        Assessment & Recommendations:   63 year old Caucasian female with hypertension, seasonal asthma, morbid obesity, COVID pneumonia in late 02/2019, new diagnosis of paroxysmal atrial fibrillation.  Paroxysmal Afib: EKG today shows normal sinus rhythm. Cardioversion canceled.  CHA2DS2VASc score 3, annual stroke risk 3%. Continue Eliquis 5 mg bid.  Continue metoprolol 25 mg bid, diltiazem 360 mg daily. Counseled regarding diet and lifestyle modifications. Will arrange outpatient stress test to evaluate for ischemia.  Hypertension: Uncontrolled. Switch HCTZ 25 mg daily to spironolactone 50 mg daily. Will check outpatient BMP in 10 days.    Nigel Mormon, MD [facility] Pager: [phone] Office: [phone] If no answer Cell [phone]

## 2019-04-30 ENCOUNTER — Ambulatory Visit (HOSPITAL_COMMUNITY)
Admission: RE | Admit: 2019-04-30 | Discharge: 2019-04-30 | Disposition: A | Discharge status: Home / Self Care | Payer: Medicare HMO | Attending: Cardiology | Admitting: Cardiology

## 2019-04-30 ENCOUNTER — Ambulatory Visit (HOSPITAL_COMMUNITY): Payer: Medicare HMO | Admitting: Registered Nurse

## 2019-04-30 ENCOUNTER — Encounter (HOSPITAL_COMMUNITY)
Admission: RE | Disposition: A | Discharge status: Home / Self Care | Payer: Self-pay | Source: Home / Self Care | Attending: Cardiology

## 2019-04-30 ENCOUNTER — Other Ambulatory Visit: Payer: Self-pay

## 2019-04-30 ENCOUNTER — Other Ambulatory Visit: Payer: Self-pay | Admitting: Cardiology

## 2019-04-30 ENCOUNTER — Encounter (HOSPITAL_COMMUNITY): Payer: Self-pay | Admitting: *Deleted

## 2019-04-30 DIAGNOSIS — I10 Essential (primary) hypertension: Secondary | ICD-10-CM | POA: Diagnosis not present

## 2019-04-30 DIAGNOSIS — Z539 Procedure and treatment not carried out, unspecified reason: Secondary | ICD-10-CM | POA: Insufficient documentation

## 2019-04-30 DIAGNOSIS — I480 Paroxysmal atrial fibrillation: Secondary | ICD-10-CM | POA: Insufficient documentation

## 2019-04-30 DIAGNOSIS — Z8249 Family history of ischemic heart disease and other diseases of the circulatory system: Secondary | ICD-10-CM | POA: Diagnosis not present

## 2019-04-30 DIAGNOSIS — Z79899 Other long term (current) drug therapy: Secondary | ICD-10-CM | POA: Diagnosis not present

## 2019-04-30 DIAGNOSIS — I4819 Other persistent atrial fibrillation: Secondary | ICD-10-CM | POA: Diagnosis not present

## 2019-04-30 DIAGNOSIS — E785 Hyperlipidemia, unspecified: Secondary | ICD-10-CM | POA: Diagnosis not present

## 2019-04-30 DIAGNOSIS — K219 Gastro-esophageal reflux disease without esophagitis: Secondary | ICD-10-CM | POA: Diagnosis not present

## 2019-04-30 DIAGNOSIS — Z7984 Long term (current) use of oral hypoglycemic drugs: Secondary | ICD-10-CM | POA: Diagnosis not present

## 2019-04-30 DIAGNOSIS — D573 Sickle-cell trait: Secondary | ICD-10-CM | POA: Insufficient documentation

## 2019-04-30 DIAGNOSIS — Z7951 Long term (current) use of inhaled steroids: Secondary | ICD-10-CM | POA: Diagnosis not present

## 2019-04-30 DIAGNOSIS — F329 Major depressive disorder, single episode, unspecified: Secondary | ICD-10-CM | POA: Diagnosis not present

## 2019-04-30 DIAGNOSIS — F419 Anxiety disorder, unspecified: Secondary | ICD-10-CM | POA: Insufficient documentation

## 2019-04-30 DIAGNOSIS — Z7901 Long term (current) use of anticoagulants: Secondary | ICD-10-CM | POA: Insufficient documentation

## 2019-04-30 SURGERY — CANCELLED PROCEDURE

## 2019-04-30 MED ORDER — SPIRONOLACTONE 50 MG PO TABS: 50.0000 mg | ORAL_TABLET | Freq: Every day | ORAL | 2 refills | Status: DC

## 2019-04-30 NOTE — Progress Notes (Signed)
Cardioversion cancelled for self conversion, Dr. Virgina Jock saw patient at bedside.

## 2019-05-01 DIAGNOSIS — E7800 Pure hypercholesterolemia, unspecified: Secondary | ICD-10-CM | POA: Diagnosis not present

## 2019-05-01 DIAGNOSIS — F329 Major depressive disorder, single episode, unspecified: Secondary | ICD-10-CM | POA: Diagnosis not present

## 2019-05-01 DIAGNOSIS — I480 Paroxysmal atrial fibrillation: Secondary | ICD-10-CM | POA: Diagnosis not present

## 2019-05-01 DIAGNOSIS — J1281 Pneumonia due to SARS-associated coronavirus: Secondary | ICD-10-CM | POA: Diagnosis not present

## 2019-05-01 DIAGNOSIS — E119 Type 2 diabetes mellitus without complications: Secondary | ICD-10-CM | POA: Diagnosis not present

## 2019-05-01 DIAGNOSIS — J9601 Acute respiratory failure with hypoxia: Secondary | ICD-10-CM | POA: Diagnosis not present

## 2019-05-01 DIAGNOSIS — U071 COVID-19: Secondary | ICD-10-CM | POA: Diagnosis not present

## 2019-05-01 DIAGNOSIS — I10 Essential (primary) hypertension: Secondary | ICD-10-CM | POA: Diagnosis not present

## 2019-05-01 DIAGNOSIS — F419 Anxiety disorder, unspecified: Secondary | ICD-10-CM | POA: Diagnosis not present

## 2019-05-02 DIAGNOSIS — J9601 Acute respiratory failure with hypoxia: Secondary | ICD-10-CM | POA: Diagnosis not present

## 2019-05-02 DIAGNOSIS — I480 Paroxysmal atrial fibrillation: Secondary | ICD-10-CM | POA: Diagnosis not present

## 2019-05-02 DIAGNOSIS — I10 Essential (primary) hypertension: Secondary | ICD-10-CM | POA: Diagnosis not present

## 2019-05-02 DIAGNOSIS — U071 COVID-19: Secondary | ICD-10-CM | POA: Diagnosis not present

## 2019-05-02 DIAGNOSIS — J1281 Pneumonia due to SARS-associated coronavirus: Secondary | ICD-10-CM | POA: Diagnosis not present

## 2019-05-02 DIAGNOSIS — F419 Anxiety disorder, unspecified: Secondary | ICD-10-CM | POA: Diagnosis not present

## 2019-05-02 DIAGNOSIS — E7800 Pure hypercholesterolemia, unspecified: Secondary | ICD-10-CM | POA: Diagnosis not present

## 2019-05-02 DIAGNOSIS — F329 Major depressive disorder, single episode, unspecified: Secondary | ICD-10-CM | POA: Diagnosis not present

## 2019-05-02 DIAGNOSIS — E119 Type 2 diabetes mellitus without complications: Secondary | ICD-10-CM | POA: Diagnosis not present

## 2019-05-03 DIAGNOSIS — J9601 Acute respiratory failure with hypoxia: Secondary | ICD-10-CM | POA: Diagnosis not present

## 2019-05-03 DIAGNOSIS — U071 COVID-19: Secondary | ICD-10-CM | POA: Diagnosis not present

## 2019-05-03 DIAGNOSIS — I10 Essential (primary) hypertension: Secondary | ICD-10-CM | POA: Diagnosis not present

## 2019-05-03 DIAGNOSIS — I480 Paroxysmal atrial fibrillation: Secondary | ICD-10-CM | POA: Diagnosis not present

## 2019-05-03 DIAGNOSIS — F329 Major depressive disorder, single episode, unspecified: Secondary | ICD-10-CM | POA: Diagnosis not present

## 2019-05-03 DIAGNOSIS — F419 Anxiety disorder, unspecified: Secondary | ICD-10-CM | POA: Diagnosis not present

## 2019-05-03 DIAGNOSIS — E7800 Pure hypercholesterolemia, unspecified: Secondary | ICD-10-CM | POA: Diagnosis not present

## 2019-05-03 DIAGNOSIS — J1281 Pneumonia due to SARS-associated coronavirus: Secondary | ICD-10-CM | POA: Diagnosis not present

## 2019-05-03 DIAGNOSIS — E119 Type 2 diabetes mellitus without complications: Secondary | ICD-10-CM | POA: Diagnosis not present

## 2019-05-06 ENCOUNTER — Ambulatory Visit (INDEPENDENT_AMBULATORY_CARE_PROVIDER_SITE_OTHER): Payer: Medicare HMO | Admitting: Internal Medicine

## 2019-05-06 ENCOUNTER — Encounter: Payer: Self-pay | Admitting: Internal Medicine

## 2019-05-06 ENCOUNTER — Other Ambulatory Visit: Payer: Self-pay | Admitting: Internal Medicine

## 2019-05-06 ENCOUNTER — Other Ambulatory Visit: Payer: Self-pay

## 2019-05-06 VITALS — BP 129/66 | HR 80 | Temp 98.2°F | Wt 320.6 lb

## 2019-05-06 DIAGNOSIS — K649 Unspecified hemorrhoids: Secondary | ICD-10-CM | POA: Diagnosis not present

## 2019-05-06 DIAGNOSIS — R739 Hyperglycemia, unspecified: Secondary | ICD-10-CM

## 2019-05-06 DIAGNOSIS — I4819 Other persistent atrial fibrillation: Secondary | ICD-10-CM

## 2019-05-06 DIAGNOSIS — Z7901 Long term (current) use of anticoagulants: Secondary | ICD-10-CM

## 2019-05-06 DIAGNOSIS — Z8701 Personal history of pneumonia (recurrent): Secondary | ICD-10-CM | POA: Diagnosis not present

## 2019-05-06 DIAGNOSIS — I480 Paroxysmal atrial fibrillation: Secondary | ICD-10-CM | POA: Diagnosis not present

## 2019-05-06 DIAGNOSIS — Z8619 Personal history of other infectious and parasitic diseases: Secondary | ICD-10-CM

## 2019-05-06 DIAGNOSIS — I10 Essential (primary) hypertension: Secondary | ICD-10-CM

## 2019-05-06 DIAGNOSIS — E119 Type 2 diabetes mellitus without complications: Secondary | ICD-10-CM

## 2019-05-06 DIAGNOSIS — D649 Anemia, unspecified: Secondary | ICD-10-CM | POA: Diagnosis not present

## 2019-05-06 DIAGNOSIS — D573 Sickle-cell trait: Secondary | ICD-10-CM

## 2019-05-06 DIAGNOSIS — Z79899 Other long term (current) drug therapy: Secondary | ICD-10-CM

## 2019-05-06 DIAGNOSIS — Z7984 Long term (current) use of oral hypoglycemic drugs: Secondary | ICD-10-CM

## 2019-05-06 LAB — POCT GLYCOSYLATED HEMOGLOBIN (HGB A1C): Hemoglobin A1C: 6.4 % — AB (ref 4.0–5.6)

## 2019-05-06 LAB — GLUCOSE, CAPILLARY: Glucose-Capillary: 203 mg/dL — ABNORMAL HIGH (ref 70–99)

## 2019-05-06 MED ORDER — DILTIAZEM HCL ER COATED BEADS 180 MG PO CP24: 180.0000 mg | ORAL_CAPSULE | Freq: Two times a day (BID) | ORAL | 1 refills | Status: DC

## 2019-05-06 NOTE — Patient Instructions (Addendum)
Thank you for allowing [place] to care for you  For your heart Rhythm - Continue current medications - We have changed one medication, diltiazem. Take 180mg  twice a day. This has been sent to your pharmacy. Stop taking old dose of this medication. - Follow up with cardiology  For your blood pressure - Continue current medications - Follow up with cardiology  For your high blood sugars - A1c looks good today - Continue metformin  For you low blood counts (Anemia) - We are rechecking labs today  You will be contacted with lab results  Follow up with PCP in 3 months

## 2019-05-06 NOTE — Assessment & Plan Note (Signed)
BP 129/66 today. At recent encounter for cardioversion (not performed due to spontaneous cardioversion) BP was noted to be elevated and she was switched from HCTZ to Spironolactone by cardiology. They have a follow up scheduled for labs. BP well controlled, will continue on current regimen. - Continue Metoprolol 25mg  twice daily - Cardizem changed to 180mg  BID - Spironolactone 50mg  Daily - Follow up with cardiolgy

## 2019-05-06 NOTE — Progress Notes (Signed)
   CC: Hospital Follow up, HTN, Afib, Diabetes, Anemia  HPI:   [name] is a 63 y.o. F with PMHx listed below presenting for Hospital Follow up, HTN, Afib, Diabetes, Anemia. Please see the A&P for the status of the patient's chronic medical problems.  Past Medical History:  Diagnosis Date  . Anxiety   . Asthma   . Depression   . GERD (gastroesophageal reflux disease)   . Hyperlipidemia   . Hypertension   . Morbid obesity ([place])    Review of Systems:  Performed and all others negative.  Physical Exam:  Vitals:   05/06/19 1112  BP: 129/66  Pulse: 80  Temp: 98.2 F (36.8 C)  TempSrc: Oral  SpO2: 98%  Weight: (!) 320 lb 9.6 oz (145.4 kg)   Physical Exam Constitutional:      General: She is not in acute distress.    Appearance: Normal appearance.  Cardiovascular:     Rate and Rhythm: Normal rate and regular rhythm.     Pulses: Normal pulses.     Heart sounds: Normal heart sounds.  Pulmonary:     Effort: Pulmonary effort is normal. No respiratory distress.     Breath sounds: Normal breath sounds.  Abdominal:     General: Bowel sounds are normal. There is no distension.     Palpations: Abdomen is soft.     Tenderness: There is no abdominal tenderness.  Musculoskeletal:        General: No swelling or deformity.     Comments: Mild-mod bilateral pedal edema  Skin:    General: Skin is warm and dry.  Neurological:     General: No focal deficit present.     Mental Status: Mental status is at baseline.    Assessment & Plan:   See Encounters Tab for problem based charting.  Patient discussed with Dr. Daryll Drown

## 2019-05-06 NOTE — Assessment & Plan Note (Addendum)
Patient with hx of hyperglycemia, will update to diabetes today. Started on Metformin. A1c today remains elevated at 6.5 but improve from previous of 7.0. Tolerating metformin well. - Continue Metformin 1000mg  BID - Follow up in 3 months with PCP

## 2019-05-06 NOTE — Assessment & Plan Note (Signed)
Patient developed A Fib during hospitalization in April 2020 for COVID PNA. She was schedule for a cardioversion last week but had spontaneously converted. She states she has been getting palpitation in the evening and has been taking her Diltiazem BID to prevent this, which has worked. The dose of 360 BID is too high, will re prescribed as 180 BID. Regular Rhythm on exam today. - Change Diltiazem to 180mg  BID - Continue metoprolol 25mg  BID and Eliquis 5mg  BID - Follow up with cardiology on 6/29

## 2019-05-06 NOTE — Assessment & Plan Note (Addendum)
Patient has history of Anemia, Sickle cell trait, and hemorrhoidal bleeding. She has had recent gradual downtrend in Hgb over the past month. She reports intermittent hemorrhoidal bleeding weekly. Denies dyspnea or lightheadedness. Will recheck CBC and Iron studies today given recent downtrend from 11 to 9 and hemorrhoidal bleeding. - CBC - Iron, Ferritin  ADDENDUM: CBC showed Hgb trending up from 9.2 to 10.4 and Fe/Ferritin at low end of normal at 31 and 24 respectively. Contacted patient and informed her of these results as well as new prescription for Iron supplement. - Ferrous Sulfate 325mg  QoD

## 2019-05-07 LAB — CBC
Hematocrit: 31.7 % — ABNORMAL LOW (ref 34.0–46.6)
Hemoglobin: 10.4 g/dL — ABNORMAL LOW (ref 11.1–15.9)
MCH: 26.4 pg — ABNORMAL LOW (ref 26.6–33.0)
MCHC: 32.8 g/dL (ref 31.5–35.7)
MCV: 81 fL (ref 79–97)
Platelets: 594 10*3/uL — ABNORMAL HIGH (ref 150–450)
RBC: 3.94 x10E6/uL (ref 3.77–5.28)
RDW: 15 % (ref 11.7–15.4)
WBC: 5 10*3/uL (ref 3.4–10.8)

## 2019-05-07 LAB — FERRITIN: Ferritin: 24 ng/mL (ref 15–150)

## 2019-05-07 LAB — IRON: Iron: 31 ug/dL (ref 27–139)

## 2019-05-07 NOTE — Telephone Encounter (Signed)
Please sent refill request to Dr. Trilby Drummer who prescribed it yesterday. Thanks!

## 2019-05-07 NOTE — Progress Notes (Signed)
Internal Medicine Clinic Attending  Case discussed with Dr. Melvin  at the time of the visit.  We reviewed the resident's history and exam and pertinent patient test results.  I agree with the assessment, diagnosis, and plan of care documented in the resident's note.  

## 2019-05-07 NOTE — Telephone Encounter (Signed)
Declined. 30 day supply provided as patient has follow up with her cardiologist with in the next 30 days and they may make changes to her regimen.  Pearson Grippe

## 2019-05-08 ENCOUNTER — Telehealth: Payer: Self-pay | Admitting: Internal Medicine

## 2019-05-08 DIAGNOSIS — D649 Anemia, unspecified: Secondary | ICD-10-CM

## 2019-05-08 MED ORDER — FERROUS SULFATE 325 (65 FE) MG PO TABS: 325.0000 mg | ORAL_TABLET | ORAL | 3 refills | Status: AC

## 2019-05-08 NOTE — Telephone Encounter (Signed)
Contacted patient regarding CBC and Iron studies. She was informed that her Hgb remains below normal but has improved to 10.4. She was also informed that her Iron is on the low end of normal and that she would be prescribed Iron supplementation to take every other day. - Ferrous Sulfate 325mg  QoD

## 2019-05-09 DIAGNOSIS — U071 COVID-19: Secondary | ICD-10-CM | POA: Diagnosis not present

## 2019-05-09 DIAGNOSIS — I10 Essential (primary) hypertension: Secondary | ICD-10-CM | POA: Diagnosis not present

## 2019-05-09 DIAGNOSIS — J1281 Pneumonia due to SARS-associated coronavirus: Secondary | ICD-10-CM | POA: Diagnosis not present

## 2019-05-09 DIAGNOSIS — E119 Type 2 diabetes mellitus without complications: Secondary | ICD-10-CM | POA: Diagnosis not present

## 2019-05-09 DIAGNOSIS — F419 Anxiety disorder, unspecified: Secondary | ICD-10-CM | POA: Diagnosis not present

## 2019-05-09 DIAGNOSIS — F329 Major depressive disorder, single episode, unspecified: Secondary | ICD-10-CM | POA: Diagnosis not present

## 2019-05-09 DIAGNOSIS — E7800 Pure hypercholesterolemia, unspecified: Secondary | ICD-10-CM | POA: Diagnosis not present

## 2019-05-09 DIAGNOSIS — I480 Paroxysmal atrial fibrillation: Secondary | ICD-10-CM | POA: Diagnosis not present

## 2019-05-09 DIAGNOSIS — J9601 Acute respiratory failure with hypoxia: Secondary | ICD-10-CM | POA: Diagnosis not present

## 2019-05-10 ENCOUNTER — Ambulatory Visit: Payer: Medicare HMO | Admitting: Cardiology

## 2019-05-10 DIAGNOSIS — F419 Anxiety disorder, unspecified: Secondary | ICD-10-CM | POA: Diagnosis not present

## 2019-05-10 DIAGNOSIS — I480 Paroxysmal atrial fibrillation: Secondary | ICD-10-CM | POA: Diagnosis not present

## 2019-05-10 DIAGNOSIS — J1281 Pneumonia due to SARS-associated coronavirus: Secondary | ICD-10-CM | POA: Diagnosis not present

## 2019-05-10 DIAGNOSIS — F329 Major depressive disorder, single episode, unspecified: Secondary | ICD-10-CM | POA: Diagnosis not present

## 2019-05-10 DIAGNOSIS — E119 Type 2 diabetes mellitus without complications: Secondary | ICD-10-CM | POA: Diagnosis not present

## 2019-05-10 DIAGNOSIS — E7800 Pure hypercholesterolemia, unspecified: Secondary | ICD-10-CM | POA: Diagnosis not present

## 2019-05-10 DIAGNOSIS — J9601 Acute respiratory failure with hypoxia: Secondary | ICD-10-CM | POA: Diagnosis not present

## 2019-05-10 DIAGNOSIS — I10 Essential (primary) hypertension: Secondary | ICD-10-CM | POA: Diagnosis not present

## 2019-05-10 DIAGNOSIS — U071 COVID-19: Secondary | ICD-10-CM | POA: Diagnosis not present

## 2019-05-11 IMAGING — CT CT HEAD W/O CM
3 series · 15 of 47 positions shown, 18 images · non-contrast
Comparison: Prior CT from 02/08/2006.

CLINICAL DATA: Initial evaluation for acute frontal headache.

EXAM:
CT HEAD WITHOUT CONTRAST
TECHNIQUE: Contiguous axial images were obtained from the base of the skull
through the vertex without intravenous contrast.

[Series 2: head wo · axial · 0.47mm/px · z∈[-95,+40]mm · 9 of 33 slices shown, 12 images]
[im 3/33  brain]
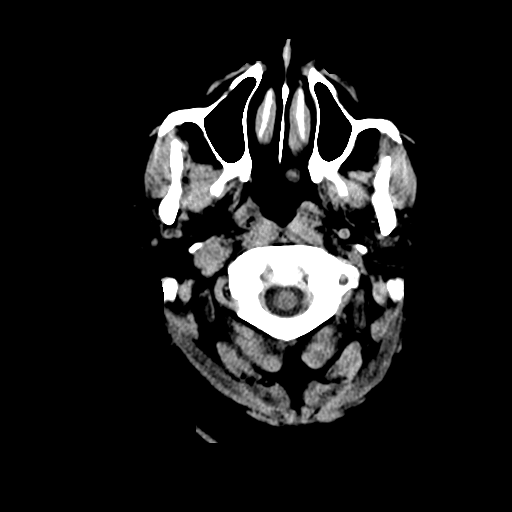
[im 3/33  bone]
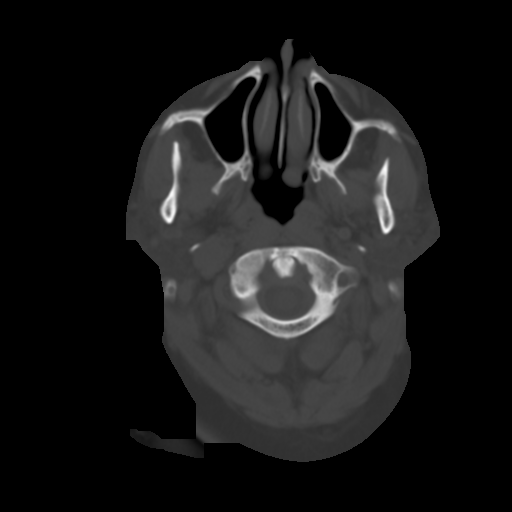
[im 6/33  brain]
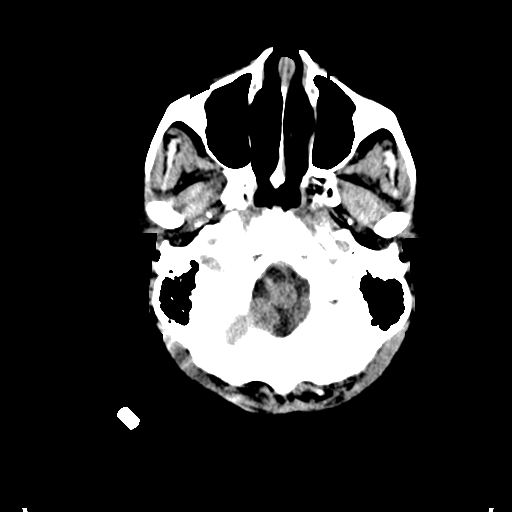
[im 9/33  brain]
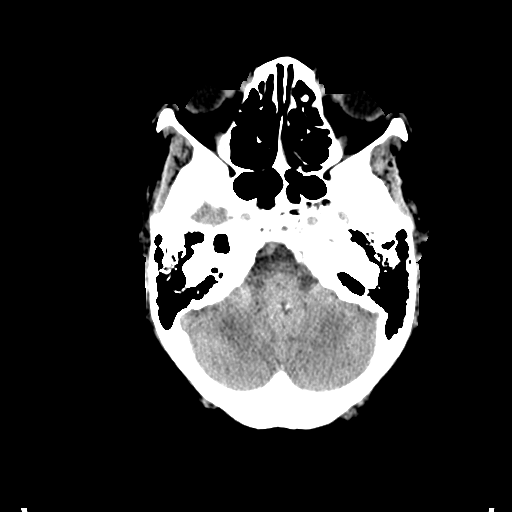
[im 13/33  brain]
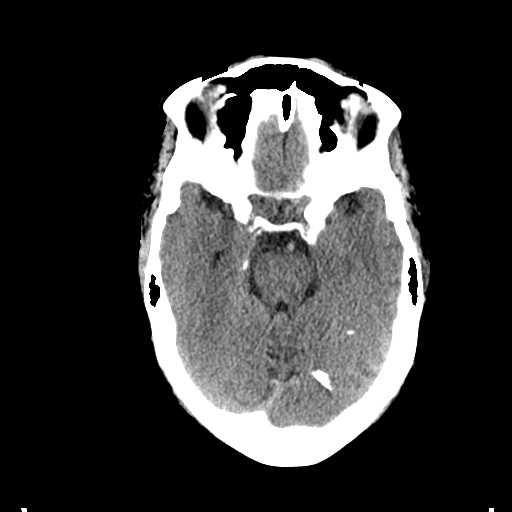
[im 17/33  brain]
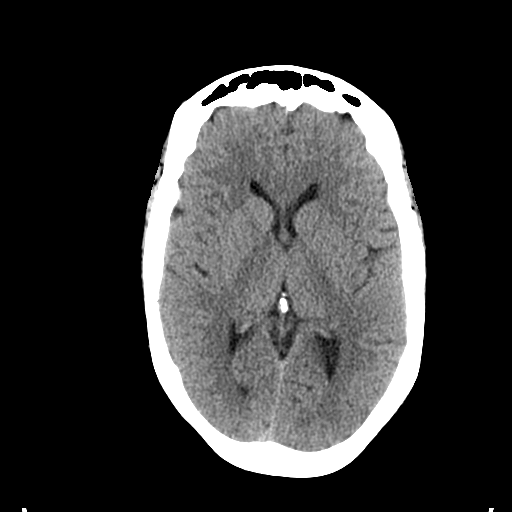
[im 17/33  bone]
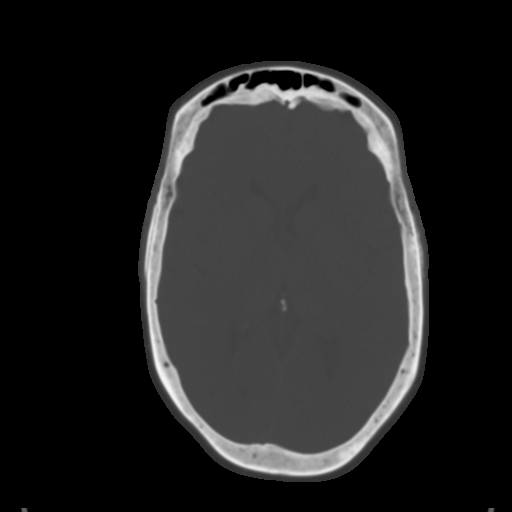
[im 20/33  brain]
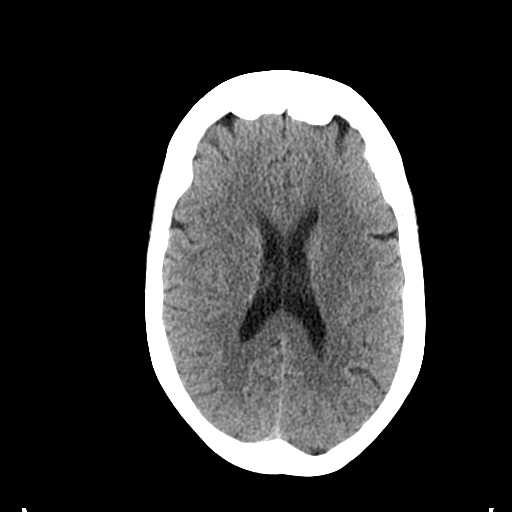
[im 24/33  brain]
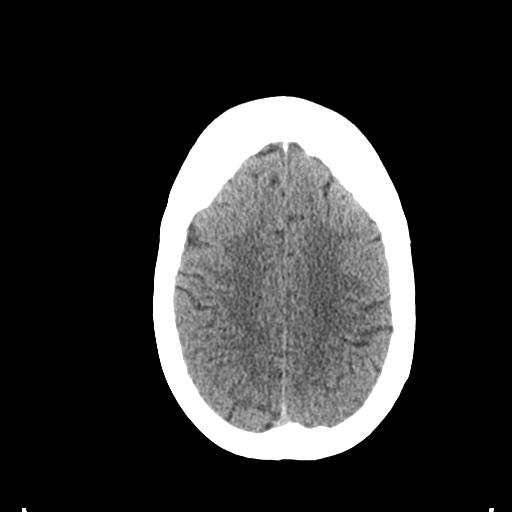
[im 27/33  brain]
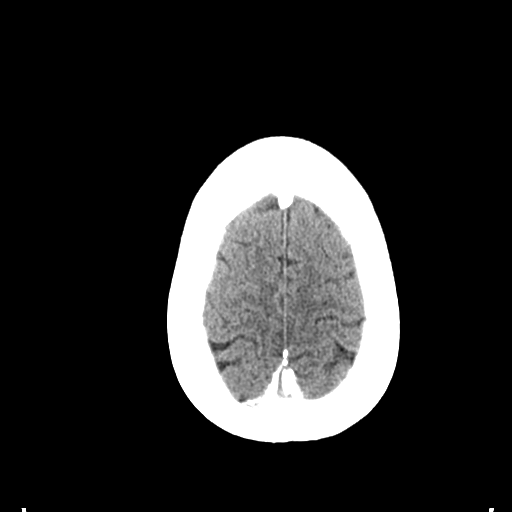
[im 30/33  brain]
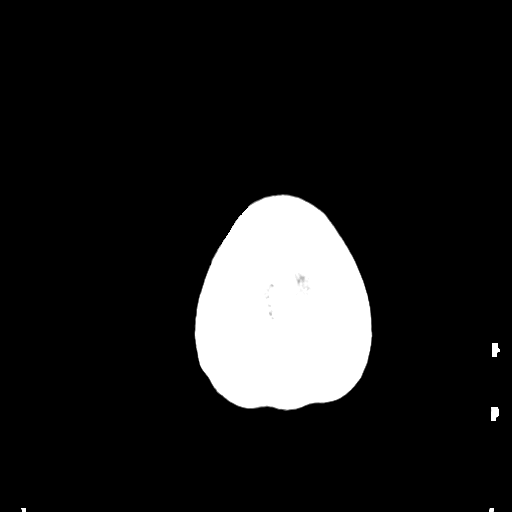
[im 30/33  bone]
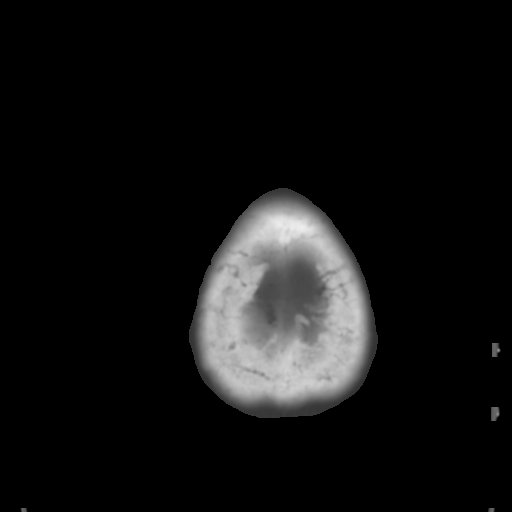

[Series 4: coronal soft tissue · coronal · 0.29mm/px · 3 of 68 slices shown]
[im 23/68  brain]
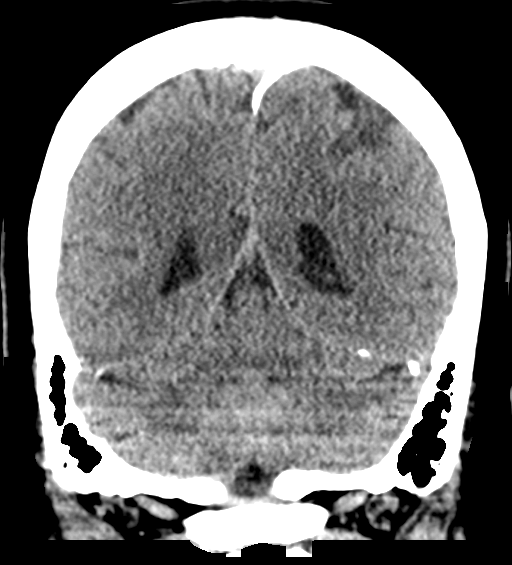
[im 30/68  brain]
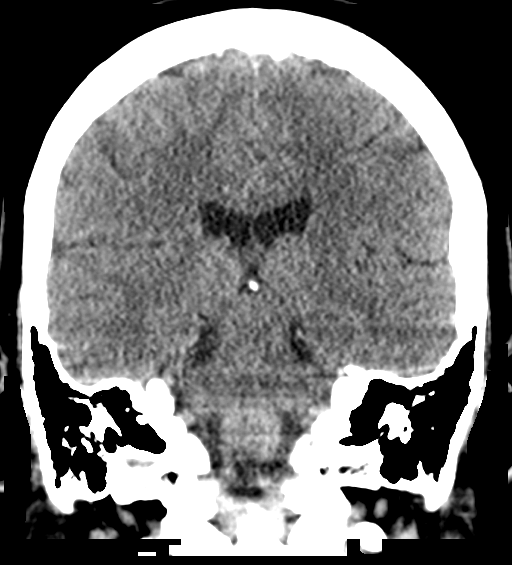
[im 38/68  brain]
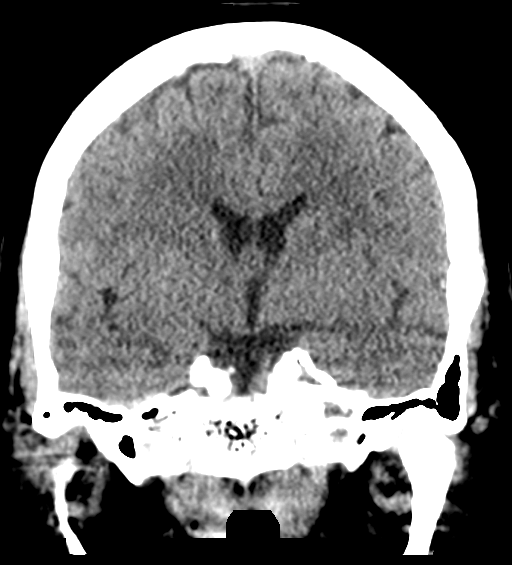

[Series 5: sagittal soft tissue · sagittal · 0.31mm/px · 3 of 47 slices shown]
[im 16/47  brain]
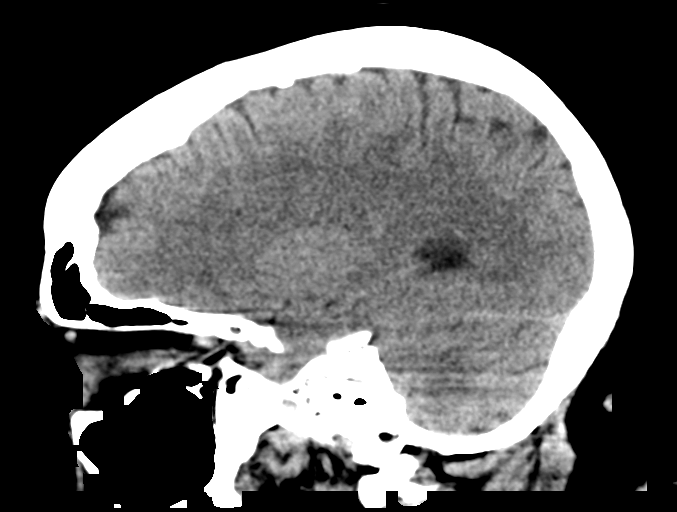
[im 24/47  brain]
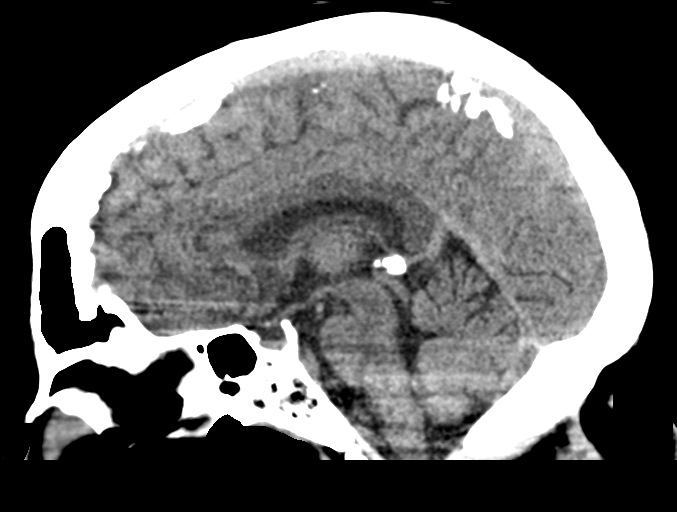
[im 31/47  brain]
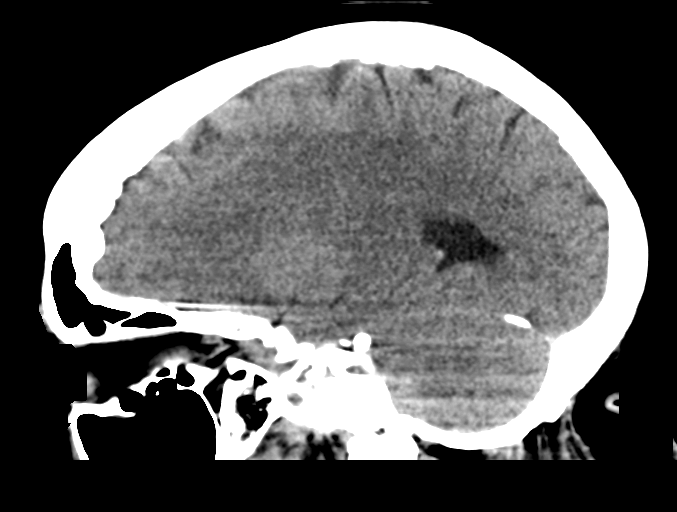

[15 of 47 positions shown; findings below may reference images not displayed]

FINDINGS: Brain: Generalized age appropriate cerebral atrophy. No acute
intracranial hemorrhage. No acute large vessel territory infarct. No
mass lesion, midline shift or mass effect. No hydrocephalus. No
extra-axial fluid collection.

Vascular: No hyperdense vessel.

Skull: Scalp soft tissues within normal limits. Calvarium intact.
Hyperostosis frontalis interna noted.

Sinuses/Orbits: Globes normal soft tissues within normal limits.
Paranasal sinuses are clear. No mastoid effusion.

Other: None.
IMPRESSION: Negative head CT for age. No acute intracranial abnormality
identified.

## 2019-05-13 DIAGNOSIS — I10 Essential (primary) hypertension: Secondary | ICD-10-CM | POA: Diagnosis not present

## 2019-05-13 DIAGNOSIS — E7800 Pure hypercholesterolemia, unspecified: Secondary | ICD-10-CM | POA: Diagnosis not present

## 2019-05-13 DIAGNOSIS — J9601 Acute respiratory failure with hypoxia: Secondary | ICD-10-CM | POA: Diagnosis not present

## 2019-05-13 DIAGNOSIS — U071 COVID-19: Secondary | ICD-10-CM | POA: Diagnosis not present

## 2019-05-13 DIAGNOSIS — F419 Anxiety disorder, unspecified: Secondary | ICD-10-CM | POA: Diagnosis not present

## 2019-05-13 DIAGNOSIS — F329 Major depressive disorder, single episode, unspecified: Secondary | ICD-10-CM | POA: Diagnosis not present

## 2019-05-13 DIAGNOSIS — E119 Type 2 diabetes mellitus without complications: Secondary | ICD-10-CM | POA: Diagnosis not present

## 2019-05-13 DIAGNOSIS — J1281 Pneumonia due to SARS-associated coronavirus: Secondary | ICD-10-CM | POA: Diagnosis not present

## 2019-05-13 DIAGNOSIS — I480 Paroxysmal atrial fibrillation: Secondary | ICD-10-CM | POA: Diagnosis not present

## 2019-05-14 DIAGNOSIS — J1281 Pneumonia due to SARS-associated coronavirus: Secondary | ICD-10-CM | POA: Diagnosis not present

## 2019-05-14 DIAGNOSIS — E7800 Pure hypercholesterolemia, unspecified: Secondary | ICD-10-CM | POA: Diagnosis not present

## 2019-05-14 DIAGNOSIS — F419 Anxiety disorder, unspecified: Secondary | ICD-10-CM | POA: Diagnosis not present

## 2019-05-14 DIAGNOSIS — U071 COVID-19: Secondary | ICD-10-CM | POA: Diagnosis not present

## 2019-05-14 DIAGNOSIS — E119 Type 2 diabetes mellitus without complications: Secondary | ICD-10-CM | POA: Diagnosis not present

## 2019-05-14 DIAGNOSIS — J9601 Acute respiratory failure with hypoxia: Secondary | ICD-10-CM | POA: Diagnosis not present

## 2019-05-14 DIAGNOSIS — I10 Essential (primary) hypertension: Secondary | ICD-10-CM | POA: Diagnosis not present

## 2019-05-14 DIAGNOSIS — I480 Paroxysmal atrial fibrillation: Secondary | ICD-10-CM | POA: Diagnosis not present

## 2019-05-14 DIAGNOSIS — F329 Major depressive disorder, single episode, unspecified: Secondary | ICD-10-CM | POA: Diagnosis not present

## 2019-05-15 DIAGNOSIS — J9601 Acute respiratory failure with hypoxia: Secondary | ICD-10-CM | POA: Diagnosis not present

## 2019-05-15 DIAGNOSIS — F419 Anxiety disorder, unspecified: Secondary | ICD-10-CM | POA: Diagnosis not present

## 2019-05-15 DIAGNOSIS — J1281 Pneumonia due to SARS-associated coronavirus: Secondary | ICD-10-CM | POA: Diagnosis not present

## 2019-05-15 DIAGNOSIS — E7800 Pure hypercholesterolemia, unspecified: Secondary | ICD-10-CM | POA: Diagnosis not present

## 2019-05-15 DIAGNOSIS — E119 Type 2 diabetes mellitus without complications: Secondary | ICD-10-CM | POA: Diagnosis not present

## 2019-05-15 DIAGNOSIS — I480 Paroxysmal atrial fibrillation: Secondary | ICD-10-CM | POA: Diagnosis not present

## 2019-05-15 DIAGNOSIS — F329 Major depressive disorder, single episode, unspecified: Secondary | ICD-10-CM | POA: Diagnosis not present

## 2019-05-15 DIAGNOSIS — U071 COVID-19: Secondary | ICD-10-CM | POA: Diagnosis not present

## 2019-05-15 DIAGNOSIS — I10 Essential (primary) hypertension: Secondary | ICD-10-CM | POA: Diagnosis not present

## 2019-05-27 ENCOUNTER — Ambulatory Visit (INDEPENDENT_AMBULATORY_CARE_PROVIDER_SITE_OTHER): Payer: Medicare HMO | Admitting: Cardiology

## 2019-05-27 ENCOUNTER — Ambulatory Visit (INDEPENDENT_AMBULATORY_CARE_PROVIDER_SITE_OTHER): Payer: Medicare HMO

## 2019-05-27 ENCOUNTER — Other Ambulatory Visit: Payer: Medicare HMO

## 2019-05-27 ENCOUNTER — Other Ambulatory Visit: Payer: Self-pay

## 2019-05-27 ENCOUNTER — Encounter: Payer: Self-pay | Admitting: Cardiology

## 2019-05-27 VITALS — BP 127/86 | HR 81 | Temp 97.6°F | Ht 68.0 in | Wt 318.0 lb

## 2019-05-27 DIAGNOSIS — I10 Essential (primary) hypertension: Secondary | ICD-10-CM | POA: Diagnosis not present

## 2019-05-27 DIAGNOSIS — I4819 Other persistent atrial fibrillation: Secondary | ICD-10-CM

## 2019-05-27 DIAGNOSIS — I480 Paroxysmal atrial fibrillation: Secondary | ICD-10-CM | POA: Diagnosis not present

## 2019-05-27 MED ORDER — METOPROLOL TARTRATE 25 MG PO TABS: 25.0000 mg | ORAL_TABLET | Freq: Two times a day (BID) | ORAL | 2 refills | Status: DC

## 2019-05-27 MED ORDER — APIXABAN 5 MG PO TABS: 5.0000 mg | ORAL_TABLET | Freq: Two times a day (BID) | ORAL | 2 refills | Status: DC

## 2019-05-27 NOTE — Progress Notes (Signed)
Virtual Visit via Video Note   Subjective:   Kayla Roberts, female    DOB: [birth year], 63 y.o.   MRN: [id number]  Chief complaint:  Atrial fibrillation  63 year old Caucasian female with hypertension, seasonal asthma, morbid obesity, COVID pneumonia in late 02/2019, paroxysmal atrial fibrillation.  Patient was supposed to undergo cardioversion in 04/2019, but was found to be in sinus rhythm. Currently, she denies chest pain, shortness of breath, palpitations, leg edema, orthopnea, PND, TIA/syncope. Stress test results performed earlier today discussed with the patient.   Past Medical History:  Diagnosis Date  . Anxiety   . Asthma   . Depression   . GERD (gastroesophageal reflux disease)   . Hyperlipidemia   . Hypertension   . Morbid obesity ([place])      Past Surgical History:  Procedure Laterality Date  . CERVICAL DISCECTOMY  2002  . COLONOSCOPY WITH PROPOFOL N/A 01/18/2019   Procedure: COLONOSCOPY WITH PROPOFOL;  Surgeon: Clarene Essex, MD;  Location: [facility];  Service: Endoscopy;  Laterality: N/A;  . POLYPECTOMY  01/18/2019   Procedure: POLYPECTOMY;  Surgeon: Clarene Essex, MD;  Location: [facility];  Service: Endoscopy;;     Social History   Socioeconomic History  . Marital status: Widowed    Spouse name: Not on file  . Number of children: Not on file  . Years of education: Not on file  . Highest education level: Not on file  Occupational History  . Not on file  Social Needs  . Financial resource strain: Not on file  . Food insecurity    Worry: Not on file    Inability: Not on file  . Transportation needs    Medical: Not on file    Non-medical: Not on file  Tobacco Use  . Smoking status: Never Smoker  . Smokeless tobacco: Never Used  Substance and Sexual Activity  . Alcohol use: No  . Drug use: No  . Sexual activity: Not on file  Lifestyle  . Physical activity    Days per week: Not on file    Minutes per session: Not on file  . Stress: Not on file   Relationships  . Social Herbalist on phone: Not on file    Gets together: Not on file    Attends religious service: Not on file    Active member of club or organization: Not on file    Attends meetings of clubs or organizations: Not on file    Relationship status: Not on file  . Intimate partner violence    Fear of current or ex partner: Not on file    Emotionally abused: Not on file    Physically abused: Not on file    Forced sexual activity: Not on file  Other Topics Concern  . Not on file  Social History Narrative  . Not on file     Family History  Problem Relation Age of Onset  . Hypertension Mother   . Hyperlipidemia Mother   . Heart disease Father   . Hyperlipidemia Father   . Hypertension Father   . Hypertension Brother   . Heart disease Brother   . Hypertension Daughter   . Diabetes Sister      Current Outpatient Medications on File Prior to Visit  Medication Sig Dispense Refill  . apixaban (ELIQUIS) 5 MG TABS tablet Take 1 tablet (5 mg total) by mouth 2 (two) times daily. 120 tablet 2  . atorvastatin (LIPITOR) 20 MG tablet Take 1 tablet (  20 mg total) by mouth daily. 30 tablet 11  . blood glucose meter kit and supplies Dispense based on patient and insurance preference. Use up to four times daily as directed. (FOR ICD-10 E10.9, E11.9). 1 each 0  . diltiazem (CARDIZEM CD) 180 MG 24 hr capsule Take 1 capsule (180 mg total) by mouth 2 (two) times a day. 60 capsule 1  . ferrous sulfate 325 (65 FE) MG tablet Take 1 tablet (325 mg total) by mouth every other day. 30 tablet 3  . Flaxseed, Linseed, (FLAX SEEDS PO) Take 1 tablet by mouth daily.    Marland Kitchen levalbuterol (XOPENEX HFA) 45 MCG/ACT inhaler Inhale 2 puffs into the lungs every 4 (four) hours as needed for wheezing.    . magnesium hydroxide (MILK OF MAGNESIA) 400 MG/5ML suspension Take 15 mLs by mouth daily as needed for mild constipation.    . metFORMIN (GLUCOPHAGE) 1000 MG tablet Take 1 tablet (1,000 mg  total) by mouth 2 (two) times daily with a meal. 60 tablet 11  . metoprolol tartrate (LOPRESSOR) 25 MG tablet Take 1 tablet (25 mg total) by mouth 2 (two) times daily. 120 tablet 2  . QVAR REDIHALER 40 MCG/ACT inhaler INHALE 2 PUFFS INTO THE LUNGS TWICE DAILY (Patient taking differently: Inhale 2 puffs into the lungs 2 (two) times daily as needed (shortness of breath). ) 10.6 g 5  . spironolactone (ALDACTONE) 50 MG tablet Take 1 tablet (50 mg total) by mouth daily. 30 tablet 2  . vitamin C (VITAMIN C) 500 MG tablet Take 1 tablet (500 mg total) by mouth daily. 7 tablet 0  . zinc sulfate 220 (50 Zn) MG capsule Take 1 capsule (220 mg total) by mouth daily. 7 capsule 0  . PROCTOZONE-HC 2.5 % rectal cream Place 1 application rectally 2 (two) times daily as needed for hemorrhoids.      No current facility-administered medications on file prior to visit.     Cardiovascular studies:  Lexiscan Myoview stress test 05/27/2019: Lexiscan stress test was performed. LVEF 72%.  Small sized, mild intensity, decreased tracer uptake in basal inferolateral myocardium on stress images, most likely due to diaphragmatic attenuation. Low risk study.  EKG 03/28/2019: Afib w/RVR  Echocardiogram 03/31/2019: - Left ventricle: Global longitudinal LV strain is normal at -21.9% The cavity size was normal. There was mild concentric hypertrophy. Systolic function was normal. The estimated ejection fraction was in the range of 60% to 65%. Wall motion was normal; there were no regional wall motion abnormalities. There was an increased relative contribution of atrial contraction to ventricular filling. Doppler parameters are consistent with abnormal left ventricular relaxation (grade 1 diastolic dysfunction). - Aortic valve: Trileaflet; mildly thickened, mildly calcified leaflets. - Mitral valve: Calcified annulus. - Tricuspid valve: There was trivial regurgitation. - Pulmonic valve: There was  trivial regurgitation. - Pulmonary arteries: PA peak pressure: 32 mm Hg (S).  Recent labs: Results for Kayla, Roberts (MRN [id number]) as of 04/18/2019 09:15  Ref. Range 04/08/2019 15:13  Sodium Latest Ref Range: 134 - 144 mmol/L 145 (H)  Potassium Latest Ref Range: 3.5 - 5.2 mmol/L 4.5  Chloride Latest Ref Range: 96 - 106 mmol/L 102  CO2 Latest Ref Range: 20 - 29 mmol/L 22  Glucose Latest Ref Range: 65 - 99 mg/dL 119 (H)  BUN Latest Ref Range: 8 - 27 mg/dL 13  Creatinine Latest Ref Range: 0.57 - 1.00 mg/dL 0.78  Calcium Latest Ref Range: 8.7 - 10.3 mg/dL 9.0  Anion gap Latest Ref Range: 10.0 -  18.0 mmol/L 21.0 (H)  BUN/Creatinine Ratio Latest Ref Range: 12 - 28  17  GFR, Est Non African American Latest Ref Range: >59 mL/min/1.73 81  GFR, Est African American Latest Ref Range: >59 mL/min/1.73 93   Results for SARALYNN, LANGHORST (MRN [id number]) as of 04/18/2019 09:15  Ref. Range 04/08/2019 15:13  WBC Latest Ref Range: 3.4 - 10.8 x10E3/uL 5.1  RBC Latest Ref Range: 3.77 - 5.28 x10E6/uL 3.26 (L)  Hemoglobin Latest Ref Range: 11.1 - 15.9 g/dL 9.2 (L)  HCT Latest Ref Range: 34.0 - 46.6 % 27.4 (L)  MCV Latest Ref Range: 79 - 97 fL 84  MCH Latest Ref Range: 26.6 - 33.0 pg 28.2  MCHC Latest Ref Range: 31.5 - 35.7 g/dL 33.6  RDW Latest Ref Range: 11.7 - 15.4 % 15.1  Platelets Latest Ref Range: 150 - 450 x10E3/uL 286   Results for EMARA, LICHTER (MRN [id number]) as of 04/18/2019 09:15  Ref. Range 04/08/2019 15:13  WBC Latest Ref Range: 3.4 - 10.8 x10E3/uL 5.1  RBC Latest Ref Range: 3.77 - 5.28 x10E6/uL 3.26 (L)  Hemoglobin Latest Ref Range: 11.1 - 15.9 g/dL 9.2 (L)  HCT Latest Ref Range: 34.0 - 46.6 % 27.4 (L)  MCV Latest Ref Range: 79 - 97 fL 84  MCH Latest Ref Range: 26.6 - 33.0 pg 28.2  MCHC Latest Ref Range: 31.5 - 35.7 g/dL 33.6  RDW Latest Ref Range: 11.7 - 15.4 % 15.1  Platelets Latest Ref Range: 150 - 450 x10E3/uL 286   Review of Systems  Constitution: Negative for decreased  appetite, malaise/fatigue, weight gain and weight loss.  HENT: Negative for congestion.   Eyes: Negative for visual disturbance.  Cardiovascular: Positive for palpitations. Negative for chest pain, dyspnea on exertion, leg swelling and syncope.  Respiratory: Negative for cough.   Endocrine: Negative for cold intolerance.  Hematologic/Lymphatic: Does not bruise/bleed easily.  Skin: Negative for itching and rash.  Musculoskeletal: Negative for myalgias.  Gastrointestinal: Negative for abdominal pain, nausea and vomiting.  Genitourinary: Negative for dysuria.  Neurological: Negative for dizziness and weakness.  Psychiatric/Behavioral: The patient is not nervous/anxious.   All other systems reviewed and are negative.        Vitals:   05/27/19 1552  BP: 127/86  Pulse: 81  Temp: 97.6 F (36.4 C)  SpO2: 97%     Objective:    Physical Exam  Constitutional: She is oriented to person, place, and time. She appears well-developed and well-nourished. No distress.  HENT:  Head: Normocephalic and atraumatic.  Eyes: Pupils are equal, round, and reactive to light. Conjunctivae are normal.  Neck: No JVD present.  Cardiovascular: Normal rate, regular rhythm and intact distal pulses.  Pulmonary/Chest: Effort normal and breath sounds normal. She has no wheezes. She has no rales.  Abdominal: Soft. Bowel sounds are normal. There is no rebound.  Musculoskeletal:        General: No edema.  Lymphadenopathy:    She has no cervical adenopathy.  Neurological: She is alert and oriented to person, place, and time. No cranial nerve deficit.  Skin: Skin is warm and dry.  Psychiatric: She has a normal mood and affect.  Nursing note and vitals reviewed.       Assessment & Recommendations:   63 year old Caucasian female with hypertension, seasonal asthma, morbid obesity, COVID pneumonia in late 02/2019, new diagnosis of paroxysmal atrial fibrillation.  Paroxysmal Afib: Currently in sinus  rhythm.  Continue metoprolol 25 mg bid, and diltiazem 360 mg.   CHA2DS2VAsc score  4, annual stroke risk 5% Continue Eliquis 5 mg bid.   Stress test with small area of inferolateral perfusion defect, possibly due to diaphragmatic attenuation. In absence of angina symptoms, recommend continued medical management.  Continue lipitor 20 mg daily. Will refer to sleep study.  Hypertension: Controlled.  Follow up in 6 months.    Nigel Mormon, MD [facility] Pager: [phone] Office: [phone] If no answer Cell [phone]

## 2019-05-28 DIAGNOSIS — I10 Essential (primary) hypertension: Secondary | ICD-10-CM | POA: Diagnosis not present

## 2019-05-28 DIAGNOSIS — J1281 Pneumonia due to SARS-associated coronavirus: Secondary | ICD-10-CM | POA: Diagnosis not present

## 2019-05-28 DIAGNOSIS — J9601 Acute respiratory failure with hypoxia: Secondary | ICD-10-CM | POA: Diagnosis not present

## 2019-05-28 DIAGNOSIS — F329 Major depressive disorder, single episode, unspecified: Secondary | ICD-10-CM | POA: Diagnosis not present

## 2019-05-28 DIAGNOSIS — U071 COVID-19: Secondary | ICD-10-CM | POA: Diagnosis not present

## 2019-05-28 DIAGNOSIS — E7800 Pure hypercholesterolemia, unspecified: Secondary | ICD-10-CM | POA: Diagnosis not present

## 2019-05-28 DIAGNOSIS — E119 Type 2 diabetes mellitus without complications: Secondary | ICD-10-CM | POA: Diagnosis not present

## 2019-05-28 DIAGNOSIS — I480 Paroxysmal atrial fibrillation: Secondary | ICD-10-CM | POA: Diagnosis not present

## 2019-05-28 DIAGNOSIS — F419 Anxiety disorder, unspecified: Secondary | ICD-10-CM | POA: Diagnosis not present

## 2019-05-30 DIAGNOSIS — I10 Essential (primary) hypertension: Secondary | ICD-10-CM | POA: Diagnosis not present

## 2019-05-30 DIAGNOSIS — J1281 Pneumonia due to SARS-associated coronavirus: Secondary | ICD-10-CM | POA: Diagnosis not present

## 2019-05-30 DIAGNOSIS — U071 COVID-19: Secondary | ICD-10-CM | POA: Diagnosis not present

## 2019-05-30 DIAGNOSIS — F329 Major depressive disorder, single episode, unspecified: Secondary | ICD-10-CM | POA: Diagnosis not present

## 2019-05-30 DIAGNOSIS — I480 Paroxysmal atrial fibrillation: Secondary | ICD-10-CM | POA: Diagnosis not present

## 2019-05-30 DIAGNOSIS — E7800 Pure hypercholesterolemia, unspecified: Secondary | ICD-10-CM | POA: Diagnosis not present

## 2019-05-30 DIAGNOSIS — F419 Anxiety disorder, unspecified: Secondary | ICD-10-CM | POA: Diagnosis not present

## 2019-05-30 DIAGNOSIS — J9601 Acute respiratory failure with hypoxia: Secondary | ICD-10-CM | POA: Diagnosis not present

## 2019-05-30 DIAGNOSIS — E119 Type 2 diabetes mellitus without complications: Secondary | ICD-10-CM | POA: Diagnosis not present

## 2019-06-03 DIAGNOSIS — F329 Major depressive disorder, single episode, unspecified: Secondary | ICD-10-CM | POA: Diagnosis not present

## 2019-06-03 DIAGNOSIS — F419 Anxiety disorder, unspecified: Secondary | ICD-10-CM | POA: Diagnosis not present

## 2019-06-03 DIAGNOSIS — E119 Type 2 diabetes mellitus without complications: Secondary | ICD-10-CM | POA: Diagnosis not present

## 2019-06-03 DIAGNOSIS — I480 Paroxysmal atrial fibrillation: Secondary | ICD-10-CM | POA: Diagnosis not present

## 2019-06-03 DIAGNOSIS — I10 Essential (primary) hypertension: Secondary | ICD-10-CM | POA: Diagnosis not present

## 2019-06-03 DIAGNOSIS — U071 COVID-19: Secondary | ICD-10-CM | POA: Diagnosis not present

## 2019-06-03 DIAGNOSIS — J1281 Pneumonia due to SARS-associated coronavirus: Secondary | ICD-10-CM | POA: Diagnosis not present

## 2019-06-03 DIAGNOSIS — J9601 Acute respiratory failure with hypoxia: Secondary | ICD-10-CM | POA: Diagnosis not present

## 2019-06-03 DIAGNOSIS — E7800 Pure hypercholesterolemia, unspecified: Secondary | ICD-10-CM | POA: Diagnosis not present

## 2019-07-08 ENCOUNTER — Other Ambulatory Visit: Payer: Self-pay | Admitting: Internal Medicine

## 2019-07-08 DIAGNOSIS — I4819 Other persistent atrial fibrillation: Secondary | ICD-10-CM

## 2019-07-25 ENCOUNTER — Ambulatory Visit: Payer: Medicare HMO

## 2019-08-09 ENCOUNTER — Other Ambulatory Visit: Payer: Self-pay | Admitting: Internal Medicine

## 2019-09-09 ENCOUNTER — Other Ambulatory Visit: Payer: Self-pay | Admitting: Internal Medicine

## 2019-09-09 DIAGNOSIS — I4819 Other persistent atrial fibrillation: Secondary | ICD-10-CM

## 2019-10-16 ENCOUNTER — Ambulatory Visit: Payer: Medicare HMO

## 2019-10-22 ENCOUNTER — Ambulatory Visit (INDEPENDENT_AMBULATORY_CARE_PROVIDER_SITE_OTHER): Payer: Medicare HMO | Admitting: *Deleted

## 2019-10-22 ENCOUNTER — Other Ambulatory Visit: Payer: Self-pay

## 2019-10-22 DIAGNOSIS — Z23 Encounter for immunization: Secondary | ICD-10-CM | POA: Diagnosis not present

## 2019-12-02 ENCOUNTER — Encounter: Payer: Self-pay | Admitting: Cardiology

## 2019-12-02 ENCOUNTER — Other Ambulatory Visit: Payer: Self-pay

## 2019-12-02 ENCOUNTER — Ambulatory Visit (INDEPENDENT_AMBULATORY_CARE_PROVIDER_SITE_OTHER): Payer: Medicare HMO | Admitting: Cardiology

## 2019-12-02 VITALS — BP 159/80 | HR 72 | Temp 95.5°F | Ht 68.0 in | Wt 313.0 lb

## 2019-12-02 DIAGNOSIS — I10 Essential (primary) hypertension: Secondary | ICD-10-CM

## 2019-12-02 DIAGNOSIS — I480 Paroxysmal atrial fibrillation: Secondary | ICD-10-CM | POA: Diagnosis not present

## 2019-12-02 MED ORDER — SPIRONOLACTONE 50 MG PO TABS: 50.0000 mg | ORAL_TABLET | Freq: Every day | ORAL | 2 refills | Status: DC

## 2019-12-02 NOTE — Progress Notes (Signed)
Subjective:   Kayla Roberts, female    DOB: Jul 21, 1956, 64 y.o.   MRN: [id number]  Chief complaint:  Atrial fibrillation  64 year old Caucasian female with hypertension, seasonal asthma, morbid obesity, COVID pneumonia in late 02/2019, paroxysmal atrial fibrillation.  Patient was supposed to undergo cardioversion in 04/2019, but was found to be in sinus rhythm. Currently, she denies chest pain, shortness of breath, palpitations, leg edema, orthopnea, PND, TIA/syncope.   Past Medical History:  Diagnosis Date  . Anxiety   . Asthma   . Depression   . GERD (gastroesophageal reflux disease)   . Hyperlipidemia   . Hypertension   . Morbid obesity ([place])      Past Surgical History:  Procedure Laterality Date  . CERVICAL DISCECTOMY  2002  . COLONOSCOPY WITH PROPOFOL N/A 01/18/2019   Procedure: COLONOSCOPY WITH PROPOFOL;  Surgeon: Clarene Essex, MD;  Location: [facility];  Service: Endoscopy;  Laterality: N/A;  . POLYPECTOMY  01/18/2019   Procedure: POLYPECTOMY;  Surgeon: Clarene Essex, MD;  Location: [facility];  Service: Endoscopy;;     Social History   Socioeconomic History  . Marital status: Widowed    Spouse name: Not on file  . Number of children: Not on file  . Years of education: Not on file  . Highest education level: Not on file  Occupational History  . Not on file  Tobacco Use  . Smoking status: Never Smoker  . Smokeless tobacco: Never Used  Substance and Sexual Activity  . Alcohol use: No  . Drug use: No  . Sexual activity: Not on file  Other Topics Concern  . Not on file  Social History Narrative  . Not on file   Social Determinants of Health   Financial Resource Strain:   . Difficulty of Paying Living Expenses: Not on file  Food Insecurity:   . Worried About Charity fundraiser in the Last Year: Not on file  . Ran Out of Food in the Last Year: Not on file  Transportation Needs:   . Lack of Transportation (Medical): Not on file  . Lack of  Transportation (Non-Medical): Not on file  Physical Activity:   . Days of Exercise per Week: Not on file  . Minutes of Exercise per Session: Not on file  Stress:   . Feeling of Stress : Not on file  Social Connections:   . Frequency of Communication with Friends and Family: Not on file  . Frequency of Social Gatherings with Friends and Family: Not on file  . Attends Religious Services: Not on file  . Active Member of Clubs or Organizations: Not on file  . Attends Archivist Meetings: Not on file  . Marital Status: Not on file  Intimate Partner Violence:   . Fear of Current or Ex-Partner: Not on file  . Emotionally Abused: Not on file  . Physically Abused: Not on file  . Sexually Abused: Not on file     Family History  Problem Relation Age of Onset  . Hypertension Mother   . Hyperlipidemia Mother   . Heart disease Father   . Hyperlipidemia Father   . Hypertension Father   . Hypertension Brother   . Heart disease Brother   . Hypertension Daughter   . Diabetes Sister      Current Outpatient Medications on File Prior to Visit  Medication Sig Dispense Refill  . apixaban (ELIQUIS) 5 MG TABS tablet Take 1 tablet (5 mg total) by mouth 2 (two)  times daily. 180 tablet 2  . atorvastatin (LIPITOR) 20 MG tablet TAKE 1 TABLET(20 MG) BY MOUTH DAILY 30 tablet 11  . diltiazem (CARDIZEM CD) 180 MG 24 hr capsule TAKE 1 CAPSULE(180 MG) BY MOUTH TWICE DAILY 60 capsule 3  . ferrous sulfate 325 (65 FE) MG tablet Take 1 tablet (325 mg total) by mouth every other day. 30 tablet 3  . Flaxseed, Linseed, (FLAX SEEDS PO) Take 1 tablet by mouth daily.    Marland Kitchen levalbuterol (XOPENEX HFA) 45 MCG/ACT inhaler Inhale 2 puffs into the lungs every 4 (four) hours as needed for wheezing.    . metFORMIN (GLUCOPHAGE) 1000 MG tablet Take 1 tablet (1,000 mg total) by mouth 2 (two) times daily with a meal. 60 tablet 11  . metoprolol tartrate (LOPRESSOR) 25 MG tablet Take 1 tablet (25 mg total) by mouth 2 (two)  times daily. 180 tablet 2  . PROCTOZONE-HC 2.5 % rectal cream Place 1 application rectally 2 (two) times daily as needed for hemorrhoids.     Marland Kitchen QVAR REDIHALER 40 MCG/ACT inhaler INHALE 2 PUFFS INTO THE LUNGS TWICE DAILY (Patient taking differently: Inhale 2 puffs into the lungs as needed (shortness of breath). ) 10.6 g 5  . spironolactone (ALDACTONE) 50 MG tablet Take 1 tablet (50 mg total) by mouth daily. 30 tablet 2  . vitamin C (VITAMIN C) 500 MG tablet Take 1 tablet (500 mg total) by mouth daily. (Patient taking differently: Take 500 mg by mouth as needed. ) 7 tablet 0  . zinc sulfate 220 (50 Zn) MG capsule Take 1 capsule (220 mg total) by mouth daily. 7 capsule 0  . blood glucose meter kit and supplies Dispense based on patient and insurance preference. Use up to four times daily as directed. (FOR ICD-10 E10.9, E11.9). 1 each 0   No current facility-administered medications on file prior to visit.    Cardiovascular studies:  EKG 12/02/2019: Probable sinus rhythm 66 bpm with PAC's and sinus arrhtymia.   Lexiscan Myoview stress test 05/27/2019: Lexiscan stress test was performed. LVEF 72%.  Small sized, mild intensity, decreased tracer uptake in basal inferolateral myocardium on stress images, most likely due to diaphragmatic attenuation. Low risk study.  EKG 03/28/2019: Afib w/RVR  Echocardiogram 03/31/2019: - Left ventricle: Global longitudinal LV strain is normal at -21.9% The cavity size was normal. There was mild concentric hypertrophy. Systolic function was normal. The estimated ejection fraction was in the range of 60% to 65%. Wall motion was normal; there were no regional wall motion abnormalities. There was an increased relative contribution of atrial contraction to ventricular filling. Doppler parameters are consistent with abnormal left ventricular relaxation (grade 1 diastolic dysfunction). - Aortic valve: Trileaflet; mildly thickened, mildly calcified  leaflets. - Mitral valve: Calcified annulus. - Tricuspid valve: There was trivial regurgitation. - Pulmonic valve: There was trivial regurgitation. - Pulmonary arteries: PA peak pressure: 32 mm Hg (S).  Recent labs: Results for SHELBIE, FRANKEN (MRN [id number]) as of 04/18/2019 09:15  Ref. Range 04/08/2019 15:13  Sodium Latest Ref Range: 134 - 144 mmol/L 145 (H)  Potassium Latest Ref Range: 3.5 - 5.2 mmol/L 4.5  Chloride Latest Ref Range: 96 - 106 mmol/L 102  CO2 Latest Ref Range: 20 - 29 mmol/L 22  Glucose Latest Ref Range: 65 - 99 mg/dL 119 (H)  BUN Latest Ref Range: 8 - 27 mg/dL 13  Creatinine Latest Ref Range: 0.57 - 1.00 mg/dL 0.78  Calcium Latest Ref Range: 8.7 - 10.3 mg/dL 9.0  Anion  gap Latest Ref Range: 10.0 - 18.0 mmol/L 21.0 (H)  BUN/Creatinine Ratio Latest Ref Range: 12 - 28  17  GFR, Est Non African American Latest Ref Range: >59 mL/min/1.73 81  GFR, Est African American Latest Ref Range: >59 mL/min/1.73 93   Results for JACKIE, RUSSMAN (MRN [id number]) as of 04/18/2019 09:15  Ref. Range 04/08/2019 15:13  WBC Latest Ref Range: 3.4 - 10.8 x10E3/uL 5.1  RBC Latest Ref Range: 3.77 - 5.28 x10E6/uL 3.26 (L)  Hemoglobin Latest Ref Range: 11.1 - 15.9 g/dL 9.2 (L)  HCT Latest Ref Range: 34.0 - 46.6 % 27.4 (L)  MCV Latest Ref Range: 79 - 97 fL 84  MCH Latest Ref Range: 26.6 - 33.0 pg 28.2  MCHC Latest Ref Range: 31.5 - 35.7 g/dL 33.6  RDW Latest Ref Range: 11.7 - 15.4 % 15.1  Platelets Latest Ref Range: 150 - 450 x10E3/uL 286   Results for KELDA, AZAD (MRN [id number]) as of 04/18/2019 09:15  Ref. Range 04/08/2019 15:13  WBC Latest Ref Range: 3.4 - 10.8 x10E3/uL 5.1  RBC Latest Ref Range: 3.77 - 5.28 x10E6/uL 3.26 (L)  Hemoglobin Latest Ref Range: 11.1 - 15.9 g/dL 9.2 (L)  HCT Latest Ref Range: 34.0 - 46.6 % 27.4 (L)  MCV Latest Ref Range: 79 - 97 fL 84  MCH Latest Ref Range: 26.6 - 33.0 pg 28.2  MCHC Latest Ref Range: 31.5 - 35.7 g/dL 33.6  RDW Latest Ref Range: 11.7 -  15.4 % 15.1  Platelets Latest Ref Range: 150 - 450 x10E3/uL 286   Review of Systems  Constitution: Negative for decreased appetite, malaise/fatigue, weight gain and weight loss.  HENT: Negative for congestion.   Eyes: Negative for visual disturbance.  Cardiovascular: Positive for palpitations. Negative for chest pain, dyspnea on exertion, leg swelling and syncope.  Respiratory: Negative for cough.   Endocrine: Negative for cold intolerance.  Hematologic/Lymphatic: Does not bruise/bleed easily.  Skin: Negative for itching and rash.  Musculoskeletal: Negative for myalgias.  Gastrointestinal: Negative for abdominal pain, nausea and vomiting.  Genitourinary: Negative for dysuria.  Neurological: Negative for dizziness and weakness.  Psychiatric/Behavioral: The patient is not nervous/anxious.   All other systems reviewed and are negative.        Vitals:   12/02/19 1443  BP: (!) 159/80  Pulse: 72  Temp: (!) 95.5 F (35.3 C)  SpO2: 95%     Objective:    Physical Exam  Constitutional: She is oriented to person, place, and time. She appears well-developed and well-nourished. No distress.  HENT:  Head: Normocephalic and atraumatic.  Eyes: Pupils are equal, round, and reactive to light. Conjunctivae are normal.  Neck: No JVD present.  Cardiovascular: Normal rate, regular rhythm and intact distal pulses.  Pulmonary/Chest: Effort normal and breath sounds normal. She has no wheezes. She has no rales.  Abdominal: Soft. Bowel sounds are normal. There is no rebound.  Musculoskeletal:        General: No edema.  Lymphadenopathy:    She has no cervical adenopathy.  Neurological: She is alert and oriented to person, place, and time. No cranial nerve deficit.  Skin: Skin is warm and dry.  Psychiatric: She has a normal mood and affect.  Nursing note and vitals reviewed.       Assessment & Recommendations:   64 year old Caucasian female with hypertension, seasonal asthma, morbid  obesity, COVID pneumonia in late 02/2019, new diagnosis of paroxysmal atrial fibrillation.  Paroxysmal Afib: Multiple EKG's performed today given wandering baseline. She is probably  in sinus rhythm with PAC's and sinus arrhythmia, although it is difficult to ascertain due to baseline wander. Nonetheless, her rate is optimal and she is asymptomatic.  Continue metoprolol 25 mg bid, and diltiazem 180 mg.   CHA2DS2VAsc score 4, annual stroke risk 5% Continue Eliquis 5 mg bid.   Stress test with small area of inferolateral perfusion defect, possibly due to diaphragmatic attenuation. In absence of angina symptoms, recommend continued medical management.  Continue lipitor 20 mg daily. Will refer to sleep study.  Hypertension: Elevated today. Refilled spironolactone. Check BMP in 1 week. Continue f/u e/PCP. Encouraged regular exercise and weight loss.  Follow up in 6 months.   Total time spent with patient was 35 minutes and greater than 50% of that time was spent in counseling and coordination care with the patient regarding complex decision making and discussion as state above.    Nigel Mormon, MD [facility] Pager: [phone] Office: [phone] If no answer Cell [phone]

## 2019-12-06 ENCOUNTER — Other Ambulatory Visit (HOSPITAL_COMMUNITY): Payer: Self-pay

## 2019-12-11 ENCOUNTER — Ambulatory Visit (INDEPENDENT_AMBULATORY_CARE_PROVIDER_SITE_OTHER): Payer: Medicare HMO | Admitting: Internal Medicine

## 2019-12-11 ENCOUNTER — Other Ambulatory Visit: Payer: Self-pay

## 2019-12-11 ENCOUNTER — Ambulatory Visit: Payer: Medicare HMO | Admitting: Dietician

## 2019-12-11 ENCOUNTER — Encounter: Payer: Self-pay | Admitting: Internal Medicine

## 2019-12-11 VITALS — BP 124/64 | HR 72 | Temp 97.8°F | Ht 68.0 in | Wt 316.9 lb

## 2019-12-11 DIAGNOSIS — I10 Essential (primary) hypertension: Secondary | ICD-10-CM

## 2019-12-11 DIAGNOSIS — Z8616 Personal history of COVID-19: Secondary | ICD-10-CM

## 2019-12-11 DIAGNOSIS — Z9114 Patient's other noncompliance with medication regimen: Secondary | ICD-10-CM | POA: Diagnosis not present

## 2019-12-11 DIAGNOSIS — E119 Type 2 diabetes mellitus without complications: Secondary | ICD-10-CM

## 2019-12-11 DIAGNOSIS — Z8709 Personal history of other diseases of the respiratory system: Secondary | ICD-10-CM

## 2019-12-11 DIAGNOSIS — Z79899 Other long term (current) drug therapy: Secondary | ICD-10-CM

## 2019-12-11 DIAGNOSIS — Z8701 Personal history of pneumonia (recurrent): Secondary | ICD-10-CM

## 2019-12-11 DIAGNOSIS — Z7984 Long term (current) use of oral hypoglycemic drugs: Secondary | ICD-10-CM | POA: Diagnosis not present

## 2019-12-11 DIAGNOSIS — I480 Paroxysmal atrial fibrillation: Secondary | ICD-10-CM

## 2019-12-11 DIAGNOSIS — J45998 Other asthma: Secondary | ICD-10-CM

## 2019-12-11 LAB — POCT GLYCOSYLATED HEMOGLOBIN (HGB A1C): Hemoglobin A1C: 5.8 % — AB (ref 4.0–5.6)

## 2019-12-11 LAB — GLUCOSE, CAPILLARY: Glucose-Capillary: 96 mg/dL (ref 70–99)

## 2019-12-11 MED ORDER — METFORMIN HCL ER 500 MG PO TB24: 500.0000 mg | ORAL_TABLET | Freq: Every day | ORAL | 1 refills | Status: DC

## 2019-12-11 NOTE — Progress Notes (Signed)
Retinal images were attempted today, however we were unable to obtain adequate images. Kayla Roberts agrees to an ophthalmology referral.

## 2019-12-11 NOTE — Patient Instructions (Addendum)
Ms. Mcconaughey,  It was a pleasure to see you. I am glad you are doing well!  For your diabetes, I have changed your metformin to the extended release version to help reduce the GI side effects. I have also decreased the dose to 500 mg. Please take this once a day with breakfast.  Continue to take your other medications as prescribed. If there is anything abnormal on your blood work, I will give you a call.  Please follow up with me again in 3 months. If you have any questions or concerns, call our clinic at [phone] or after hours call [phone] and ask for the internal medicine resident on call. Thank you!  Dr. Philipp Ovens

## 2019-12-11 NOTE — Progress Notes (Signed)
58

## 2019-12-12 ENCOUNTER — Encounter: Payer: Self-pay | Admitting: Internal Medicine

## 2019-12-12 LAB — BMP8+ANION GAP
Anion Gap: 16 mmol/L (ref 10.0–18.0)
BUN/Creatinine Ratio: 12 (ref 12–28)
BUN: 11 mg/dL (ref 8–27)
CO2: 20 mmol/L (ref 20–29)
Calcium: 9.9 mg/dL (ref 8.7–10.3)
Chloride: 105 mmol/L (ref 96–106)
Creatinine, Ser: 0.94 mg/dL (ref 0.57–1.00)
GFR calc Af Amer: 74 mL/min/{1.73_m2} (ref >59)
GFR calc non Af Amer: 64 mL/min/{1.73_m2} (ref >59)
Glucose: 86 mg/dL (ref 65–99)
Potassium: 5 mmol/L (ref 3.5–5.2)
Sodium: 141 mmol/L (ref 134–144)

## 2019-12-12 LAB — MICROALBUMIN / CREATININE URINE RATIO
Creatinine, Urine: 76 mg/dL
Microalb/Creat Ratio: 6 mg/g creat (ref 0–29)
Microalbumin, Urine: 4.2 ug/mL

## 2019-12-12 NOTE — Assessment & Plan Note (Addendum)
Well controlled, 124/64. Continue metoprolol, spironolactone, and diltiazem per cardiology. Repeating BMP today.   ADDENDUM: BMP within normal limits. Potassium 5.0.

## 2019-12-12 NOTE — Progress Notes (Signed)
Subjective:   Patient ID: Kayla Roberts female   DOB: Sep 05, 1956 64 y.o.   MRN: [id number]  HPI: Kayla Roberts is a 64 y.o. female with past medical history outlined below here for HTN and DM follow up. For the details of today's visit, please refer to the assessment and plan.   Past Medical History:  Diagnosis Date  . Anxiety   . Asthma   . Depression   . GERD (gastroesophageal reflux disease)   . Hyperlipidemia   . Hypertension   . Morbid obesity ([place])    Current Outpatient Medications  Medication Sig Dispense Refill  . apixaban (ELIQUIS) 5 MG TABS tablet Take 1 tablet (5 mg total) by mouth 2 (two) times daily. 180 tablet 2  . atorvastatin (LIPITOR) 20 MG tablet TAKE 1 TABLET(20 MG) BY MOUTH DAILY 30 tablet 11  . blood glucose meter kit and supplies Dispense based on patient and insurance preference. Use up to four times daily as directed. (FOR ICD-10 E10.9, E11.9). 1 each 0  . diltiazem (CARDIZEM CD) 180 MG 24 hr capsule TAKE 1 CAPSULE(180 MG) BY MOUTH TWICE DAILY 60 capsule 3  . ferrous sulfate 325 (65 FE) MG tablet Take 1 tablet (325 mg total) by mouth every other day. 30 tablet 3  . Flaxseed, Linseed, (FLAX SEEDS PO) Take 1 tablet by mouth daily.    Marland Kitchen levalbuterol (XOPENEX HFA) 45 MCG/ACT inhaler Inhale 2 puffs into the lungs every 4 (four) hours as needed for wheezing.    . metFORMIN (GLUCOPHAGE-XR) 500 MG 24 hr tablet Take 1 tablet (500 mg total) by mouth daily with breakfast. 90 tablet 1  . metoprolol tartrate (LOPRESSOR) 25 MG tablet Take 1 tablet (25 mg total) by mouth 2 (two) times daily. 180 tablet 2  . PROCTOZONE-HC 2.5 % rectal cream Place 1 application rectally 2 (two) times daily as needed for hemorrhoids.     Marland Kitchen QVAR REDIHALER 40 MCG/ACT inhaler INHALE 2 PUFFS INTO THE LUNGS TWICE DAILY (Patient taking differently: Inhale 2 puffs into the lungs as needed (shortness of breath). ) 10.6 g 5  . spironolactone (ALDACTONE) 50 MG tablet Take 1 tablet (50 mg total) by  mouth daily. 90 tablet 2  . vitamin C (VITAMIN C) 500 MG tablet Take 1 tablet (500 mg total) by mouth daily. (Patient taking differently: Take 500 mg by mouth as needed. ) 7 tablet 0  . zinc sulfate 220 (50 Zn) MG capsule Take 1 capsule (220 mg total) by mouth daily. 7 capsule 0   No current facility-administered medications for this visit.   Family History  Problem Relation Age of Onset  . Hypertension Mother   . Hyperlipidemia Mother   . Heart disease Father        unknown to pt/was in [place]  . Hyperlipidemia Father   . Hypertension Father   . Hypertension Brother   . Heart disease Brother   . Hypertension Daughter   . Diabetes Sister    Social History   Socioeconomic History  . Marital status: Widowed    Spouse name: Not on file  . Number of children: 5  . Years of education: Not on file  . Highest education level: Not on file  Occupational History  . Not on file  Tobacco Use  . Smoking status: Never Smoker  . Smokeless tobacco: Never Used  Substance and Sexual Activity  . Alcohol use: No  . Drug use: No  . Sexual activity: Not on file  Other Topics Concern  .  Not on file  Social History Narrative   5 children 1 deceased   Social Determinants of Health   Financial Resource Strain:   . Difficulty of Paying Living Expenses: Not on file  Food Insecurity:   . Worried About Charity fundraiser in the Last Year: Not on file  . Ran Out of Food in the Last Year: Not on file  Transportation Needs:   . Lack of Transportation (Medical): Not on file  . Lack of Transportation (Non-Medical): Not on file  Physical Activity:   . Days of Exercise per Week: Not on file  . Minutes of Exercise per Session: Not on file  Stress:   . Feeling of Stress : Not on file  Social Connections:   . Frequency of Communication with Friends and Family: Not on file  . Frequency of Social Gatherings with Friends and Family: Not on file  . Attends Religious Services: Not on file  . Active  Member of Clubs or Organizations: Not on file  . Attends Archivist Meetings: Not on file  . Marital Status: Not on file    Review of Systems: Review of Systems  Respiratory: Negative for cough and shortness of breath.   Cardiovascular: Negative for chest pain.     Objective:  Physical Exam:  Vitals:   12/11/19 1042  BP: 124/64  Pulse: 72  Temp: 97.8 F (36.6 C)  TempSrc: Oral  SpO2: 100%  Weight: (!) 316 lb 14.4 oz (143.7 kg)  Height: '5\' 8"'  (1.727 m)    Physical Exam  Constitutional: She is oriented to person, place, and time and well-developed, well-nourished, and in no distress. No distress.  Cardiovascular: Normal rate, regular rhythm and normal heart sounds.  No murmur heard. Distal pulses intact  Pulmonary/Chest: Effort normal and breath sounds normal. No respiratory distress.  Neurological: She is alert and oriented to person, place, and time.  Skin: She is not diaphoretic.     Assessment & Plan:   See Encounters Tab for problem based charting.

## 2019-12-12 NOTE — Assessment & Plan Note (Addendum)
Well controlled, hgb A1c today is 5.8. She has been taking her metformin inconsistently due to GI side effects. Will switch to an XL formulation today to help with this and decrease to just 500 mg once daily.  -- Change metformin to 500 mg XL qd  -- Foot exam done today  ADDENDUM: No microalbuminuria, repeat 1 year.

## 2019-12-12 NOTE — Assessment & Plan Note (Signed)
Patient has a history of asthma and was hospitalized for COVID pneumonia in April of this year. Since that hospitalization, patient reports complete remission of her asthma symptoms. She has not required her inhalers in months. Will continue to monitor off therapy for now.

## 2019-12-12 NOTE — Addendum Note (Signed)
Addended by: Jodean Lima on: 12/12/2019 02:16 PM   Modules accepted: Orders

## 2019-12-12 NOTE — Assessment & Plan Note (Signed)
Currently in sinus, managed my cardiology. Continue metoprolol 25 mg BID and diltiazem 180 mg daily.

## 2019-12-20 ENCOUNTER — Ambulatory Visit: Payer: Medicare HMO | Admitting: Cardiology

## 2020-01-04 ENCOUNTER — Other Ambulatory Visit: Payer: Self-pay | Admitting: Internal Medicine

## 2020-01-04 DIAGNOSIS — I4819 Other persistent atrial fibrillation: Secondary | ICD-10-CM

## 2020-01-06 NOTE — Telephone Encounter (Signed)
diltiazem (CARDIZEM CD) 180 MG 24 hr capsule, REFILL REQUEST @  [facility] [id number] - [address], [place] AT [phone] (Phone) [phone] (Fax)

## 2020-01-10 ENCOUNTER — Ambulatory Visit (HOSPITAL_COMMUNITY): Admit: 2020-01-10 | Payer: Medicare HMO | Admitting: Cardiology

## 2020-01-10 ENCOUNTER — Encounter (HOSPITAL_COMMUNITY): Payer: Self-pay

## 2020-01-10 SURGERY — CARDIOVERSION
Anesthesia: General

## 2020-02-28 ENCOUNTER — Other Ambulatory Visit: Payer: Self-pay | Admitting: Cardiology

## 2020-02-28 DIAGNOSIS — I4819 Other persistent atrial fibrillation: Secondary | ICD-10-CM

## 2020-03-04 ENCOUNTER — Other Ambulatory Visit: Payer: Self-pay | Admitting: *Deleted

## 2020-03-04 DIAGNOSIS — E119 Type 2 diabetes mellitus without complications: Secondary | ICD-10-CM

## 2020-03-04 MED ORDER — ACCU-CHEK FASTCLIX LANCET KIT: PACK | 0 refills | Status: AC

## 2020-03-04 MED ORDER — ACCU-CHEK NANO SMARTVIEW W/DEVICE KIT: PACK | 0 refills | Status: AC

## 2020-03-04 MED ORDER — METFORMIN HCL ER 500 MG PO TB24: 500.0000 mg | ORAL_TABLET | Freq: Every day | ORAL | 1 refills | Status: DC

## 2020-03-04 MED ORDER — ACCU-CHEK FASTCLIX LANCETS MISC: 1 refills | Status: AC

## 2020-03-10 ENCOUNTER — Other Ambulatory Visit: Payer: Self-pay | Admitting: *Deleted

## 2020-03-18 ENCOUNTER — Other Ambulatory Visit: Payer: Self-pay | Admitting: Internal Medicine

## 2020-03-18 ENCOUNTER — Ambulatory Visit: Payer: Medicare HMO | Admitting: Internal Medicine

## 2020-03-18 ENCOUNTER — Telehealth: Payer: Self-pay | Admitting: *Deleted

## 2020-03-18 DIAGNOSIS — Z1231 Encounter for screening mammogram for malignant neoplasm of breast: Secondary | ICD-10-CM

## 2020-03-18 NOTE — Telephone Encounter (Signed)
Pt did not come her ppt this morning. I called pt - stated she had called to cancel her appt. And she will call back to re-schedule.

## 2020-04-26 ENCOUNTER — Other Ambulatory Visit: Payer: Self-pay | Admitting: Internal Medicine

## 2020-04-26 DIAGNOSIS — E119 Type 2 diabetes mellitus without complications: Secondary | ICD-10-CM

## 2020-06-04 ENCOUNTER — Ambulatory Visit: Payer: Medicare HMO | Admitting: Cardiology

## 2020-06-18 ENCOUNTER — Telehealth: Payer: Self-pay | Admitting: Student

## 2020-06-18 ENCOUNTER — Ambulatory Visit (INDEPENDENT_AMBULATORY_CARE_PROVIDER_SITE_OTHER): Payer: Medicare HMO | Admitting: Student

## 2020-06-18 ENCOUNTER — Encounter: Payer: Self-pay | Admitting: Student

## 2020-06-18 VITALS — BP 154/77 | HR 62 | Temp 98.1°F | Ht 65.0 in | Wt 317.8 lb

## 2020-06-18 DIAGNOSIS — I10 Essential (primary) hypertension: Secondary | ICD-10-CM | POA: Diagnosis not present

## 2020-06-18 DIAGNOSIS — J4550 Severe persistent asthma, uncomplicated: Secondary | ICD-10-CM | POA: Diagnosis not present

## 2020-06-18 DIAGNOSIS — Z1159 Encounter for screening for other viral diseases: Secondary | ICD-10-CM

## 2020-06-18 DIAGNOSIS — I4819 Other persistent atrial fibrillation: Secondary | ICD-10-CM | POA: Diagnosis not present

## 2020-06-18 DIAGNOSIS — E7800 Pure hypercholesterolemia, unspecified: Secondary | ICD-10-CM

## 2020-06-18 DIAGNOSIS — D509 Iron deficiency anemia, unspecified: Secondary | ICD-10-CM | POA: Diagnosis not present

## 2020-06-18 DIAGNOSIS — Z23 Encounter for immunization: Secondary | ICD-10-CM

## 2020-06-18 DIAGNOSIS — E118 Type 2 diabetes mellitus with unspecified complications: Secondary | ICD-10-CM | POA: Diagnosis not present

## 2020-06-18 DIAGNOSIS — Z9289 Personal history of other medical treatment: Secondary | ICD-10-CM | POA: Diagnosis not present

## 2020-06-18 DIAGNOSIS — E119 Type 2 diabetes mellitus without complications: Secondary | ICD-10-CM

## 2020-06-18 DIAGNOSIS — I480 Paroxysmal atrial fibrillation: Secondary | ICD-10-CM

## 2020-06-18 DIAGNOSIS — R5383 Other fatigue: Secondary | ICD-10-CM

## 2020-06-18 DIAGNOSIS — Z0000 Encounter for general adult medical examination without abnormal findings: Secondary | ICD-10-CM

## 2020-06-18 LAB — POCT GLYCOSYLATED HEMOGLOBIN (HGB A1C): Hemoglobin A1C: 5.6 % (ref 4.0–5.6)

## 2020-06-18 LAB — GLUCOSE, CAPILLARY: Glucose-Capillary: 97 mg/dL (ref 70–99)

## 2020-06-18 MED ORDER — DILTIAZEM HCL ER COATED BEADS 180 MG PO CP24: ORAL_CAPSULE | ORAL | 5 refills | Status: DC

## 2020-06-18 MED ORDER — BREO ELLIPTA 200-25 MCG/INH IN AEPB: 1.0000 | INHALATION_SPRAY | Freq: Every day | RESPIRATORY_TRACT | 1 refills | Status: DC

## 2020-06-18 MED ORDER — LEVALBUTEROL TARTRATE 45 MCG/ACT IN AERO: 2.0000 | INHALATION_SPRAY | Freq: Four times a day (QID) | RESPIRATORY_TRACT | 2 refills | Status: AC | PRN

## 2020-06-18 NOTE — Assessment & Plan Note (Signed)
A: Patient presents today complaining of SOB, productive cough, and chest tightness. No decreased breath sounds or wheezing auscultated on examination. She states she had been rationing her Levalbuterol and Qvar inhalers and recently ran out of these medications. She endorses daily day and night exacerbations.  P: Continue Levalbuterol at less frequent dosing (2 puffs q6 hours) PRN for SOB/wheezing. - Discontinued QVar - Prescribed Breo Ellipta to combine ICS/LABA into one medication

## 2020-06-18 NOTE — Progress Notes (Addendum)
   CC: Headache and shortness of breath  HPI:  [name] is a 64 y.o. F w/ Hx of type II DM, paroxysmal Afib, HLD, HTN, anemia, uncontrolled asthma, and COVID-19 infection last year presenting with headache and shortness of breath.  She states her headache began 1 week ago.  Onset was gradual and has waxed and waned since, peaking in the afternoon. She describes the pain as a pressure along her bilateral brow line, and endorses associated photophobia.  She notes she has been drinking more coffee in the last week (up to 2-3 cups per day) and is trying to cut down again.  She denies any trauma, recent stressors, sick contacts, phonophobia, nausea, vomiting, fevers, or chills.  She also says she has had worsening of her asthma over the last 1.5 years, and currently complains of SOB, productive cough, and chest tightness.  She notes she has run out of her Levalbuterol and QVar inhalers recently. She previously used them regularly but has rationed recently to reserve her supplies.  She says she has been experiencing symptoms both daily and nightly.  She states she has never taken a LABA.  She endorses fluid retention, saying she did not have this issue until her HCTZ was switched to spironolactone. She denies any CP.  Allergies: NKDA  Past Medical History:  Diagnosis Date  . Anxiety   . Asthma   . Depression   . GERD (gastroesophageal reflux disease)   . Hyperlipidemia   . Hypertension   . Morbid obesity ([place])    PSHx: Patient just moved. She has never smoked, formerly but no longer drinks alcohol, and denies any other drug use.   PFHx: Patient's mother had asthma.  Review of Systems:  All others negative except as noted above in HPI.   Vitals:   06/18/20 1045  BP: (!) 154/77  Pulse: 62  Temp: 98.1 F (36.7 C)  SpO2: 98%  Weight: (!) 317 lb 12.8 oz (144.2 kg)  Height: 5\' 5"  (1.651 m)   Physical Exam: Constitutional: Patient appears well. No acute distress. She is morbidly  obese. Eyes: No conjunctival injection. Sclera non-icteric.  HENT: Moist mucus membranes. No oral lesions. Respiratory: Lungs are clear to auscultation, bilaterally. No wheezes, rales, or rhonchi.  Cardiovascular: Rate is normal, rhythm is irregularly irregular. No murmurs, rubs, or gallops. There is minimal bilateral LE pitting edema. Distal pulses 2+ in all four extremities.  Skin: No lesions notes. No jaundice. Psychiatric: Appropriate range of affect. Linear thought process.  Assessment & Plan:   See Encounters Tab for problem based charting.  Addendum 06/19/20: Spoke with patient over the phone, explaining that her HgbA1c is WNL (5.6) on Metformin 500mg  once daily. She is tolerating metformin well without diarrhea, so will continue this dose for now. Her lipids remain elevated with total cholesterol 217, LDL 120. Discussed that we will increase Atorvastatin from 20mg  to 80mg  daily. Hgb 11.1 with normal iron studies and ferritin. No need to start iron supplementation at this time.   She was able to pick up her Levalbuterol and Breo Ellipta inhalers and feels comfortable with their use. Patient has no questions or concerns at this time.  Patient seen with Dr. Dareen Piano.  Jeralyn Bennett, PGY1 [facility]  Pager: [phone]

## 2020-06-18 NOTE — Assessment & Plan Note (Signed)
A: Last HgbA1c 5.8 on 12/11/19 consistent with pre-diabetes. Patient started lower dose Metformin XR 500mg /day secondary to GI side effects. Foot exam today WNL without neuropathy. Patient requires ophthalmology exam.   P: Repeat HgbA1c and capillary glucose. Repeat BMP.

## 2020-06-18 NOTE — Telephone Encounter (Signed)
Spoke with patient and informed her that I have also refilled her Levalbuterol 2 puffs q6 hours PRN for SOB and wheezing in addition to [employer] once daily. Patient has no questions at this time.  Jeralyn Bennett, PGY1 Internal Medicine Pager: [phone]

## 2020-06-18 NOTE — Assessment & Plan Note (Signed)
A: Patient has never been screened for hepatitis C. She is overdue to mammography, ophthalmology exam, and PNA PPSV23 shot.   P: Pneumo polysaccharide 23 shot given this visit. - Referred patient to GI [facility] center for screening mammography - Referred patient to ophthalmology for retinal exam

## 2020-06-18 NOTE — Assessment & Plan Note (Signed)
A: Patient is currently in Afib with regular rate. She has run out of Diltiazem 180mg .   P: Continue Eliquis 5mg  BID and Lopressor 25mg  BID. - Refilled Diltiazem 180mg 

## 2020-06-18 NOTE — Assessment & Plan Note (Signed)
A: Last lipid profile completed in 2017 was WNL. Patient continues to take Atorvastatin 20mg  daily.  P: Will recheck lipid panel today and recommend increasing Atorvastatin to 40mg  or 80mg  daily if elevated.

## 2020-06-18 NOTE — Assessment & Plan Note (Signed)
A: Blood pressure today elevated to 154/77. Patient states she ran out of Diltiazem 180mg  prescription. She also has headache and SOB which may be contributing to HTN.  P: Refilled Diltiazem 180mg  - Continue Lopressor 25mg  BID and spironolactone 50mg .

## 2020-06-18 NOTE — Patient Instructions (Addendum)
Today, we discussed that your headaches may be due to high blood pressure or withdrawal from caffeine. I have refilled your Diltiazem. Please take this regularly and try to cut back on your caffeine consumption very slowly to avoid headaches. Hopefully they will resolve when your blood pressure lowers.  We will also check your glucose levels, kidney function, and cholesterol and blood counts.  I have prescribed you a new inhaler called "Breo Ellipta". Please inhale 1 puff of Breo Ellipta per day instead of Leavlbuterol and QVar. Please note if this improves your breathing.   I have sent in a referral for an eye exam and mammography. You should receive a call to schedule these appointments soon.  It was great to meet you and please don't hesitate to call with questions: [phone].  Please make a follow up appointment in 1 month to assess your headaches, blood pressure and breathing.   Thank you,  Dr. Jeralyn Bennett   How to Use a Dry Powder Inhaler  A dry powder inhaler (DPI) is a device for taking medicine that must be breathed into the lungs (inhaled). You get one dose each time you inhale the medicine. Most adults use an inhaler two times each day. Your doctor will tell you how often to use your inhaler. There are several types of dry powder inhalers. They include:  Disc inhalers. These are shaped like a disc. The medicine is loaded when you slide the lever.  Inhalers that twist. The medicine is loaded when you twist the inhaler. Follow directions for use from your doctor. Read the package instructions that come with your inhaler. What are the risks? If you do not use your inhaler correctly, medicine might not get to your lungs to help you breathe. Inhaler medicine can cause side effects, such as:  Mouth infection.  Throat infection.  Cough.  A voice that sounds rough (hoarseness).  Headache.  A feeling that you are going to throw up (nausea).  Throwing up  (vomiting).  Lung infection (pneumonia). This can happen if you have chronic obstructive lung (pulmonary) disease (COPD). Supplies needed:  An inhaler. The medicine that you will need is already in the inhaler. How to use a dry powder inhaler 1. Remove any gum or candy from your mouth. 2. Stand or sit up straight. 3. Activate the medicine in your inhaler, depending on the type of DPI you have. 4. Turn your head and breathe out. Do not breathe out into the mouthpiece. 5. Turn your head back to the mouthpiece and seal your lips around it. 6. Take a deep breath through your mouth. Do not breathe through your nose. 7. Hold your breath for 10 seconds. 8. Remove the inhaler from your mouth, turn your head, and breathe out slowly. 9. You may not feel the medicine going into your lungs. This is normal. 10. Check the dose number on the inhaler. It should go down when you use it. 11. Rinse your mouth with water. Do not swallow the water. General recommendations  Do not drop or shake your inhaler.  Do not wash your inhaler. If the mouthpiece gets dirty, use a dry cloth to wipe it clean.  Do not breathe into the inhaler.  Store your inhaler in a dry place at room temperature. Do not store it in your bathroom.  Keep track of your doses: ? When the dose number is low, it is time to pick up a new inhaler at your pharmacy. ? When the dose number shows  zero (0), throw away the inhaler. Follow these instructions at home:  Take your inhaled medicine only as told by your doctor.  Keep all follow-up visits as told by your doctor. This is important. Contact a health care provider if:  You have a sore mouth.  You have a sore throat.  You have a lasting (persistent) cough.  Your voice changes.  You have a fever.  You have any of these problems after starting to use your inhaler: ? You get headaches often. ? You often have a feeling that you are going to throw up. ? You throw up  often.  Your asthma or COPD symptoms have not improved after you have been using your inhaler for a few weeks. Get help right away if:  You have very bad shortness of breath.  You have trouble breathing. Summary  A dry powder inhaler (DPI) is a device that has medicine in it that should help you breathe better.  Follow instructions from your doctor about using your inhaler.  Your doctor will prescribe the strength of the medicine and how often you need to inhale it.  Keep your inhaler dry. The medicine in the inhaler must be kept dry.  If you have any questions about how to use your inhaler, talk with your doctor or pharmacist. This information is not intended to replace advice given to you by your health care provider. Make sure you discuss any questions you have with your health care provider. Document Revised: 05/15/2018 Document Reviewed: 08/08/2016 Elsevier Patient Education  [place].

## 2020-06-18 NOTE — Assessment & Plan Note (Signed)
A: Patient has a history of normocytic anemia. She had been on ferrous sulfate supplementation in the past but no longer takes iron supplements.   P: Recheck CBC. - Ordered iron studies and ferritin levels. Will start replacement therapy if indicated.

## 2020-06-19 ENCOUNTER — Telehealth: Payer: Self-pay | Admitting: Student

## 2020-06-19 ENCOUNTER — Ambulatory Visit: Payer: Medicare HMO | Admitting: Cardiology

## 2020-06-19 LAB — BMP8+ANION GAP
Anion Gap: 16 mmol/L (ref 10.0–18.0)
BUN/Creatinine Ratio: 12 (ref 12–28)
BUN: 10 mg/dL (ref 8–27)
CO2: 23 mmol/L (ref 20–29)
Calcium: 9.8 mg/dL (ref 8.7–10.3)
Chloride: 104 mmol/L (ref 96–106)
Creatinine, Ser: 0.85 mg/dL (ref 0.57–1.00)
GFR calc Af Amer: 83 mL/min/{1.73_m2} (ref >59)
GFR calc non Af Amer: 72 mL/min/{1.73_m2} (ref >59)
Glucose: 95 mg/dL (ref 65–99)
Potassium: 4.7 mmol/L (ref 3.5–5.2)
Sodium: 143 mmol/L (ref 134–144)

## 2020-06-19 LAB — CBC
Hematocrit: 33.5 % — ABNORMAL LOW (ref 34.0–46.6)
Hemoglobin: 11.1 g/dL (ref 11.1–15.9)
MCH: 28 pg (ref 26.6–33.0)
MCHC: 33.1 g/dL (ref 31.5–35.7)
MCV: 84 fL (ref 79–97)
Platelets: 283 10*3/uL (ref 150–450)
RBC: 3.97 x10E6/uL (ref 3.77–5.28)
RDW: 14.5 % (ref 11.7–15.4)
WBC: 3.4 10*3/uL (ref 3.4–10.8)

## 2020-06-19 LAB — LIPID PANEL
Chol/HDL Ratio: 2.7 ratio (ref 0.0–4.4)
Cholesterol, Total: 217 mg/dL — ABNORMAL HIGH (ref 100–199)
HDL: 81 mg/dL (ref >39)
LDL Chol Calc (NIH): 120 mg/dL — ABNORMAL HIGH (ref 0–99)
Triglycerides: 92 mg/dL (ref 0–149)
VLDL Cholesterol Cal: 16 mg/dL (ref 5–40)

## 2020-06-19 LAB — IRON AND TIBC
Iron Saturation: 28 % (ref 15–55)
Iron: 103 ug/dL (ref 27–139)
Total Iron Binding Capacity: 370 ug/dL (ref 250–450)
UIBC: 267 ug/dL (ref 118–369)

## 2020-06-19 LAB — FERRITIN: Ferritin: 29 ng/mL (ref 15–150)

## 2020-06-19 LAB — HEPATITIS C ANTIBODY: Hep C Virus Ab: <0.1 s/co ratio (ref 0.0–0.9)

## 2020-06-19 MED ORDER — ATORVASTATIN CALCIUM 80 MG PO TABS: 80.0000 mg | ORAL_TABLET | Freq: Every day | ORAL | 3 refills | Status: AC

## 2020-06-19 MED ORDER — ATORVASTATIN CALCIUM 80 MG PO TABS: 80.0000 mg | ORAL_TABLET | Freq: Every day | ORAL | 3 refills | Status: DC

## 2020-06-19 NOTE — Progress Notes (Signed)
Internal Medicine Clinic Attending  I saw and evaluated the patient.  I personally confirmed the key portions of the history and exam documented by Dr. Speakman and I reviewed pertinent patient test results.  The assessment, diagnosis, and plan were formulated together and I agree with the documentation in the resident's note.  

## 2020-06-19 NOTE — Addendum Note (Signed)
Addended by: Mosetta Anis on: 06/19/2020 02:33 PM   Modules accepted: Orders

## 2020-06-19 NOTE — Telephone Encounter (Signed)
Spoke with patient over the phone, explaining that her HgbA1c is WNL (5.6) on Metformin 500mg  once daily. She is tolerating metformin well without diarrhea, so will continue this dose for now. Her lipids remain elevated with total cholesterol 217, LDL 120. Discussed that we will increase Atorvastatin from 40mg  to 80mg  daily. Hgb 11.1 with normal iron studies and ferritin. No need to start iron supplementation at this time.   She was able to pick up her Levalbuterol and Breo Ellipta inhalers and feels comfortable with their use. Patient has no questions or concerns at this time.  Jeralyn Bennett, PGY1 Internal Medicine Pager: [phone]

## 2020-06-22 ENCOUNTER — Telehealth: Payer: Self-pay | Admitting: *Deleted

## 2020-06-22 NOTE — Telephone Encounter (Addendum)
Information was sent to [facility] through CoverMyMeds for PA for Levalbuterol Tartate 45MCG /ACT Aerosol.  Awaiting determination within 3-7 days.  Sander Nephew, RN 06/22/2020 11:30 AM. Fax from [place] was approved for Levalbuterol Tar HFA 45 MCg Inhaler 06/22/2020 through 11/27/2020.  Sander Nephew, RN 06/23/2020 10:50 AM.

## 2020-06-26 ENCOUNTER — Encounter: Payer: Self-pay | Admitting: Family Medicine

## 2020-06-26 ENCOUNTER — Ambulatory Visit: Payer: Medicare HMO | Admitting: Family Medicine

## 2020-06-26 ENCOUNTER — Other Ambulatory Visit: Payer: Self-pay

## 2020-06-26 DIAGNOSIS — M170 Bilateral primary osteoarthritis of knee: Secondary | ICD-10-CM | POA: Diagnosis not present

## 2020-06-26 DIAGNOSIS — M1712 Unilateral primary osteoarthritis, left knee: Secondary | ICD-10-CM

## 2020-06-26 DIAGNOSIS — M1711 Unilateral primary osteoarthritis, right knee: Secondary | ICD-10-CM

## 2020-06-26 MED ORDER — GLUCOSAMINE SULFATE 1000 MG PO CAPS
1.0000 | ORAL_CAPSULE | Freq: Two times a day (BID) | ORAL | 3 refills | Status: DC
Start: 2020-06-26 — End: 2020-07-07

## 2020-06-26 MED ORDER — CELECOXIB 200 MG PO CAPS
200.0000 mg | ORAL_CAPSULE | Freq: Two times a day (BID) | ORAL | 6 refills | Status: DC | PRN
Start: 2020-06-26 — End: 2020-08-25

## 2020-06-26 MED ORDER — TURMERIC 500 MG PO CAPS: 500.0000 mg | ORAL_CAPSULE | Freq: Two times a day (BID) | ORAL | 3 refills | Status: DC

## 2020-06-26 NOTE — Progress Notes (Signed)
Office Visit Note   Patient: Kayla Roberts           Date of Birth: 03-03-1956           MRN: [id number] Visit Date: 06/26/2020 Requested by: Velna Ochs, MD [address] [place],  [place] [address] PCP: Velna Ochs, MD  Subjective: Chief Complaint  Patient presents with  . Right Knee - Pain    Knees started hurting badly again 3 months ago. Noticed it with walking. No swelling. Both knees pop. Occasional feeling that knees will give way. H/o cortisone and then gel injection series 2019 - these helped.  . Left Knee - Pain    HPI: She is here with bilateral knee pain.  She has a history of moderate to severe patellofemoral and medial compartment DJD in both knees.  In 2019 cortisone injections did not help, but she had excellent relief with gel injections.  They were pretty much pain-free until March of this year.  Both knees are bothering her on the anterior medial aspect.  Sometimes the left one feels like is going to give way.  She is taking Aleve with temporary relief.  She is hoping to proceed with gel injections again.               ROS:   All other systems were reviewed and are negative.  Objective: Vital Signs: There were no vitals taken for this visit.  Physical Exam:  General:  Alert and oriented, in no acute distress. Pulm:  Breathing unlabored. Psy:  Normal mood, congruent affect.  Knees: Trace effusion in both knees.  2+ patellofemoral crepitus bilaterally.  1+ laxity with valgus stress on the left but still a solid endpoint.  Both knees are tender on the medial joint line.   Imaging: No results found.  Assessment & Plan: 1.  Bilateral knee osteoarthritis -We will request approval for bilateral gel injections. -Trial of Celebrex as needed, glucosamine and turmeric over-the-counter.     Procedures: No procedures performed  No notes on file     PMFS History: Patient Active Problem List   Diagnosis Date Noted  . Severe persistent asthma  06/18/2020  . Paroxysmal atrial fibrillation ([place]) 04/02/2019  . Diabetes ([place]) 03/25/2019  . 2019 novel coronavirus disease (COVID-19) 03/23/2019  . Acute midline low back pain without sciatica 12/06/2018  . Post-viral cough syndrome 06/26/2018  . Internal hemorrhoid 10/13/2017  . Healthcare maintenance 10/17/2016  . Morbid obesity ([place]) 09/23/2016  . Primary localized osteoarthritis of knee 02/02/2016  . ANEMIA NOS 09/17/2007  . HYPERCHOLESTEROLEMIA 09/25/2006  . Major depression, chronic 09/25/2006  . Essential hypertension 09/25/2006  . GERD 09/25/2006   Past Medical History:  Diagnosis Date  . Anxiety   . Asthma   . Depression   . GERD (gastroesophageal reflux disease)   . Hyperlipidemia   . Hypertension   . Morbid obesity ([place])     Family History  Problem Relation Age of Onset  . Hypertension Mother   . Hyperlipidemia Mother   . Heart disease Father        unknown to pt/was in [place]  . Hyperlipidemia Father   . Hypertension Father   . Hypertension Brother   . Heart disease Brother   . Hypertension Daughter   . Diabetes Sister     Past Surgical History:  Procedure Laterality Date  . CERVICAL DISCECTOMY  2002  . COLONOSCOPY WITH PROPOFOL N/A 01/18/2019   Procedure: COLONOSCOPY WITH PROPOFOL;  Surgeon: Clarene Essex, MD;  Location: [facility];  Service: Endoscopy;  Laterality: N/A;  . POLYPECTOMY  01/18/2019   Procedure: POLYPECTOMY;  Surgeon: Clarene Essex, MD;  Location: [facility];  Service: Endoscopy;;   Social History   Occupational History  . Not on file  Tobacco Use  . Smoking status: Never Smoker  . Smokeless tobacco: Never Used  Vaping Use  . Vaping Use: Never used  Substance and Sexual Activity  . Alcohol use: No  . Drug use: No  . Sexual activity: Not on file

## 2020-06-29 ENCOUNTER — Telehealth: Payer: Self-pay

## 2020-06-29 NOTE — Telephone Encounter (Signed)
Submitted for VOB for Monovisc-Bilateral knee  

## 2020-06-29 NOTE — Telephone Encounter (Signed)
-----   Message from April Jackson, [place] sent at 06/26/2020  4:18 PM EDT ----- Can you submit this one as well?  Thank you. ----- Message ----- From: Eunice Blase, MD Sent: 06/26/2020   2:12 PM EDT To: April Jackson, RMA  Please request approval for bilateral knee gel injections for OA.

## 2020-06-30 ENCOUNTER — Telehealth: Payer: Self-pay

## 2020-06-30 NOTE — Telephone Encounter (Signed)
Tried calling pt to schedule, no answer and voice mail was full

## 2020-06-30 NOTE — Telephone Encounter (Signed)
Approved for Monovisc-Bilateral knee Dr. Junius Roads Buy and Bill No Copay No OOP [employer]here Josem Kaufmann # [id number] Dates: 06/30/20-11/30/20

## 2020-07-06 ENCOUNTER — Ambulatory Visit (INDEPENDENT_AMBULATORY_CARE_PROVIDER_SITE_OTHER): Payer: Medicare HMO | Admitting: Family Medicine

## 2020-07-06 ENCOUNTER — Encounter: Payer: Self-pay | Admitting: Family Medicine

## 2020-07-06 ENCOUNTER — Other Ambulatory Visit: Payer: Self-pay

## 2020-07-06 DIAGNOSIS — M170 Bilateral primary osteoarthritis of knee: Secondary | ICD-10-CM | POA: Diagnosis not present

## 2020-07-06 DIAGNOSIS — M1711 Unilateral primary osteoarthritis, right knee: Secondary | ICD-10-CM | POA: Diagnosis not present

## 2020-07-06 DIAGNOSIS — M1712 Unilateral primary osteoarthritis, left knee: Secondary | ICD-10-CM

## 2020-07-06 NOTE — Progress Notes (Signed)
Subjective: She is here for bilateral Monovisc injections.  Objective: Trace effusion in both knees.  Procedure: Bilateral knee Monovisc injections: After sterile prep with Betadine, injected 3 cc 1% lidocaine without epinephrine and Monovisc from superolateral approach, a flash of clear yellow synovial fluid was obtained prior to each injection confirming intra-articular placement.

## 2020-07-07 ENCOUNTER — Encounter: Payer: Self-pay | Admitting: Cardiology

## 2020-07-07 ENCOUNTER — Ambulatory Visit: Payer: Medicare HMO | Admitting: Cardiology

## 2020-07-07 VITALS — BP 144/86 | HR 85 | Resp 14 | Ht 65.0 in | Wt 309.1 lb

## 2020-07-07 DIAGNOSIS — I480 Paroxysmal atrial fibrillation: Secondary | ICD-10-CM

## 2020-07-07 DIAGNOSIS — I10 Essential (primary) hypertension: Secondary | ICD-10-CM

## 2020-07-07 NOTE — Progress Notes (Signed)
Subjective:   Kayla Roberts, female    DOB: 28-Aug-1956, 64 y.o.   MRN: [id number]  Chief complaint:  Atrial fibrillation  64 year old Caucasian female with hypertension, seasonal asthma, morbid obesity, COVID pneumonia in late 02/2019, paroxysmal atrial fibrillation.  Patient was supposed to undergo cardioversion in 04/2019, but was found to be in sinus rhythm.   Currently, she denies chest pain, shortness of breath, palpitations, leg edema, orthopnea, PND, TIA/syncope. Blood pressure elevated today but much lower at home, usually in 120s/80s.    Current Outpatient Medications on File Prior to Visit  Medication Sig Dispense Refill  . Accu-Chek FastClix Lancets MISC Use to check blood sugar 2 times daily. diag code E11.9. noninsulin dependent 204 each 1  . atorvastatin (LIPITOR) 80 MG tablet Take 1 tablet (80 mg total) by mouth daily. 90 tablet 3  . Blood Glucose Monitoring Suppl (ACCU-CHEK NANO SMARTVIEW) w/Device KIT Use to check blood sugar 2 times daily. diag code E11.9 Noninsulin dependent 1 kit 0  . celecoxib (CELEBREX) 200 MG capsule Take 1 capsule (200 mg total) by mouth 2 (two) times daily as needed. 60 capsule 6  . diltiazem (CARDIZEM CD) 180 MG 24 hr capsule TAKE 1 CAPSULE(180 MG) BY MOUTH TWICE DAILY 60 capsule 5  . ELIQUIS 5 MG TABS tablet TAKE 1 TABLET(5 MG) BY MOUTH TWICE DAILY 180 tablet 2  . Flaxseed, Linseed, (FLAX SEEDS PO) Take 1 tablet by mouth daily.    . fluticasone furoate-vilanterol (BREO ELLIPTA) 200-25 MCG/INH AEPB Inhale 1 puff into the lungs daily. 28 each 1  . Lancets Misc. (ACCU-CHEK FASTCLIX LANCET) KIT Use to check blood sugar 2 times daily. diag code E11.9. Noninsulin dependent 1 kit 0  . levalbuterol (XOPENEX HFA) 45 MCG/ACT inhaler Inhale 2 puffs into the lungs every 6 (six) hours as needed for wheezing or shortness of breath. 1 Inhaler 2  . metFORMIN (GLUCOPHAGE) 1000 MG tablet     . metoprolol tartrate (LOPRESSOR) 25 MG tablet TAKE 1 TABLET(25  MG) BY MOUTH TWICE DAILY 180 tablet 2  . QVAR REDIHALER 40 MCG/ACT inhaler INHALE 2 PUFFS INTO THE LUNGS TWICE DAILY (Patient taking differently: Inhale 2 puffs into the lungs as needed (shortness of breath). ) 10.6 g 5  . spironolactone (ALDACTONE) 50 MG tablet Take 1 tablet (50 mg total) by mouth daily. 90 tablet 2  . zinc sulfate 220 (50 Zn) MG capsule Take 1 capsule (220 mg total) by mouth daily. 7 capsule 0  . ferrous sulfate 325 (65 FE) MG tablet Take 1 tablet (325 mg total) by mouth every other day. 30 tablet 3  . Glucosamine Sulfate 1000 MG CAPS Take 1 capsule (1,000 mg total) by mouth 2 (two) times daily. (Patient not taking: Reported on 07/07/2020) 180 capsule 3  . PROCTOZONE-HC 2.5 % rectal cream Place 1 application rectally 2 (two) times daily as needed for hemorrhoids.     . Turmeric 500 MG CAPS Take 500 mg by mouth 2 (two) times daily. (Patient not taking: Reported on 07/07/2020) 180 capsule 3  . vitamin C (VITAMIN C) 500 MG tablet Take 1 tablet (500 mg total) by mouth daily. (Patient not taking: Reported on 07/07/2020) 7 tablet 0   No current facility-administered medications on file prior to visit.    Cardiovascular studies:  EKG 07/07/2020: Sinus rhythm 81 bpm Borderline left atrial enlargement, otherwise normal EKG  EKG 12/02/2019: Probable sinus rhythm 66 bpm with PAC's and sinus arrhtymia.   Lexiscan Myoview stress test 05/27/2019:  Lexiscan stress test was performed. LVEF 72%.  Small sized, mild intensity, decreased tracer uptake in basal inferolateral myocardium on stress images, most likely due to diaphragmatic attenuation. Low risk study.  EKG 03/28/2019: Afib w/RVR  Echocardiogram 03/31/2019: - Left ventricle: Global longitudinal LV strain is normal at -21.9% The cavity size was normal. There was mild concentric hypertrophy. Systolic function was normal. The estimated ejection fraction was in the range of 60% to 65%. Wall motion was normal; there were no  regional wall motion abnormalities. There was an increased relative contribution of atrial contraction to ventricular filling. Doppler parameters are consistent with abnormal left ventricular relaxation (grade 1 diastolic dysfunction). - Aortic valve: Trileaflet; mildly thickened, mildly calcified leaflets. - Mitral valve: Calcified annulus. - Tricuspid valve: There was trivial regurgitation. - Pulmonic valve: There was trivial regurgitation. - Pulmonary arteries: PA peak pressure: 32 mm Hg (S).  Recent labs: 06/18/2020: Glucose 95, BUN/Cr 10/0.85. EGFR 83. Na/K 143/4.7.  H/H 11/33. MCV 84. Platelets 283 HbA1C 5.6% Chol 217, TG 92, HDL 81, LDL 120   Review of Systems  Cardiovascular: Negative for chest pain, dyspnea on exertion, leg swelling, palpitations and syncope.  Respiratory: Positive for snoring.   Musculoskeletal: Positive for joint pain (B/l knees).         Vitals:   07/07/20 1525  BP: (!) 144/86  Pulse: 85  Resp: 14  SpO2: 98%     Objective:    Physical Exam Vitals and nursing note reviewed.  Constitutional:      General: She is not in acute distress.    Appearance: She is obese.  Neck:     Vascular: No JVD.  Cardiovascular:     Rate and Rhythm: Normal rate and regular rhythm.     Heart sounds: Normal heart sounds. No murmur heard.   Pulmonary:     Effort: Pulmonary effort is normal.     Breath sounds: Normal breath sounds. No wheezing or rales.         Assessment & Recommendations:   64 year old Caucasian female with hypertension, seasonal asthma, morbid obesity, COVID pneumonia in late 02/2019, new diagnosis of paroxysmal atrial fibrillation.  Paroxysmal Afib: In sinus rhythm today.  Continue metoprolol 25 mg bid, and diltiazem 180 mg.   CHA2DS2VAsc score 4, annual stroke risk 5% Continue Eliquis 5 mg bid.   Stress test with small area of inferolateral perfusion defect, possibly due to diaphragmatic attenuation. In absence of  angina symptoms, recommend continued medical management.  Continue lipitor. Will refer to sleep study.  Hypertension: Elevated today. Better controlled at home. No change made today. Continue f/u w/PCP  F/u w/me in 1 year.  Nigel Mormon, MD [facility] Pager: [phone] Office: [phone] If no answer Cell [phone]

## 2020-08-25 ENCOUNTER — Other Ambulatory Visit: Payer: Self-pay | Admitting: Family Medicine

## 2020-08-25 ENCOUNTER — Other Ambulatory Visit: Payer: Self-pay

## 2020-08-25 DIAGNOSIS — I10 Essential (primary) hypertension: Secondary | ICD-10-CM

## 2020-08-25 DIAGNOSIS — I4819 Other persistent atrial fibrillation: Secondary | ICD-10-CM

## 2020-08-25 MED ORDER — APIXABAN 5 MG PO TABS: 5.0000 mg | ORAL_TABLET | Freq: Two times a day (BID) | ORAL | 2 refills | Status: DC

## 2020-08-25 MED ORDER — SPIRONOLACTONE 50 MG PO TABS: 50.0000 mg | ORAL_TABLET | Freq: Every day | ORAL | 2 refills | Status: DC

## 2020-08-25 MED ORDER — CELECOXIB 200 MG PO CAPS
200.0000 mg | ORAL_CAPSULE | Freq: Two times a day (BID) | ORAL | 1 refills | Status: DC | PRN
Start: 2020-08-25 — End: 2021-01-04

## 2020-08-25 MED ORDER — METOPROLOL TARTRATE 25 MG PO TABS: 25.0000 mg | ORAL_TABLET | Freq: Two times a day (BID) | ORAL | 2 refills | Status: DC

## 2020-08-26 ENCOUNTER — Other Ambulatory Visit: Payer: Self-pay | Admitting: Internal Medicine

## 2020-08-26 ENCOUNTER — Other Ambulatory Visit: Payer: Self-pay | Admitting: *Deleted

## 2020-08-26 DIAGNOSIS — J4550 Severe persistent asthma, uncomplicated: Secondary | ICD-10-CM

## 2020-08-26 DIAGNOSIS — I4819 Other persistent atrial fibrillation: Secondary | ICD-10-CM

## 2020-08-26 MED ORDER — BREO ELLIPTA 200-25 MCG/INH IN AEPB: 1.0000 | INHALATION_SPRAY | Freq: Every day | RESPIRATORY_TRACT | 3 refills | Status: AC

## 2020-08-26 MED ORDER — DILTIAZEM HCL ER COATED BEADS 180 MG PO CP24: ORAL_CAPSULE | ORAL | 3 refills | Status: AC

## 2020-08-28 ENCOUNTER — Encounter: Payer: Self-pay | Admitting: Internal Medicine

## 2020-09-10 ENCOUNTER — Other Ambulatory Visit: Payer: Self-pay | Admitting: Cardiology

## 2020-09-10 DIAGNOSIS — I10 Essential (primary) hypertension: Secondary | ICD-10-CM

## 2020-09-16 ENCOUNTER — Telehealth: Payer: Self-pay

## 2020-09-16 NOTE — Telephone Encounter (Signed)
Kayla Roberts with [facility] requesting to speak with a nurse. Please call pt back.

## 2020-09-16 NOTE — Telephone Encounter (Signed)
Returned call to Mickel Baas at [facility]. States patient recently moved to [place] but has not established care there yet. requesting 90 day refills on all her meds. Explained that 90 day refill was sent to [facility] on 08/26/2020 for diltiazem and Breo. She will check with her manager and call back if needed. Hubbard Hartshorn, BSN, RN-BC '

## 2020-09-24 ENCOUNTER — Telehealth: Payer: Self-pay

## 2020-10-01 ENCOUNTER — Other Ambulatory Visit: Payer: Self-pay | Admitting: *Deleted

## 2020-10-01 NOTE — Telephone Encounter (Signed)
Pt has apparently moved to another [place], scripts sent to [facility]. Attempted to call pt, no answer

## 2020-10-05 NOTE — Telephone Encounter (Signed)
Talked to pt who stated she has been staying in [place] to help her daughter who had twins. Stated she is using [facility]. We have been receiving many refill requests from [facility] - informed them pt is not using their pharmacy (via fax).

## 2020-10-26 ENCOUNTER — Other Ambulatory Visit: Payer: Self-pay | Admitting: Internal Medicine

## 2020-10-26 DIAGNOSIS — E119 Type 2 diabetes mellitus without complications: Secondary | ICD-10-CM

## 2021-01-04 ENCOUNTER — Other Ambulatory Visit: Payer: Self-pay | Admitting: Family Medicine

## 2021-01-07 NOTE — Addendum Note (Signed)
Addended by: Hulan Fray on: 01/07/2021 02:27 PM   Modules accepted: Orders

## 2021-03-15 ENCOUNTER — Other Ambulatory Visit: Payer: Self-pay | Admitting: Cardiology

## 2021-03-15 DIAGNOSIS — I4819 Other persistent atrial fibrillation: Secondary | ICD-10-CM

## 2021-03-15 DIAGNOSIS — I10 Essential (primary) hypertension: Secondary | ICD-10-CM

## 2021-03-16 ENCOUNTER — Encounter: Payer: Self-pay | Admitting: *Deleted

## 2021-03-16 NOTE — Progress Notes (Signed)

## 2021-03-19 ENCOUNTER — Other Ambulatory Visit: Payer: Self-pay | Admitting: Internal Medicine

## 2021-03-19 DIAGNOSIS — E119 Type 2 diabetes mellitus without complications: Secondary | ICD-10-CM

## 2021-03-23 NOTE — Progress Notes (Signed)
Things That May Be Affecting Your Health:  Alcohol  Hearing loss  Pain    Depression  Home Safety  Sexual Health   Diabetes  Lack of physical activity  Stress   Difficulty with daily activities  Loneliness  Tiredness   Drug use  Medicines  Tobacco use   Falls  Motor Vehicle Safety  Weight   Food choices  Oral Health  Other    YOUR PERSONALIZED HEALTH PLAN : 1. Schedule your next subsequent Medicare Wellness visit in one year 2. Attend all of your regular appointments to address your medical issues 3. Complete the preventative screenings and services   Annual Wellness Visit   Medicare Covered Preventative Screenings and [place] Men and Women Who How Often Need? Date of Last Service Action  Abdominal Aortic Aneurysm Adults with AAA risk factors Once      Alcohol Misuse and Counseling All Adults Screening once a year if no alcohol misuse. Counseling up to 4 face to face sessions.     Bone Density Measurement  Adults at risk for osteoporosis Once every 2 yrs Yes    Please offer  Lipid Panel Z13.6 All adults without CV disease Once every 5 yrs       Colorectal Cancer   Stool sample or  Colonoscopy All adults 56 and older   Once every year  Every 10 years        Depression All Adults Once a year  Today   Diabetes Screening Blood glucose, post glucose load, or GTT Z13.1  All adults at risk  Pre-diabetics  Once per year  Twice per year      Diabetes  Self-Management Training All adults Diabetics 10 hrs first year; 2 hours subsequent years. Requires Copay Yes   Please offer  Glaucoma  Diabetics  Family history of glaucoma  African Americans 48 yrs +  Hispanic Americans 72 yrs + Annually - requires coppay      Hepatitis C Z72.89 or F19.20  High Risk for HCV  Born between 1945 and 1965  Annually  Once      HIV Z11.4 All adults based on risk  Annually btw ages 60 & 34 regardless of risk  Annually > 65 yrs if at increased risk       Lung Cancer Screening Asymptomatic adults aged 62-77 with 30 pack yr history and current smoker OR quit within the last 15 yrs Annually Must have counseling and shared decision making documentation before first screen      Medical Nutrition Therapy Adults with   Diabetes  Renal disease  Kidney transplant within past 3 yrs 3 hours first year; 2 hours subsequent years     Obesity and Counseling All adults Screening once a year Counseling if BMI 30 or higher  Today   Tobacco Use Counseling Adults who use tobacco  Up to 8 visits in one year     Vaccines Z23  Hepatitis B  Influenza   Pneumonia  Adults   Once  Once every flu season  Two different vaccines separated by one year     Next Annual Wellness Visit People with Medicare Every year  Today     Services & Screenings Women Who How Often Need  Date of Last Service Action  Mammogram  Z12.31 Women over 2 One baseline ages 22-39. Annually ager 45 yrs+ Yes    Ordered at prior visit but not done   Pap tests All women Annually if high risk.  Every 2 yrs for normal risk women      Screening for cervical cancer with   Pap (Z01.419 nl or Z01.411abnl) &  HPV Z11.51 Women aged 53 to 68 Once every 5 yrs     Screening pelvic and breast exams All women Annually if high risk. Every 2 yrs for normal risk women     Sexually Transmitted Diseases  Chlamydia  Gonorrhea  Syphilis All at risk adults Annually for non pregnant females at increased risk         [place] Men Who How Ofter Need  Date of Last Service Action  Prostate Cancer - DRE & PSA Men over 50 Annually.  DRE might require a copay.        Sexually Transmitted Diseases  Syphilis All at risk adults Annually for men at increased risk      Health Maintenance List Health Maintenance  Topic Date Due  . OPHTHALMOLOGY EXAM  Never done  . MAMMOGRAM  12/19/2018  . DEXA SCAN  Never done  . FOOT EXAM  12/10/2020  . URINE MICROALBUMIN  12/10/2020  .  HEMOGLOBIN A1C  12/19/2020  . INFLUENZA VACCINE  06/28/2021  . TETANUS/TDAP  10/08/2028  . COLONOSCOPY (Pts 45-37yrs Insurance coverage will need to be confirmed)  01/18/2029  . COVID-19 Vaccine  Completed  . Hepatitis C Screening  Completed  . PNA vac Low Risk Adult  Completed  . HPV VACCINES  Aged Out

## 2021-05-17 ENCOUNTER — Other Ambulatory Visit: Payer: Self-pay | Admitting: Internal Medicine

## 2021-05-17 DIAGNOSIS — E119 Type 2 diabetes mellitus without complications: Secondary | ICD-10-CM

## 2021-05-19 NOTE — Telephone Encounter (Signed)
Approved 90 day refill of metformin. Has not been seen in almost a year. Please have her schedule follow up with me next available.

## 2021-06-01 ENCOUNTER — Encounter: Payer: Self-pay | Admitting: *Deleted

## 2021-06-30 ENCOUNTER — Encounter: Payer: Medicare HMO | Admitting: Internal Medicine

## 2021-07-07 ENCOUNTER — Ambulatory Visit: Payer: Medicare HMO | Admitting: Cardiology

## 2021-07-07 DIAGNOSIS — E0590 Thyrotoxicosis, unspecified without thyrotoxic crisis or storm: Secondary | ICD-10-CM | POA: Insufficient documentation

## 2021-07-07 NOTE — Progress Notes (Signed)
Error

## 2021-08-04 ENCOUNTER — Encounter: Payer: Self-pay | Admitting: Internal Medicine

## 2022-01-26 ENCOUNTER — Other Ambulatory Visit: Payer: Self-pay | Admitting: Cardiology

## 2022-01-26 DIAGNOSIS — I4819 Other persistent atrial fibrillation: Secondary | ICD-10-CM

## 2023-01-30 ENCOUNTER — Telehealth: Payer: Self-pay | Admitting: Dietician

## 2023-01-30 NOTE — Telephone Encounter (Signed)
Calling to see if patient would like to continue care at our office  No answer. Unable to leave message.
